# Patient Record
Sex: Female | Born: 1935 | Race: White | Hispanic: No | Smoking: Former smoker
Health system: Southern US, Community
[De-identification: ages and names within clinical notes are randomized; demographics above are authoritative.]

## PROBLEM LIST (undated history)

## (undated) DIAGNOSIS — F32A Depression, unspecified: Secondary | ICD-10-CM

## (undated) DIAGNOSIS — R0600 Dyspnea, unspecified: Secondary | ICD-10-CM

## (undated) DIAGNOSIS — E039 Hypothyroidism, unspecified: Secondary | ICD-10-CM

## (undated) DIAGNOSIS — F329 Major depressive disorder, single episode, unspecified: Secondary | ICD-10-CM

## (undated) DIAGNOSIS — M1990 Unspecified osteoarthritis, unspecified site: Secondary | ICD-10-CM

## (undated) DIAGNOSIS — E785 Hyperlipidemia, unspecified: Secondary | ICD-10-CM

## (undated) DIAGNOSIS — I4891 Unspecified atrial fibrillation: Secondary | ICD-10-CM

## (undated) DIAGNOSIS — F419 Anxiety disorder, unspecified: Secondary | ICD-10-CM

## (undated) DIAGNOSIS — R350 Frequency of micturition: Secondary | ICD-10-CM

## (undated) DIAGNOSIS — R112 Nausea with vomiting, unspecified: Secondary | ICD-10-CM

## (undated) DIAGNOSIS — S22080A Wedge compression fracture of T11-T12 vertebra, initial encounter for closed fracture: Secondary | ICD-10-CM

## (undated) DIAGNOSIS — K219 Gastro-esophageal reflux disease without esophagitis: Secondary | ICD-10-CM

## (undated) DIAGNOSIS — I499 Cardiac arrhythmia, unspecified: Secondary | ICD-10-CM

## (undated) DIAGNOSIS — I10 Essential (primary) hypertension: Secondary | ICD-10-CM

## (undated) HISTORY — DX: Hyperlipidemia, unspecified: E78.5

## (undated) HISTORY — PX: TONSILLECTOMY: SUR1361

## (undated) HISTORY — DX: Nausea with vomiting, unspecified: R11.2

## (undated) HISTORY — PX: COLONOSCOPY: SHX174

## (undated) HISTORY — PX: UPPER GASTROINTESTINAL ENDOSCOPY: SHX188

## (undated) HISTORY — DX: Hypothyroidism, unspecified: E03.9

---

## 1998-07-02 ENCOUNTER — Ambulatory Visit (HOSPITAL_COMMUNITY): Admission: RE | Admit: 1998-07-02 | Discharge: 1998-07-02 | Payer: Self-pay | Admitting: Family Medicine

## 1998-12-13 ENCOUNTER — Other Ambulatory Visit: Admission: RE | Admit: 1998-12-13 | Discharge: 1998-12-13 | Payer: Self-pay | Admitting: Family Medicine

## 1999-11-25 ENCOUNTER — Other Ambulatory Visit: Admission: RE | Admit: 1999-11-25 | Discharge: 1999-11-25 | Payer: Self-pay | Admitting: Family Medicine

## 2000-02-14 ENCOUNTER — Encounter: Payer: Self-pay | Admitting: Family Medicine

## 2000-02-14 ENCOUNTER — Encounter: Admission: RE | Admit: 2000-02-14 | Discharge: 2000-02-14 | Payer: Self-pay | Admitting: Family Medicine

## 2000-07-08 ENCOUNTER — Ambulatory Visit (HOSPITAL_COMMUNITY): Admission: RE | Admit: 2000-07-08 | Discharge: 2000-07-08 | Payer: Self-pay | Admitting: Gastroenterology

## 2000-12-01 ENCOUNTER — Other Ambulatory Visit: Admission: RE | Admit: 2000-12-01 | Discharge: 2000-12-01 | Payer: Self-pay | Admitting: Family Medicine

## 2001-03-17 ENCOUNTER — Encounter: Payer: Self-pay | Admitting: Family Medicine

## 2001-03-17 ENCOUNTER — Encounter: Admission: RE | Admit: 2001-03-17 | Discharge: 2001-03-17 | Payer: Self-pay | Admitting: Family Medicine

## 2001-12-02 ENCOUNTER — Other Ambulatory Visit: Admission: RE | Admit: 2001-12-02 | Discharge: 2001-12-02 | Payer: Self-pay | Admitting: Family Medicine

## 2002-03-18 ENCOUNTER — Encounter: Admission: RE | Admit: 2002-03-18 | Discharge: 2002-03-18 | Payer: Self-pay | Admitting: Family Medicine

## 2002-03-18 ENCOUNTER — Encounter: Payer: Self-pay | Admitting: Family Medicine

## 2002-12-05 ENCOUNTER — Other Ambulatory Visit: Admission: RE | Admit: 2002-12-05 | Discharge: 2002-12-05 | Payer: Self-pay | Admitting: Family Medicine

## 2003-04-20 ENCOUNTER — Emergency Department (HOSPITAL_COMMUNITY): Admission: EM | Admit: 2003-04-20 | Discharge: 2003-04-20 | Payer: Self-pay | Admitting: Emergency Medicine

## 2003-04-20 ENCOUNTER — Encounter: Payer: Self-pay | Admitting: Emergency Medicine

## 2003-06-22 ENCOUNTER — Encounter: Payer: Self-pay | Admitting: Family Medicine

## 2003-06-22 ENCOUNTER — Encounter: Admission: RE | Admit: 2003-06-22 | Discharge: 2003-06-22 | Payer: Self-pay | Admitting: Family Medicine

## 2004-01-18 ENCOUNTER — Encounter: Admission: RE | Admit: 2004-01-18 | Discharge: 2004-01-18 | Payer: Self-pay | Admitting: Family Medicine

## 2004-03-15 ENCOUNTER — Encounter: Admission: RE | Admit: 2004-03-15 | Discharge: 2004-03-15 | Payer: Self-pay | Admitting: Family Medicine

## 2004-06-05 ENCOUNTER — Other Ambulatory Visit: Admission: RE | Admit: 2004-06-05 | Discharge: 2004-06-05 | Payer: Self-pay | Admitting: Family Medicine

## 2004-07-15 ENCOUNTER — Ambulatory Visit (HOSPITAL_COMMUNITY): Admission: RE | Admit: 2004-07-15 | Discharge: 2004-07-15 | Payer: Self-pay | Admitting: Family Medicine

## 2005-06-10 ENCOUNTER — Other Ambulatory Visit: Admission: RE | Admit: 2005-06-10 | Discharge: 2005-06-10 | Payer: Self-pay | Admitting: Family Medicine

## 2005-08-21 ENCOUNTER — Encounter: Admission: RE | Admit: 2005-08-21 | Discharge: 2005-08-21 | Payer: Self-pay | Admitting: Family Medicine

## 2005-09-30 ENCOUNTER — Emergency Department (HOSPITAL_COMMUNITY): Admission: EM | Admit: 2005-09-30 | Discharge: 2005-09-30 | Payer: Self-pay | Admitting: Emergency Medicine

## 2006-04-14 HISTORY — PX: NM MYOCAR PERF WALL MOTION: HXRAD629

## 2006-09-01 ENCOUNTER — Ambulatory Visit: Payer: Self-pay | Admitting: Gastroenterology

## 2006-09-21 ENCOUNTER — Ambulatory Visit: Payer: Self-pay | Admitting: Internal Medicine

## 2006-09-21 ENCOUNTER — Ambulatory Visit (HOSPITAL_COMMUNITY): Admission: RE | Admit: 2006-09-21 | Discharge: 2006-09-21 | Payer: Self-pay | Admitting: Internal Medicine

## 2006-09-21 ENCOUNTER — Encounter (INDEPENDENT_AMBULATORY_CARE_PROVIDER_SITE_OTHER): Payer: Self-pay | Admitting: Specialist

## 2006-10-13 ENCOUNTER — Encounter: Admission: RE | Admit: 2006-10-13 | Discharge: 2006-10-13 | Payer: Self-pay | Admitting: Family Medicine

## 2007-08-22 ENCOUNTER — Emergency Department (HOSPITAL_COMMUNITY): Admission: EM | Admit: 2007-08-22 | Discharge: 2007-08-22 | Payer: Self-pay | Admitting: Emergency Medicine

## 2007-08-31 ENCOUNTER — Ambulatory Visit (HOSPITAL_COMMUNITY): Admission: RE | Admit: 2007-08-31 | Discharge: 2007-08-31 | Payer: Self-pay | Admitting: Family Medicine

## 2007-10-20 ENCOUNTER — Ambulatory Visit (HOSPITAL_COMMUNITY): Admission: RE | Admit: 2007-10-20 | Discharge: 2007-10-20 | Payer: Self-pay | Admitting: Family Medicine

## 2007-11-12 ENCOUNTER — Encounter: Admission: RE | Admit: 2007-11-12 | Discharge: 2007-11-12 | Payer: Self-pay | Admitting: Family Medicine

## 2007-12-03 ENCOUNTER — Ambulatory Visit (HOSPITAL_COMMUNITY): Admission: RE | Admit: 2007-12-03 | Discharge: 2007-12-03 | Payer: Self-pay | Admitting: Family Medicine

## 2007-12-07 ENCOUNTER — Ambulatory Visit (HOSPITAL_COMMUNITY): Admission: RE | Admit: 2007-12-07 | Discharge: 2007-12-07 | Payer: Self-pay | Admitting: Family Medicine

## 2008-07-31 ENCOUNTER — Other Ambulatory Visit: Admission: RE | Admit: 2008-07-31 | Discharge: 2008-07-31 | Payer: Self-pay | Admitting: Obstetrics & Gynecology

## 2009-03-22 ENCOUNTER — Emergency Department (HOSPITAL_COMMUNITY): Admission: EM | Admit: 2009-03-22 | Discharge: 2009-03-23 | Payer: Self-pay | Admitting: Emergency Medicine

## 2009-04-03 ENCOUNTER — Ambulatory Visit (HOSPITAL_COMMUNITY): Admission: RE | Admit: 2009-04-03 | Discharge: 2009-04-03 | Payer: Self-pay | Admitting: Family Medicine

## 2009-09-04 ENCOUNTER — Ambulatory Visit (HOSPITAL_COMMUNITY): Admission: RE | Admit: 2009-09-04 | Discharge: 2009-09-04 | Payer: Self-pay | Admitting: Advanced Practice Midwife

## 2009-09-06 ENCOUNTER — Ambulatory Visit (HOSPITAL_COMMUNITY): Admission: RE | Admit: 2009-09-06 | Discharge: 2009-09-06 | Payer: Self-pay | Admitting: Family Medicine

## 2009-09-18 ENCOUNTER — Other Ambulatory Visit: Admission: RE | Admit: 2009-09-18 | Discharge: 2009-09-18 | Payer: Self-pay | Admitting: Obstetrics & Gynecology

## 2010-03-04 DIAGNOSIS — K219 Gastro-esophageal reflux disease without esophagitis: Secondary | ICD-10-CM | POA: Insufficient documentation

## 2010-03-04 DIAGNOSIS — E785 Hyperlipidemia, unspecified: Secondary | ICD-10-CM | POA: Insufficient documentation

## 2010-03-04 DIAGNOSIS — E7800 Pure hypercholesterolemia, unspecified: Secondary | ICD-10-CM | POA: Insufficient documentation

## 2010-03-04 DIAGNOSIS — F411 Generalized anxiety disorder: Secondary | ICD-10-CM | POA: Insufficient documentation

## 2010-03-04 DIAGNOSIS — Z8719 Personal history of other diseases of the digestive system: Secondary | ICD-10-CM | POA: Insufficient documentation

## 2010-03-05 ENCOUNTER — Ambulatory Visit: Payer: Self-pay | Admitting: Gastroenterology

## 2010-03-05 DIAGNOSIS — R1032 Left lower quadrant pain: Secondary | ICD-10-CM | POA: Insufficient documentation

## 2010-06-26 ENCOUNTER — Encounter (INDEPENDENT_AMBULATORY_CARE_PROVIDER_SITE_OTHER): Payer: Self-pay

## 2010-08-14 ENCOUNTER — Ambulatory Visit (HOSPITAL_COMMUNITY): Admission: RE | Admit: 2010-08-14 | Discharge: 2010-08-14 | Payer: Self-pay | Admitting: Internal Medicine

## 2010-08-22 ENCOUNTER — Ambulatory Visit: Payer: Self-pay | Admitting: Gastroenterology

## 2010-08-22 DIAGNOSIS — R1310 Dysphagia, unspecified: Secondary | ICD-10-CM | POA: Insufficient documentation

## 2010-08-27 ENCOUNTER — Inpatient Hospital Stay (HOSPITAL_COMMUNITY): Admission: EM | Admit: 2010-08-27 | Discharge: 2010-08-28 | Payer: Self-pay | Admitting: Emergency Medicine

## 2010-08-27 ENCOUNTER — Ambulatory Visit: Payer: Self-pay | Admitting: Family Medicine

## 2010-09-12 ENCOUNTER — Ambulatory Visit: Payer: Self-pay | Admitting: Family Medicine

## 2010-09-12 DIAGNOSIS — I4891 Unspecified atrial fibrillation: Secondary | ICD-10-CM | POA: Insufficient documentation

## 2010-09-12 DIAGNOSIS — R631 Polydipsia: Secondary | ICD-10-CM | POA: Insufficient documentation

## 2010-09-30 ENCOUNTER — Encounter (INDEPENDENT_AMBULATORY_CARE_PROVIDER_SITE_OTHER): Payer: Self-pay | Admitting: *Deleted

## 2010-12-24 ENCOUNTER — Inpatient Hospital Stay (HOSPITAL_COMMUNITY): Admission: AD | Admit: 2010-12-24 | Discharge: 2010-12-25 | Payer: Self-pay | Source: Home / Self Care

## 2010-12-24 ENCOUNTER — Ambulatory Visit (HOSPITAL_COMMUNITY)
Admission: RE | Admit: 2010-12-24 | Discharge: 2010-12-24 | Payer: Self-pay | Source: Home / Self Care | Attending: Family Medicine | Admitting: Family Medicine

## 2010-12-30 LAB — CBC
HCT: 38.4 % (ref 36.0–46.0)
Hemoglobin: 13.2 g/dL (ref 12.0–15.0)
MCH: 30.2 pg (ref 26.0–34.0)
MCHC: 34.4 g/dL (ref 30.0–36.0)
MCV: 87.9 fL (ref 78.0–100.0)
Platelets: 269 10*3/uL (ref 150–400)
RBC: 4.37 MIL/uL (ref 3.87–5.11)
RDW: 13.5 % (ref 11.5–15.5)
WBC: 7.7 10*3/uL (ref 4.0–10.5)

## 2010-12-30 LAB — DIFFERENTIAL
Basophils Absolute: 0 10*3/uL (ref 0.0–0.1)
Basophils Relative: 0 % (ref 0–1)
Eosinophils Absolute: 0.2 10*3/uL (ref 0.0–0.7)
Eosinophils Relative: 2 % (ref 0–5)
Lymphocytes Relative: 29 % (ref 12–46)
Lymphs Abs: 2.2 10*3/uL (ref 0.7–4.0)
Monocytes Absolute: 0.5 10*3/uL (ref 0.1–1.0)
Monocytes Relative: 6 % (ref 3–12)
Neutro Abs: 4.8 10*3/uL (ref 1.7–7.7)
Neutrophils Relative %: 63 % (ref 43–77)

## 2010-12-30 LAB — EXPECTORATED SPUTUM ASSESSMENT W REFEX TO RESP CULTURE

## 2010-12-30 LAB — TSH: TSH: 2.134 u[IU]/mL (ref 0.350–4.500)

## 2010-12-30 LAB — CULTURE, BLOOD (ROUTINE X 2)
Culture: NO GROWTH
Culture: NO GROWTH

## 2010-12-30 LAB — COMPREHENSIVE METABOLIC PANEL
ALT: 12 U/L (ref 0–35)
AST: 20 U/L (ref 0–37)
Albumin: 3.8 g/dL (ref 3.5–5.2)
Alkaline Phosphatase: 91 U/L (ref 39–117)
BUN: 12 mg/dL (ref 6–23)
CO2: 29 mEq/L (ref 19–32)
Calcium: 9.3 mg/dL (ref 8.4–10.5)
Chloride: 105 mEq/L (ref 96–112)
Creatinine, Ser: 0.82 mg/dL (ref 0.4–1.2)
GFR calc Af Amer: >60 mL/min (ref >60)
GFR calc non Af Amer: >60 mL/min (ref >60)
Glucose, Bld: 92 mg/dL (ref 70–99)
Potassium: 4.2 mEq/L (ref 3.5–5.1)
Sodium: 141 mEq/L (ref 135–145)
Total Bilirubin: 0.8 mg/dL (ref 0.3–1.2)
Total Protein: 7.4 g/dL (ref 6.0–8.3)

## 2010-12-30 LAB — LEGIONELLA ANTIGEN, URINE: Legionella Antigen, Urine: NEGATIVE

## 2010-12-30 LAB — APTT: aPTT: 50 seconds — ABNORMAL HIGH (ref 24–37)

## 2010-12-30 LAB — STREP PNEUMONIAE URINARY ANTIGEN: Strep Pneumo Urinary Antigen: NEGATIVE

## 2010-12-30 LAB — EXPECTORATED SPUTUM ASSESSMENT W GRAM STAIN, RFLX TO RESP C

## 2010-12-30 LAB — PROTIME-INR
INR: 2.02 — ABNORMAL HIGH (ref 0.00–1.49)
INR: 2.19 — ABNORMAL HIGH (ref 0.00–1.49)
Prothrombin Time: 23 seconds — ABNORMAL HIGH (ref 11.6–15.2)
Prothrombin Time: 24.5 seconds — ABNORMAL HIGH (ref 11.6–15.2)

## 2010-12-30 LAB — CULTURE, RESPIRATORY W GRAM STAIN: Culture: NORMAL

## 2010-12-30 LAB — MRSA PCR SCREENING: MRSA by PCR: NEGATIVE

## 2011-01-05 ENCOUNTER — Encounter: Payer: Self-pay | Admitting: Family Medicine

## 2011-01-16 NOTE — Assessment & Plan Note (Signed)
Summary: GERD, DYSPHAGIA   Visit Type:  Follow-up Visit Primary Care Provider:  [name], M.D.  Chief Complaint:  knot on stomach.  History of Present Illness: Went to beach and had vomiting. ?got food poisoning and haven't felt good since. Bruise on side of stomach and has a black eye and a knot. Hit her head and side  ~2 weeks. Felt nauseous his AM. Nothing taste right. Saw Dr. [name] and Gerarda Fraction. Last vomited: SUN. No diarrhea since 2 weeks. Last BM:? Took a laxative. Still on Coumadin and ASA.  Pt wanted me to look at her bruises.  Current Medications (verified): 1)  Zocor 10 Mg Tabs (Simvastatin) .... Once Daily 2)  Aspirin 81 Mg Tabs (Aspirin) .... Once Daily As Needed 3)  Omeprazole 20 Mg Cpdr (Omeprazole) .... Once Daily 4)  Lorazepam 0.5 Mg Tabs (Lorazepam) .... Once Daily 5)  Lidoderm 5 % Ptch (Lidocaine) .... Top Q12h. Remove After 12 Hours. 6)  Metoprolol Succinate 25 Mg Xr24h-Tab (Metoprolol Succinate) .... Once Daily 7)  Levothyroxine Sodium 50 Mcg Tabs (Levothyroxine Sodium) .... Once Daily 8)  Coumadin 3 Mg Tabs (Warfarin Sodium) .... Uad 9)  Simvastatin 40 Mg Tabs (Simvastatin) .... Once Daily 10)  Cephalexin 500 Mg Caps (Cephalexin) .... Qid  Allergies (verified): 1)  ! Sulfa 2)  ! Codeine  Past History:  Past Medical History: Last updated: 03/05/2010 GERD Hyperlipidemia TCS 2007: simple adenoma, DC/[place] DIVERTICULOSIS Dysphagia-EGD/dilation 2007-NUR 54 Fr Maloney  Past Surgical History: Last updated: 03/04/2010 TONSILLECTOMY  Vital Signs:  Patient profile:   77 year old female Height:      64 inches Weight:      150 pounds BMI:     25.84 Temp:     97.8 degrees F oral Pulse rate:   60 / minute BP sitting:   128 / 78  (right arm) Cuff size:   regular  Vitals Entered By: Burnadette Peter LPN (September  8, 624THL 2:59 PM)  Physical Exam  General:  Well developed, well nourished, no acute distress. Head:  Normocephalic, ecchymosis and TTP on R fronatl  scalp and R eye Eyes:  PERRL, no icterus. Lungs:  Clear throughout to auscultation. Heart:  Regular rate and rhythm; no murmurs. Abdomen:  Soft, nontender and nondistended. Normal bowel sounds. Old bruise on rRLQ Msk:  Old cruise on RUE Extremities:  No edema noted. Neurologic:  Alert and  oriented x4;  grossly normal neurologically.  Impression & Recommendations:  Problem # 1:  GERD (ICD-530.81) Assessment Improved Continue OMP. OPV in 6 mos.  Problem # 2:  DYSPHAGIA (ICD-787.20) Assessment: Improved No indication for EGD.  CC: PCP  Appended Document: GERD, DYSPHAGIA 6 MONTH F/U OPV IS IN THE COMPUTER  Appended Document: Orders Update    Clinical Lists Changes  Orders: Added new Service order of Est. Patient Level II [id number]) - Signed

## 2011-01-16 NOTE — Letter (Signed)
Summary: Unable to Reach, Consult Scheduled  [facility]  [address]   [place], [place] [address]   Phone: [phone]  Fax: [phone]    09/30/2010  Leslie Rosario [address] [place], [place]  [address] [birth year]   Dear Ms. Stoltz,   We have been unable to reach you by phone.  Please contact our office with an updated phone number.  At the recommendation of DR [facility]  we have been asked to schedule you a consult with DR FIELDS for GERD/ACID REFLUX.   Please call our office at [phone].     Thank you,    [facility] R. Garfield Cornea, M.D.    Barney Drain, M.D. Vickey Huger, FNP-BC    Neil Crouch, PA-C Phone: [phone]    Fax: [phone]

## 2011-01-16 NOTE — Letter (Signed)
Summary: Recall Office Visit  [facility]  [address]   [place], [place] [address]   Phone: [phone]  Fax: [phone]      June 26, 2010   Leslie Rosario [address] [place], [place]  [address] 13-Feb-1923   Dear Ms. Lusty,   According to our records, it is time for you to schedule a follow-up office visit with [name].   At your convenience, please call [phone] to schedule an office visit. If you have any questions, concerns, or feel that this letter is in error, we would appreciate your call.   Sincerely,    Burnadette Peter LPN  [facility] Ph: [phone]   Fax: [phone]

## 2011-01-16 NOTE — Assessment & Plan Note (Signed)
Summary: hfu,df   Vital Signs:  Patient profile:   77 year old female Height:      64 inches Weight:      142.9 pounds BMI:     24.62 Temp:     98.0 degrees F oral Pulse rate:   69 / minute BP sitting:   113 / 57  (left arm) Cuff size:   regular  Vitals Entered By: Levert Feinstein LPN (September 29, 624THL 2:48 PM) CC: hfu Is Patient Diabetic? No Pain Assessment Patient in pain? no        Primary Provider:  [name], M.D.  CC:  hfu.  History of Present Illness: pt. is an 77 y/o c/f with history of syncope from vasovagal response. Patient suffered a two weak long likely viral illness with vomiting and diarrhea. she was seen in the hospital because of syncopal episode and nasal fracture. patient has a medical history of atrial fibrilliation on coumadin. she looks good today. did not require any refills. her nose is healing well.  Preventive Screening-Counseling & Management  Alcohol-Tobacco     Smoking Status: never  Problems Prior to Update: 1)  Polydipsia  (ICD-783.5) 2)  Dysphagia  (ICD-787.20) 3)  Abdominal Pain, Left Lower Quadrant  (ICD-789.04) 4)  Hiatal Hernia, Hx of  (ICD-V12.79) 5)  Hypercholesterolemia  (ICD-272.0) 6)  Diverticulosis  () 7)  Hyperlipidemia  (ICD-272.4) 8)  Hx of Anxiety  (ICD-300.00) 9)  Gerd  (ICD-530.81)  Current Problems (verified): 1)  Atrial Fibrillation  (ICD-427.31) 2)  Polydipsia  (ICD-783.5) 3)  Dysphagia  (ICD-787.20) 4)  Abdominal Pain, Left Lower Quadrant  (ICD-789.04) 5)  Hiatal Hernia, Hx of  (ICD-V12.79) 6)  Hypercholesterolemia  (ICD-272.0) 7)  Diverticulosis  () 8)  Hyperlipidemia  (ICD-272.4) 9)  Hx of Anxiety  (ICD-300.00) 10)  Gerd  (ICD-530.81)  Medications Prior to Update: 1)  Zocor 10 Mg Tabs (Simvastatin) .... Once Daily 2)  Aspirin 81 Mg Tabs (Aspirin) .... Once Daily As Needed 3)  Omeprazole 20 Mg Cpdr (Omeprazole) .... Once Daily 4)  Lorazepam 0.5 Mg Tabs (Lorazepam) .... Once Daily 5)  Lidoderm 5 % Ptch  (Lidocaine) .... Top Q12h. Remove After 12 Hours. 6)  Metoprolol Succinate 25 Mg Xr24h-Tab (Metoprolol Succinate) .... Once Daily 7)  Levothyroxine Sodium 50 Mcg Tabs (Levothyroxine Sodium) .... Once Daily 8)  Coumadin 3 Mg Tabs (Warfarin Sodium) .... Uad 9)  Simvastatin 40 Mg Tabs (Simvastatin) .... Once Daily 10)  Cephalexin 500 Mg Caps (Cephalexin) .... Qid  Current Medications (verified): 1)  Zocor 10 Mg Tabs (Simvastatin) .... Once Daily 2)  Aspirin 81 Mg Tabs (Aspirin) .... Once Daily As Needed 3)  Omeprazole 20 Mg Cpdr (Omeprazole) .... Once Daily 4)  Lorazepam 0.5 Mg Tabs (Lorazepam) .... Once Daily 5)  Lidoderm 5 % Ptch (Lidocaine) .... Top Q12h. Remove After 12 Hours. 6)  Metoprolol Succinate 25 Mg Xr24h-Tab (Metoprolol Succinate) .... Once Daily 7)  Levothyroxine Sodium 50 Mcg Tabs (Levothyroxine Sodium) .... Once Daily 8)  Coumadin 3 Mg Tabs (Warfarin Sodium) .... Uad 9)  Simvastatin 40 Mg Tabs (Simvastatin) .... Once Daily 10)  Cephalexin 500 Mg Caps (Cephalexin) .... Qid  Allergies (verified): 1)  ! Sulfa 2)  ! Codeine  Past History:  Past Surgical History: Last updated: 03/04/2010 TONSILLECTOMY  Family History: Last updated: 03/05/2010 No family history of polyps. Niece had colon CA. Mother died of female [place].  Social History: Last updated: 03/05/2010 Lost husband Aug 23, 2008. Married almost 26 yrs. Retired from  [employer] 25 years Raised on a farm. No cigs, quit: 25 yrs ago. Alcohol Use - no  Risk Factors: Smoking Status: never (09/12/2010)  Past Medical History: GERD Hyperlipidemia TCS 2007: simple adenoma, DC/[place] DIVERTICULOSIS Dysphagia-EGD/dilation 2007-NUR 54 Fr Maloney AFIB ON COUMADIN  Family History: Reviewed history from 03/05/2010 and no changes required. No family history of polyps. Niece had colon CA. Mother died of female [place].  Social History: Reviewed history from 03/05/2010 and no changes required. Lost husband Aug 23, 2008. Married  almost 84 yrs. Retired from [employer] 25 years Raised on a farm. No cigs, quit: 25 yrs ago. Alcohol Use - no Smoking Status:  never  Physical Exam  General:  alert, well-developed, and well-nourished.   Head:  normocephalic and atraumatic.   Eyes:  vision grossly intact.   Ears:  R ear normal and L ear normal.   Nose:  external deformity.  healing laceration from fall  Neck:  supple.   Chest Wall:  no deformities and no tenderness.   Lungs:  Normal respiratory effort, chest expands symmetrically. Lungs are clear to auscultation, no crackles or wheezes. Heart:  Normal rate and regular rhythm. S1 and S2 normal without gallop, murmur, click, rub or other extra sounds. Abdomen:  Bowel sounds positive,abdomen soft and non-tender without masses, organomegaly or hernias noted. Neurologic:  No cranial nerve deficits noted. Station and gait are normal. Plantar reflexes are down-going bilaterally. DTRs are symmetrical throughout. Sensory, motor and coordinative functions appear intact.   Impression & Recommendations:  Problem # 1:  ATRIAL FIBRILLATION (ICD-427.31) sinus. follows with cardiologist  Her updated medication list for this problem includes:    Aspirin 81 Mg Tabs (Aspirin) ..... Once daily as needed    Metoprolol Succinate 25 Mg Xr24h-tab (Metoprolol succinate) ..... Once daily    Coumadin 3 Mg Tabs (Warfarin sodium) ..... Uad  Problem # 2:  GERD (ICD-530.81)  Her updated medication list for this problem includes:    Omeprazole 20 Mg Cpdr (Omeprazole) ..... Once daily  Complete Medication List: 1)  Zocor 10 Mg Tabs (Simvastatin) .... Once daily 2)  Aspirin 81 Mg Tabs (Aspirin) .... Once daily as needed 3)  Omeprazole 20 Mg Cpdr (Omeprazole) .... Once daily 4)  Lorazepam 0.5 Mg Tabs (Lorazepam) .... Once daily 5)  Lidoderm 5 % Ptch (Lidocaine) .... Top q12h. remove after 12 hours. 6)  Metoprolol Succinate 25 Mg Xr24h-tab (Metoprolol succinate) .... Once daily 7)  Levothyroxine  Sodium 50 Mcg Tabs (Levothyroxine sodium) .... Once daily 8)  Coumadin 3 Mg Tabs (Warfarin sodium) .... Uad 9)  Simvastatin 40 Mg Tabs (Simvastatin) .... Once daily 10)  Cephalexin 500 Mg Caps (Cephalexin) .... Qid  Patient Instructions: 1)  Ms. Tull, It was really great to see you again. I hope you come back and see me soon. 2)  Please schedule a follow-up appointment in 6 months. 3)  It is important that you exercise regularly at least 20 minutes 5 times a week. If you develop chest pain, have severe difficulty breathing, or feel very tired , stop exercising immediately and seek medical attention. 4)  Take an Aspirin every day.

## 2011-01-16 NOTE — Assessment & Plan Note (Signed)
Summary: ABD PAIN, DIVERITICULOSIS   Visit Type:  Initial Consult Primary Care Provider:  [name], M.D.  Chief Complaint:  abd pain.  History of Present Illness: Leslie Rosario seem in 2007 for same pain, 145 lbs. Last seen by NUR 2009 for same pain:  148 lbs. Occurs 2-3x/year. Burning sensation on left side and Dr. Lindi Adie, NP-c gave her Lidoderm patches and pain resolves. Onset: random. Notices it during the day. Always in the same place. No Hx: shingles.  Sometimes bowels: has good Bm and some days no good BM, and then go 2-3 times in a day and she wipes and wipes. No blood in stool. No black tarry stool. No fever, chills, nausea, vomiting, or weight loss. Not affected by food, passing gas, or stool. Sometimes uses a suppository because she feels like she needs to go so she can go to [place] before leaving home. No problems swallowing. Having back pain after digging up soil. Rx: heating pad.  Current Medications (verified): 1)  Zocor 10 Mg Tabs (Simvastatin) .... Once Daily 2)  Aspirin 81 Mg Tabs (Aspirin) .... Once Daily As Needed 3)  Fish Oil 1000 Mg Caps (Omega-3 Fatty Acids) .... Once Daily 4)  Tylenol Extra Strength 500 Mg Tabs (Acetaminophen) .... Take 1/2 Tablet Everyday As Needed 5)  Omeprazole 20 Mg Cpdr (Omeprazole) .... Once Daily 6)  Lorazepam 0.5 Mg Tabs (Lorazepam) .... Once Daily 7)  Oscal 500/200 D-3 500-200 Mg-Unit Tabs (Calcium-Vitamin D) .... Once Daily 8)  Lidoderm 5 % Ptch (Lidocaine) .... As Needed  Allergies (verified): 1)  ! Sulfa 2)  ! Codeine  Past History:  Past Medical History: GERD Hyperlipidemia TCS 2007: simple adenoma, DC/[place] DIVERTICULOSIS Dysphagia-EGD/dilation 2007-NUR 54 Fr Maloney  Family History: No family history of polyps. Niece had colon CA. Mother died of female [place].  Social History: Lost husband Aug 23, 2008. Married almost 67 yrs. Retired from [employer] 25 years Raised on a farm. No cigs, quit: 25 yrs ago. Alcohol Use - no  Review of  Systems       JULY 2009: 142 LBS NOV 2010: 137 LBS  Vital Signs:  Patient profile:   76 year old female Height:      64 inches Weight:      147 pounds Temp:     97.9 degrees F oral Pulse rate:   60 / minute BP sitting:   124 / 82  (left arm) Cuff size:   regular  Vitals Entered By: Burnadette Peter LPN (March 22, 624THL 624THL AM)  Physical Exam  General:  Well developed, well nourished, no acute distress. Head:  Normocephalic and atraumatic. Eyes:  PERRLA, no icterus. Mouth:  No deformity or lesions. Neck:  Supple; no masses. Lungs:  Clear throughout to auscultation. Heart:  Regular rate and rhythm; no murmurs. Abdomen:  Soft, nontender and nondistended. Normal bowel sounds. Extremities:  No edema or deformities noted. Neurologic:  Alert and  oriented x4;  grossly normal neurologically.  Impression & Recommendations:  Problem # 1:  ABDOMINAL PAIN, LEFT LOWER QUADRANT (ICD-789.04) Assessment Unchanged  Most likely 2o to functional abd pain/diverticulosis. Continue fiber daily and increase to two times a day. OPV in 4 months. Refilled Ativan and Lidoderm patches.  CC: PCP  Orders: New Patient Level III [id number]  Patient Instructions: 1)  ADD ONE FIBER PILL DAILY FOR 7 DAYS. 2)  INCREASE FIBER PILLS TO TWICE DAILY ON MAR 29. 3)  USE LIDODERM PATCHES AS NEEDED. 4)  RETURN VISIT IN 4 MONTHS. 5)  The medication list was reviewed and reconciled.  All changed / newly prescribed medications were explained.  A complete medication list was provided to the patient / caregiver. Prescriptions: LORAZEPAM 0.5 MG TABS (LORAZEPAM) once daily  #30 x 2   Entered and Authorized by:   [name]   Signed by:   [name] on 03/05/2010   Method used:   Print then Give to Patient   RxID:   [id number] LIDODERM 5 % PTCH (LIDOCAINE) top q12h. Remove after 12 hours.  #30 x 5   Entered and Authorized by:   [name]   Signed by:   [name] on 03/05/2010    Method used:   Electronically to        [facility] (retail)       [place] [address] [place], [place]  [address]       Ph: [phone]       Fax: [phone]   RxID:   [id number]

## 2011-02-05 ENCOUNTER — Encounter (INDEPENDENT_AMBULATORY_CARE_PROVIDER_SITE_OTHER): Payer: Self-pay | Admitting: *Deleted

## 2011-02-11 NOTE — Letter (Signed)
Summary: Recall Office Visit  [facility]  [address]   [place], [place] [address]   Phone: [phone]  Fax: [phone]      February 05, 2011   Leslie Rosario [address] [place], [place]  [address] [birth year]   Dear Ms. Rosengren,   According to our records, it is time for you to schedule a follow-up office visit with [name].   At your convenience, please call [phone] to schedule an office visit. If you have any questions, concerns, or feel that this letter is in error, we would appreciate your call.   Sincerely,    [facility] Ph: [phone]   Fax: [phone]

## 2011-02-24 ENCOUNTER — Other Ambulatory Visit (HOSPITAL_COMMUNITY): Payer: Self-pay | Admitting: Internal Medicine

## 2011-02-24 DIAGNOSIS — G459 Transient cerebral ischemic attack, unspecified: Secondary | ICD-10-CM

## 2011-02-26 ENCOUNTER — Ambulatory Visit (HOSPITAL_COMMUNITY)
Admission: RE | Admit: 2011-02-26 | Discharge: 2011-02-26 | Disposition: A | Discharge status: Home / Self Care | Payer: Medicare Other | Source: Ambulatory Visit | Attending: Internal Medicine | Admitting: Internal Medicine

## 2011-02-26 DIAGNOSIS — Z7901 Long term (current) use of anticoagulants: Secondary | ICD-10-CM | POA: Insufficient documentation

## 2011-02-26 DIAGNOSIS — G458 Other transient cerebral ischemic attacks and related syndromes: Secondary | ICD-10-CM | POA: Insufficient documentation

## 2011-02-26 DIAGNOSIS — G459 Transient cerebral ischemic attack, unspecified: Secondary | ICD-10-CM

## 2011-02-26 DIAGNOSIS — R110 Nausea: Secondary | ICD-10-CM | POA: Insufficient documentation

## 2011-02-27 LAB — CBC
HCT: 32.2 % — ABNORMAL LOW (ref 36.0–46.0)
HCT: 34.4 % — ABNORMAL LOW (ref 36.0–46.0)
Hemoglobin: 10.8 g/dL — ABNORMAL LOW (ref 12.0–15.0)
Hemoglobin: 11.3 g/dL — ABNORMAL LOW (ref 12.0–15.0)
MCH: 30.3 pg (ref 26.0–34.0)
MCH: 30.7 pg (ref 26.0–34.0)
MCHC: 32.8 g/dL (ref 30.0–36.0)
MCHC: 33.5 g/dL (ref 30.0–36.0)
MCV: 91.5 fL (ref 78.0–100.0)
MCV: 92.2 fL (ref 78.0–100.0)
Platelets: 315 10*3/uL (ref 150–400)
Platelets: 327 10*3/uL (ref 150–400)
RBC: 3.52 MIL/uL — ABNORMAL LOW (ref 3.87–5.11)
RBC: 3.73 MIL/uL — ABNORMAL LOW (ref 3.87–5.11)
RDW: 13.1 % (ref 11.5–15.5)
RDW: 13.1 % (ref 11.5–15.5)
WBC: 8.2 10*3/uL (ref 4.0–10.5)
WBC: 9.7 10*3/uL (ref 4.0–10.5)

## 2011-02-27 LAB — DIFFERENTIAL
Basophils Absolute: 0 10*3/uL (ref 0.0–0.1)
Basophils Relative: 0 % (ref 0–1)
Eosinophils Absolute: 0.2 10*3/uL (ref 0.0–0.7)
Eosinophils Relative: 3 % (ref 0–5)
Lymphocytes Relative: 17 % (ref 12–46)
Lymphs Abs: 1.7 10*3/uL (ref 0.7–4.0)
Monocytes Absolute: 0.6 10*3/uL (ref 0.1–1.0)
Monocytes Relative: 6 % (ref 3–12)
Neutro Abs: 7.2 10*3/uL (ref 1.7–7.7)
Neutrophils Relative %: 74 % (ref 43–77)

## 2011-02-27 LAB — CARDIAC PANEL(CRET KIN+CKTOT+MB+TROPI)
CK, MB: 1 ng/mL (ref 0.3–4.0)
Relative Index: INVALID (ref 0.0–2.5)
Total CK: 92 U/L (ref 7–177)
Troponin I: 0.01 ng/mL (ref 0.00–0.06)

## 2011-02-27 LAB — COMPREHENSIVE METABOLIC PANEL
ALT: 13 U/L (ref 0–35)
AST: 20 U/L (ref 0–37)
Albumin: 3.1 g/dL — ABNORMAL LOW (ref 3.5–5.2)
Alkaline Phosphatase: 72 U/L (ref 39–117)
BUN: 8 mg/dL (ref 6–23)
CO2: 28 mEq/L (ref 19–32)
Calcium: 8.8 mg/dL (ref 8.4–10.5)
Chloride: 107 mEq/L (ref 96–112)
Creatinine, Ser: 0.96 mg/dL (ref 0.4–1.2)
GFR calc Af Amer: >60 mL/min (ref >60)
GFR calc non Af Amer: 55 mL/min — ABNORMAL LOW (ref >60)
Glucose, Bld: 120 mg/dL — ABNORMAL HIGH (ref 70–99)
Potassium: 4.2 mEq/L (ref 3.5–5.1)
Sodium: 143 mEq/L (ref 135–145)
Total Bilirubin: 0.6 mg/dL (ref 0.3–1.2)
Total Protein: 6.7 g/dL (ref 6.0–8.3)

## 2011-02-27 LAB — URINALYSIS, ROUTINE W REFLEX MICROSCOPIC
Bilirubin Urine: NEGATIVE
Glucose, UA: NEGATIVE mg/dL
Hgb urine dipstick: NEGATIVE
Ketones, ur: NEGATIVE mg/dL
Nitrite: NEGATIVE
Protein, ur: NEGATIVE mg/dL
Specific Gravity, Urine: 1.006 (ref 1.005–1.030)
Urobilinogen, UA: 0.2 mg/dL (ref 0.0–1.0)
pH: 7.5 (ref 5.0–8.0)

## 2011-02-27 LAB — PROTIME-INR
INR: 1.67 — ABNORMAL HIGH (ref 0.00–1.49)
INR: 1.8 — ABNORMAL HIGH (ref 0.00–1.49)
Prothrombin Time: 19.9 seconds — ABNORMAL HIGH (ref 11.6–15.2)
Prothrombin Time: 21.1 seconds — ABNORMAL HIGH (ref 11.6–15.2)

## 2011-02-27 LAB — BASIC METABOLIC PANEL
BUN: 7 mg/dL (ref 6–23)
CO2: 26 mEq/L (ref 19–32)
Calcium: 8.1 mg/dL — ABNORMAL LOW (ref 8.4–10.5)
Chloride: 103 mEq/L (ref 96–112)
Creatinine, Ser: 0.89 mg/dL (ref 0.4–1.2)
GFR calc Af Amer: >60 mL/min (ref >60)
GFR calc non Af Amer: >60 mL/min (ref >60)
Glucose, Bld: 126 mg/dL — ABNORMAL HIGH (ref 70–99)
Potassium: 3.9 mEq/L (ref 3.5–5.1)
Sodium: 134 mEq/L — ABNORMAL LOW (ref 135–145)

## 2011-02-27 LAB — URINE MICROSCOPIC-ADD ON

## 2011-02-27 LAB — TROPONIN I: Troponin I: 0.01 ng/mL (ref 0.00–0.06)

## 2011-02-27 LAB — POCT CARDIAC MARKERS
CKMB, poc: <1 ng/mL — ABNORMAL LOW (ref 1.0–8.0)
Myoglobin, poc: 182 ng/mL (ref 12–200)
Troponin i, poc: <0.05 ng/mL (ref 0.00–0.09)

## 2011-02-27 LAB — CK TOTAL AND CKMB (NOT AT ARMC)
CK, MB: 1.4 ng/mL (ref 0.3–4.0)
Relative Index: 1.4 (ref 0.0–2.5)
Total CK: 103 U/L (ref 7–177)

## 2011-02-27 LAB — TSH: TSH: 2.299 u[IU]/mL (ref 0.350–4.500)

## 2011-05-02 NOTE — Consult Note (Signed)
NAME:  Leslie Rosario, Leslie Rosario                            ACCOUNT NO.:  [id number]   MEDICAL RECORD NO.:  [id number]                   PATIENT TYPE:  EMS   LOCATION:  ED                                   FACILITY:  [facility]   PHYSICIAN:  J. Sanjuana Kava, M.D.              DATE OF BIRTH:  March 24, 1923   DATE OF CONSULTATION:  DATE OF DISCHARGE:                                   CONSULTATION   REFERRING PHYSICIAN:  Dr. Ivy Lynn asked [name] to see the patient in the ER.   HISTORY OF PRESENT ILLNESS:  The patient woke up at 2 o'clock this morning.  She could not sleep.  She was going to go into another room.  When she got  there her husband had a ladder up, she hit the ladder, fell, and landed on  her left arm.  She dislocated her left elbow.  She was seen and evaluated by  Dr. Ivy Lynn, see his notes, nurses notes on the chart.  They are incorporated  by reference and are not repeated.  Patient had deformity of left arm,  inability to use it.  No other injury.  No head injury.   CURRENT MEDICATIONS:  The patient is currently taking Xanax and something  for cholesterol.   PAST SURGICAL HISTORY:  Her only surgery is for tonsillectomy years ago.   ALLERGIES:  She is allergic to SULFA.   REVIEW OF SYSTEMS:  Essentially negative.  She says that she is an insomniac  and she takes Xanax tonight to try to sleep and it did not help.   SOCIAL HISTORY:  Dr. Hilma Favors is her family doctor.   PHYSICAL EXAMINATION:  VITAL SIGNS:  The patient's vital signs are normal.  GENERAL:  She is alert, cooperative, oriented, and in pain.  HEENT:  Negative.  NECK:  Supple.  LUNGS:  Clear to P&A.  HEART:  Regular without murmurs.  ABDOMEN:  Soft, tender, without masses.  EXTREMITIES:  Left elbow has deformity, but neurovascular is intact of the  hand.  There is no ulnar nerve problems.  Radial nerve functions are intact.  The extremities are within normal limits.  CENTRAL NERVOUS SYSTEM:  Intact.  SKIN: Intact.   IMPRESSION:  Dislocation left elbow.   DESCRIPTION OF PROCEDURE:  The patient was explained about the procedure as  was her husband who is present that I could make conscious sedation and  closed reduction.  The patient signed the permit.   IV had already been placed.  Slowly IV Versed was given.  I gave a total of  10 IV slowly over a period of time recorded by the nursed.  Closed reduction  was carried out.  Posterior splint was then applied.  We are waiting for an  x-ray.   DISCHARGE INSTRUCTIONS:  1. The patient will stay in the emergency room for a while until the Versed  slowly wears off. She will be closely monitored according to conscious     sedation protocol.  2. She is stable in the posterior splint.  3. Prescription for Darvocet-N 100 to be given.  4. I will see the patient in the office tomorrow, Friday.  It is 5:15 in the     morning and these precautions have been explained to the patient's     husband.                                               Iona Hansen, M.D.    JWK/MEDQ  D:  04/20/2003  T:  04/20/2003  Job:  [id number]

## 2011-05-02 NOTE — Op Note (Signed)
NAMEMURL, MACCARONE                  ACCOUNT NO.:  [id number]   MEDICAL RECORD NO.:  [id number]          PATIENT TYPE:  AMB   LOCATION:  DAY                           FACILITY:  [facility]   PHYSICIAN:  Hildred Laser, M.D.    DATE OF BIRTH:  1923-12-03   DATE OF PROCEDURE:  09/21/2006  DATE OF DISCHARGE:                                 OPERATIVE REPORT   PROCEDURE:  Esophagogastroduodenoscopy with esophageal dilation followed by  colonoscopy.   INDICATION:  Leslie Rosario is an 77 year old Caucasian female with frequent  heartburn, dysphagia primarily to liquids who also complains of bloating and  abdominal pain.  She was recently treated for diverticulitis by Dr. [name].  She also has history of colonic polyps, and her last exam was in [place] Ros[place]  by Dr. Earlean Shawl of [place].   Procedures and risks were reviewed with the patient, and informed consent  was obtained.   FINDINGS:  Procedures were performed in endoscopy suite.  The patient's  vital signs and O2 saturation were monitored during the procedure and  remained stable.   PROCEDURE #1:  Esophagogastroduodenoscopy.  The patient was placed in the  left lateral position, and Olympus videoscope was passed via oropharynx  without any difficulty into esophagus.   Esophagus.  Mucosa of the esophagus was normal.  GE junction was at 40 cm.  Hernia was not apparent.  She has history of sliding hiatal hernia.  There  was erythema at GE junction with a soft stricture.  Pictures taken for the  record.   Stomach.  It was empty and distended very well with insufflation.  Folds of  the proximal stomach were normal.  Examination of the mucosa revealed antral  erythema, granularity and few petechiae.  No erosions or ulcers were noted.  Angularis, fundus and cardia were examined by retroflexing the scope and  were normal.   Duodenum.  Bulbar mucosa was normal.  Scope was passed into the second part  of the duodenum where mucosa and folds were  normal.  Endoscope was  withdrawn.   Esophagus was dilated by passing 54-French Maloney dilator to full  insertion.  As the dilator was withdrawn, endoscope was passed again.  There  was mucosal disruption at GE junction and also at cervical esophagus,  indicative of a web.  Endoscope was withdrawn, and the patient prepared for  procedure #2.   PROCEDURE #2:  Colonoscopy.  Rectal examination performed.  No abnormality  noted on external or digital exam.  The Olympus videoscope was placed in the  rectum and advanced under vision into sigmoid colon and beyond.  Preparation  was excellent.  Multiple small to medium size diverticula were noted at  sigmoid colon and few more at descending colon.  There was a 5-mm polyp at  distal descending colon which was ablated via cold biopsy.  The scope was  passed into cecum which was identified by appendiceal orifice and ileocecal  valve.  Pictures taken for the record.  Colonic mucosa was examined for the  second time on the way out, and there were no other  abnormalities.  Rectal  mucosa similarly was normal.  Scope was retroflexed to examine anorectal  junction, and small hemorrhoids were noted below the dentate line.  Endoscope was straightened and withdrawn.  The patient tolerated the  procedures well.   FINAL DIAGNOSIS:  1. 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  2. Nonerosive antral gastritis.  3. Left colonic diverticulosis.  Most of the diverticula are at sigmoid      colon.  4. A 5-mm polyp ablated via cold biopsy from descending colon.  5. Small external hemorrhoids.   RECOMMENDATIONS:  1. Anti-reflux measures.  2. Omeprazole 20 mg p.o. q.a.m., prescription given for 30 with 11      refills.  If it is cheaper by  OTC, she may do so.  3. High-fiber diet plus  fiber supplement 3-4 g daily.  4. H pylori serology will be checked today.  5. I will be contacting the patient with results of biopsy and blood test.      .      Hildred Laser, M.D.  Electronically Signed     NR/MEDQ  D:  09/21/2006  T:  09/22/2006  Job:  [id number]   cc:   Leonides Grills, M.D.  Fax: [phone]

## 2011-05-02 NOTE — Consult Note (Signed)
NAMEDORTHEY, [place]                  ACCOUNT NO.:  [id number]   MEDICAL RECORD NO.:  [id number]           PATIENT TYPE:  AMB   LOCATION:                                FACILITY:  [facility]   PHYSICIAN:  Hildred Laser, M.D.    DATE OF BIRTH:  January 23, 1923   DATE OF CONSULTATION:  DATE OF DISCHARGE:                                   CONSULTATION   REFERRING PHYSICIAN:  Dr. [name].   REASON FOR CONSULTATION:  Diverticular disease; history of colon polyps;  abdominal pain.   HISTORY OF PRESENT ILLNESS:  Leslie Rosario is an 77 year old Caucasian female  who tells me around August 18, 2006 she developed severe left-sided  abdominal pain.  She had the pain for about 2 days, was seen by her primary  care Phala Schraeder, Dr. [name], and a CT scan was ordered.  She was found to  have diverticulosis without evidence of diverticulitis.  At that point, she  had been on antibiotic, Cipro 500 mg b.i.d. for 2 days.  She described the  pain as constant and severe, 5/10 on the pain scale.  Once she started the  Cipro, the pain became more intermittent and has now subsided.  She denies  any fever or chills, nausea or vomiting.  She does have a history of chronic  constipation and she can go 3-4 days without a bowel movement.  She has been  using glycerin suppositories 2-3 times a month.  She has not tried fiber  supplements or any other laxatives.  She has noticed some dark stools,  denies any bright red bleeding.   She also tells me that she has a history of chronic GERD.  Her most common  symptom is heartburn.  She is taking Mylanta once or twice a week.  She has  a history of hiatal hernia.  She is complaining of a sensation of a air  bubble in her mid-esophagus.  She tells me when she drinks liquids, she  feels as though liquids get stuck halfway and she has to swallow multiple  times to have this air bubble pass.  She denies much problems with solid  foods.  She denies any regurgitation.   PAST MEDICAL  HISTORY:  1. Diverticulosis.  2. Hypercholesterolemia.  3. Anxiety.  4. Last colonoscopy was in 2001 by Dr. Earlean Shawl; she tells me she had a      history of polyps; she is unsure of pathology.  5. She has a history of GERD and hiatal hernia.  6. She is status post tonsillectomy.   CURRENT MEDICATIONS:  1. Xanax 0.5 mg q.6 h. p.r.n.  2. Just completed Cipro 500 mg b.i.d. for 10 days.  3. Zocor 10 mg daily.  4. Aspirin 81 mg daily.  5. Tylenol Extra Strength one-half daily p.r.n.  6. Fish oil 1 g daily.  7. Mineral oil one teaspoon daily p.r.n.   ALLERGIES:  SULFA and CODEINE.   FAMILY HISTORY:  There is no known family history of colorectal carcinoma or  other chronic GI problems.  Mother deceased at age 46  secondary to a  gynecologic cancer.  Father deceased at 48 secondary to renal failure.  There is 1 son with renal carcinoma, multiple siblings with COPD.   SOCIAL HISTORY:  Leslie Rosario is married, for 60 years.  She has 3 grown  healthy children.  She is retired from [employer].  She denies any  alcohol or drug use, has a remote history of tobacco use.   REVIEW OF SYSTEMS:  CONSTITUTIONAL:  Weight is slowly increasing.  She does  complain of fatigue.  CARDIOVASCULAR:  She has irregular heart rate.  She  has had an EKG, stress test and Holter monitor with benign findings.  PULMONARY:  Denies any shortness of breath, dyspnea, cough or hemoptysis.  GI:  See HPI.  GU:  Denies any dysuria, hematuria or increased urinary  frequency.  SKIN:  She has multiple nevi which have been recently treated by  freezing through Dr. Juel Burrow office, that are somewhat erythematous.   PHYSICAL EXAMINATION:  VITAL SIGNS:  Weight 145.15 pounds, height 64 inches.  Temperature 98.3, blood pressure 126/68 and pulse 60.  GENERAL:  Leslie Rosario is an 77 year old Caucasian female who is alert and  oriented, pleasant and cooperative, in no acute distress.  HEENT:  Sclerae are clear, anicteric.  Conjunctivae  pink.  Oropharynx pink  and moist without any lesions.  NECK:  Supple without mass or thyromegaly.  CHEST:  Regular rate and rhythm, normal S1 and S2, without murmurs, rubs,  thrills or gallops.  LUNGS:  Clear to auscultation bilaterally.  ABDOMEN:  Slightly distended.  Positive bowel sounds x4.  Protuberant with  no bruits auscultated.  Soft, nontender, without palpable mass or  hepatosplenomegaly.  No rebound tenderness or guarding.  EXTREMITIES:  Without clubbing or edema bilaterally.  SKIN:  Pink, warm and dry.  She does have 2 nevi to the right side of her  neck which are erythematous and recently treated through the dermatologist's  office.  Otherwise, no rash or jaundice.   IMPRESSION:  Leslie Rosario is an 77 year old Caucasian female with a history of  colon polyps and diverticulosis, who developed left-sided abdominal pain  approximately 2 weeks ago; she was treated with a 10-day course of Cipro.  CT scan showed diverticulosis without evidence of diverticulitis.  She also  has a history of chronic constipation which could be contributing to her  symptoms; however, given the fact that her colonoscopy was almost 8 years  ago, she is going to need further evaluation with colonoscopy at this time.  She has also noted dark stools.   She has a history of heartburn 1-2 days a week.  She carries a history of  hiatal hernia.  She recently has been having problems with dysphagia with  liquids and feeling like things get stuck in mid-esophagus.  She is going to  have an esophagogastroduodenoscopy at the same time with Dr. Laural Golden to  further evaluate her symptoms.   PLAN:  1. EGD and colonoscopy with Dr. Laural Golden in the near future.  I discussed      both procedures including risks and benefits which include, but not      limited to bleeding, infection, perforation and drug reaction.  She      agrees with the plan and consent will be obtained. 2. Diverticula literature given for her  review.  3. We have given her samples of Benefiber, Fibersure and Metamucil to      determine which fiber choice she likes  best and recommended daily fiber supplementation.  4. Further recommendations pending procedures.   COMMENT:  I would like to thank Dr. [name] for allowing [name] to participate  in the care of Leslie Rosario.      Les Pou, N.P.      Hildred Laser, M.D.  Electronically Signed    KC/MEDQ  D:  09/01/2006  T:  09/02/2006  Job:  [id number]   cc:   Leonides Grills, M.D.  Fax: [phone]

## 2011-05-09 ENCOUNTER — Other Ambulatory Visit (HOSPITAL_COMMUNITY): Payer: Self-pay | Admitting: Internal Medicine

## 2011-05-09 DIAGNOSIS — Z139 Encounter for screening, unspecified: Secondary | ICD-10-CM

## 2011-05-14 ENCOUNTER — Other Ambulatory Visit (HOSPITAL_COMMUNITY): Payer: Medicare Other

## 2011-05-21 ENCOUNTER — Ambulatory Visit (HOSPITAL_COMMUNITY)
Admission: RE | Admit: 2011-05-21 | Discharge: 2011-05-21 | Disposition: A | Discharge status: Home / Self Care | Payer: Medicare Other | Source: Ambulatory Visit | Attending: Internal Medicine | Admitting: Internal Medicine

## 2011-05-21 DIAGNOSIS — M899 Disorder of bone, unspecified: Secondary | ICD-10-CM | POA: Insufficient documentation

## 2011-05-21 DIAGNOSIS — Z139 Encounter for screening, unspecified: Secondary | ICD-10-CM

## 2011-05-21 DIAGNOSIS — Z780 Asymptomatic menopausal state: Secondary | ICD-10-CM | POA: Insufficient documentation

## 2011-05-21 DIAGNOSIS — M949 Disorder of cartilage, unspecified: Secondary | ICD-10-CM | POA: Insufficient documentation

## 2011-09-26 LAB — DIFFERENTIAL
Basophils Absolute: 0
Basophils Relative: 0
Eosinophils Absolute: 0.6
Eosinophils Relative: 4
Lymphocytes Relative: 6 — ABNORMAL LOW
Lymphs Abs: 0.8
Monocytes Absolute: 0.9 — ABNORMAL HIGH
Monocytes Relative: 6
Neutro Abs: 12.1 — ABNORMAL HIGH
Neutrophils Relative %: 84 — ABNORMAL HIGH

## 2011-09-26 LAB — URINALYSIS, ROUTINE W REFLEX MICROSCOPIC
Bilirubin Urine: NEGATIVE
Glucose, UA: NEGATIVE
Hgb urine dipstick: NEGATIVE
Ketones, ur: NEGATIVE
Nitrite: NEGATIVE
Protein, ur: NEGATIVE
Specific Gravity, Urine: 1.015
Urobilinogen, UA: 0.2
pH: 8

## 2011-09-26 LAB — CULTURE, BLOOD (ROUTINE X 2)
Culture: NO GROWTH
Report Status: 9122008

## 2011-09-26 LAB — CBC
HCT: 36.9
Hemoglobin: 12.6
MCHC: 34.2
MCV: 93.6
Platelets: 262
RBC: 3.94
RDW: 12.7
WBC: 14.5 — ABNORMAL HIGH

## 2011-12-29 ENCOUNTER — Emergency Department (HOSPITAL_COMMUNITY): Payer: Medicare Other

## 2011-12-29 ENCOUNTER — Encounter (HOSPITAL_COMMUNITY): Payer: Self-pay | Admitting: *Deleted

## 2011-12-29 ENCOUNTER — Observation Stay (HOSPITAL_COMMUNITY)
Admission: EM | Admit: 2011-12-29 | Discharge: 2011-12-30 | Disposition: A | Discharge status: Home / Self Care | Payer: Medicare Other | Attending: Internal Medicine | Admitting: Internal Medicine

## 2011-12-29 DIAGNOSIS — E860 Dehydration: Secondary | ICD-10-CM | POA: Insufficient documentation

## 2011-12-29 DIAGNOSIS — Z79899 Other long term (current) drug therapy: Secondary | ICD-10-CM | POA: Insufficient documentation

## 2011-12-29 DIAGNOSIS — R112 Nausea with vomiting, unspecified: Secondary | ICD-10-CM | POA: Insufficient documentation

## 2011-12-29 DIAGNOSIS — E7800 Pure hypercholesterolemia, unspecified: Secondary | ICD-10-CM | POA: Insufficient documentation

## 2011-12-29 DIAGNOSIS — E039 Hypothyroidism, unspecified: Secondary | ICD-10-CM | POA: Insufficient documentation

## 2011-12-29 DIAGNOSIS — K2900 Acute gastritis without bleeding: Principal | ICD-10-CM | POA: Insufficient documentation

## 2011-12-29 DIAGNOSIS — I4891 Unspecified atrial fibrillation: Secondary | ICD-10-CM | POA: Diagnosis present

## 2011-12-29 DIAGNOSIS — Z8719 Personal history of other diseases of the digestive system: Secondary | ICD-10-CM

## 2011-12-29 DIAGNOSIS — E785 Hyperlipidemia, unspecified: Secondary | ICD-10-CM | POA: Diagnosis present

## 2011-12-29 DIAGNOSIS — K529 Noninfective gastroenteritis and colitis, unspecified: Secondary | ICD-10-CM

## 2011-12-29 DIAGNOSIS — K219 Gastro-esophageal reflux disease without esophagitis: Secondary | ICD-10-CM | POA: Diagnosis present

## 2011-12-29 DIAGNOSIS — F411 Generalized anxiety disorder: Secondary | ICD-10-CM | POA: Diagnosis present

## 2011-12-29 HISTORY — DX: Unspecified atrial fibrillation: I48.91

## 2011-12-29 HISTORY — DX: Gastro-esophageal reflux disease without esophagitis: K21.9

## 2011-12-29 LAB — URINALYSIS, ROUTINE W REFLEX MICROSCOPIC
Bilirubin Urine: NEGATIVE
Glucose, UA: NEGATIVE mg/dL
Ketones, ur: NEGATIVE mg/dL
Leukocytes, UA: NEGATIVE
Nitrite: NEGATIVE
Protein, ur: NEGATIVE mg/dL
Specific Gravity, Urine: 1.025 (ref 1.005–1.030)
Urobilinogen, UA: 0.2 mg/dL (ref 0.0–1.0)
pH: 5.5 (ref 5.0–8.0)

## 2011-12-29 LAB — HEPATIC FUNCTION PANEL
ALT: 18 U/L (ref 0–35)
AST: 24 U/L (ref 0–37)
Albumin: 4 g/dL (ref 3.5–5.2)
Alkaline Phosphatase: 89 U/L (ref 39–117)
Bilirubin, Direct: <0.1 mg/dL (ref 0.0–0.3)
Total Bilirubin: 0.4 mg/dL (ref 0.3–1.2)
Total Protein: 7.6 g/dL (ref 6.0–8.3)

## 2011-12-29 LAB — URINE MICROSCOPIC-ADD ON

## 2011-12-29 LAB — CBC
HCT: 42.7 % (ref 36.0–46.0)
Hemoglobin: 14.2 g/dL (ref 12.0–15.0)
MCH: 31.6 pg (ref 26.0–34.0)
MCHC: 33.3 g/dL (ref 30.0–36.0)
MCV: 94.9 fL (ref 78.0–100.0)
Platelets: 243 10*3/uL (ref 150–400)
RBC: 4.5 MIL/uL (ref 3.87–5.11)
RDW: 12.3 % (ref 11.5–15.5)
WBC: 16.3 10*3/uL — ABNORMAL HIGH (ref 4.0–10.5)

## 2011-12-29 LAB — DIFFERENTIAL
Basophils Absolute: 0 10*3/uL (ref 0.0–0.1)
Basophils Relative: 0 % (ref 0–1)
Eosinophils Absolute: 0.1 10*3/uL (ref 0.0–0.7)
Eosinophils Relative: 0 % (ref 0–5)
Lymphocytes Relative: 4 % — ABNORMAL LOW (ref 12–46)
Lymphs Abs: 0.6 10*3/uL — ABNORMAL LOW (ref 0.7–4.0)
Monocytes Absolute: 1.8 10*3/uL — ABNORMAL HIGH (ref 0.1–1.0)
Monocytes Relative: 11 % (ref 3–12)
Neutro Abs: 13.9 10*3/uL — ABNORMAL HIGH (ref 1.7–7.7)
Neutrophils Relative %: 85 % — ABNORMAL HIGH (ref 43–77)

## 2011-12-29 LAB — COMPREHENSIVE METABOLIC PANEL
ALT: 18 U/L (ref 0–35)
AST: 23 U/L (ref 0–37)
Albumin: 4 g/dL (ref 3.5–5.2)
Alkaline Phosphatase: 92 U/L (ref 39–117)
BUN: 15 mg/dL (ref 6–23)
CO2: 29 mEq/L (ref 19–32)
Calcium: 9.7 mg/dL (ref 8.4–10.5)
Chloride: 102 mEq/L (ref 96–112)
Creatinine, Ser: 0.81 mg/dL (ref 0.50–1.10)
GFR calc Af Amer: 74 mL/min — ABNORMAL LOW (ref >90)
GFR calc non Af Amer: 64 mL/min — ABNORMAL LOW (ref >90)
Glucose, Bld: 149 mg/dL — ABNORMAL HIGH (ref 70–99)
Potassium: 4.3 mEq/L (ref 3.5–5.1)
Sodium: 139 mEq/L (ref 135–145)
Total Bilirubin: 0.5 mg/dL (ref 0.3–1.2)
Total Protein: 7.8 g/dL (ref 6.0–8.3)

## 2011-12-29 LAB — PROTIME-INR
INR: 2.29 — ABNORMAL HIGH (ref 0.00–1.49)
Prothrombin Time: 25.6 seconds — ABNORMAL HIGH (ref 11.6–15.2)

## 2011-12-29 MED ORDER — SODIUM CHLORIDE 0.9 % IV BOLUS (SEPSIS)
1000.0000 mL | Freq: Once | INTRAVENOUS | Status: AC
  Administered 2011-12-29: 1000 mL via INTRAVENOUS

## 2011-12-29 MED ORDER — METOPROLOL SUCCINATE ER 25 MG PO TB24
12.5000 mg | ORAL_TABLET | Freq: Every day | ORAL | Status: DC
  Filled 2011-12-29 (×2): qty 1

## 2011-12-29 MED ORDER — ONDANSETRON HCL 4 MG/2ML IJ SOLN: 4.0000 mg | Freq: Three times a day (TID) | INTRAMUSCULAR | Status: DC | PRN

## 2011-12-29 MED ORDER — SIMVASTATIN 20 MG PO TABS
20.0000 mg | ORAL_TABLET | Freq: Every day | ORAL | Status: DC
  Administered 2011-12-29: 20 mg via ORAL
  Filled 2011-12-29: qty 1

## 2011-12-29 MED ORDER — ACETAMINOPHEN 325 MG PO TABS: 650.0000 mg | ORAL_TABLET | Freq: Four times a day (QID) | ORAL | Status: DC | PRN

## 2011-12-29 MED ORDER — ONDANSETRON HCL 4 MG/2ML IJ SOLN: 4.0000 mg | INTRAMUSCULAR | Status: DC | PRN

## 2011-12-29 MED ORDER — ACETAMINOPHEN 650 MG RE SUPP: 650.0000 mg | Freq: Four times a day (QID) | RECTAL | Status: DC | PRN

## 2011-12-29 MED ORDER — DONEPEZIL HCL 5 MG PO TABS
5.0000 mg | ORAL_TABLET | Freq: Every day | ORAL | Status: DC
  Administered 2011-12-29: 5 mg via ORAL
  Filled 2011-12-29: qty 1

## 2011-12-29 MED ORDER — POTASSIUM CHLORIDE IN NACL 20-0.9 MEQ/L-% IV SOLN
INTRAVENOUS | Status: DC
  Administered 2011-12-29: 22:00:00 via INTRAVENOUS

## 2011-12-29 MED ORDER — ENOXAPARIN SODIUM 40 MG/0.4ML ~~LOC~~ SOLN: 40.0000 mg | SUBCUTANEOUS | Status: DC

## 2011-12-29 MED ORDER — WARFARIN SODIUM 2.5 MG PO TABS
4.5000 mg | ORAL_TABLET | Freq: Once | ORAL | Status: AC
  Administered 2011-12-29: 4.5 mg via ORAL
  Filled 2011-12-29: qty 1

## 2011-12-29 MED ORDER — ONDANSETRON HCL 4 MG/2ML IJ SOLN
4.0000 mg | Freq: Once | INTRAMUSCULAR | Status: AC
  Administered 2011-12-29: 4 mg via INTRAVENOUS
  Filled 2011-12-29: qty 2

## 2011-12-29 MED ORDER — ASPIRIN EC 81 MG PO TBEC: 81.0000 mg | DELAYED_RELEASE_TABLET | Freq: Every day | ORAL | Status: DC

## 2011-12-29 MED ORDER — TRAZODONE HCL 50 MG PO TABS: 25.0000 mg | ORAL_TABLET | Freq: Every evening | ORAL | Status: DC | PRN

## 2011-12-29 MED ORDER — LORAZEPAM 0.5 MG PO TABS: 0.5000 mg | ORAL_TABLET | Freq: Every evening | ORAL | Status: DC | PRN

## 2011-12-29 MED ORDER — PANTOPRAZOLE SODIUM 40 MG IV SOLR
40.0000 mg | Freq: Once | INTRAVENOUS | Status: AC
  Administered 2011-12-29: 40 mg via INTRAVENOUS
  Filled 2011-12-29: qty 40

## 2011-12-29 MED ORDER — LEVOTHYROXINE SODIUM 50 MCG PO TABS
50.0000 ug | ORAL_TABLET | Freq: Every day | ORAL | Status: DC
  Filled 2011-12-29: qty 1

## 2011-12-29 MED ORDER — PANTOPRAZOLE SODIUM 40 MG PO TBEC
40.0000 mg | DELAYED_RELEASE_TABLET | Freq: Every day | ORAL | Status: DC
  Filled 2011-12-29: qty 1

## 2011-12-29 MED ORDER — SODIUM CHLORIDE 0.9 % IV SOLN: INTRAVENOUS | Status: DC

## 2011-12-29 MED ORDER — SODIUM CHLORIDE 0.9 % IV BOLUS (SEPSIS)
500.0000 mL | Freq: Once | INTRAVENOUS | Status: AC
  Administered 2011-12-29: 500 mL via INTRAVENOUS

## 2011-12-29 NOTE — H&P (Signed)
PCP:   Glo Herring., MD, MD   Chief Complaint:  Vomiting since this morning  HPI: Leslie Rosario is an 78 y.o. Caucasian female. Fairly high functioning lady although has some mild memory impairment; a son but he is out of all day at work; has been in fair health until vomiting start to do about it 10 AM this morning. She has had no fever cough or abdominal pain and no diarrhea. Emesis contained no blood and she is unclear about the exact content.  In the emergency room patient has had antiemetics, IV fluids and is feeling much better; she drank a small can of Coke without difficulty, but doesn't feel well enough to return home.  She used to take Aricept for a short time but discontinued. Ambulates with the assistance of a cane or without assistance  Rewiew of Systems:  The patient denies  fever, weight loss,, vision loss, decreased hearing, hoarseness, chest pain, syncope, dyspnea on exertion, peripheral edema, balance deficits, hemoptysis, abdominal pain, melena, hematochezia, severe indigestion/heartburn, hematuria, incontinence, genital sores, muscle weakness, suspicious skin lesions, transient blindness, difficulty walking, depression, unusual weight change, abnormal bleeding, enlarged lymph nodes, angioedema, and breast masses.    Past Medical History  Diagnosis Date  . GERD (gastroesophageal reflux disease)   . Thyroid disease     hypothyroidism  . Atrial fibrillation   . High cholesterol     Past Surgical History  Procedure Date  . Tonsillectomy     Medications:  HOME MEDS: Prior to Admission medications   Medication Sig Start Date End Date Taking? Authorizing Provider  aspirin EC 81 MG tablet Take 81 mg by mouth daily.   Yes Historical Provider, MD  levothyroxine (SYNTHROID, LEVOTHROID) 50 MCG tablet Take 50 mcg by mouth daily.     Yes Historical Provider, MD  lidocaine (LIDODERM) 5 % Place 1 patch onto the skin every 12 (twelve) hours. Remove & Discard patch within  12 hours or as directed by MD    Yes Historical Provider, MD  LORazepam (ATIVAN) 0.5 MG tablet Take 0.5 mg by mouth at bedtime as needed. For sleep   Yes Historical Provider, MD  metoprolol (TOPROL-XL) 25 MG 24 hr tablet Take 12.5 mg by mouth daily.    Yes Historical Provider, MD  omeprazole (PRILOSEC) 20 MG capsule Take 20 mg by mouth daily.     Yes Historical Provider, MD  simvastatin (ZOCOR) 20 MG tablet Take 20 mg by mouth daily.   Yes Historical Provider, MD  warfarin (COUMADIN) 3 MG tablet Take 3-4.5 mg by mouth as directed. 1.5 tab on Monday Wednesday 1 tab all other days   Yes Historical Provider, MD     Allergies:  Allergies  Allergen Reactions  . Codeine     REACTION: UNKNOWN REACTION  . Sulfonamide Derivatives     REACTION: UNKNOWN REACTION    Social History:   reports that she has never smoked. She does not have any smokeless tobacco history on file. She reports that she does not drink alcohol or use illicit drugs.  Family History: Family History  Problem Relation Age of Onset  . Cancer Mother   . Cancer Father   . Aortic aneurysm    . Cancer Brother      Physical Exam: Filed Vitals:   12/29/11 1415 12/29/11 1723 12/29/11 1933 12/29/11 2121  BP: 108/62 105/48 115/46 111/62  Pulse: 98 99  79  Temp: 98.3 F (36.8 C)   98.3 F (36.8 C)  TempSrc:  Oral  Resp:    20  Height:    5\' 4"  (1.626 m)  Weight:    68.1 kg (150 lb 2.1 oz)  SpO2: 97% 93% 96% 95%   Blood pressure 111/62, pulse 79, temperature 98.3 F (36.8 C), temperature source Oral, resp. rate 20, height 5\' 4"  (1.626 m), weight 68.1 kg (150 lb 2.1 oz), SpO2 95.00%.  GEN:  Pleasant elderly Caucasian lady reclining in the stretcher  no acute distress; cooperative with exam; tends to repeat herself quite often. Seems to have short-term memory impairment PSYCH:  alert and oriented x4; does not appear anxious does not appear depressed; affect is normal HEENT: Mucous membranes pink and dry and anicteric;  PERRLA; EOM intact; no cervical lymphadenopathy nor thyromegaly or carotid bruit; no JVD; Breasts:: Not examined CHEST WALL: No tenderness CHEST: Normal respiration, clear to auscultation bilaterally HEART: Regular rate and rhythm; no murmurs rubs or gallops BACK: Moderate kyphosis no scoliosis; no CVA tenderness ABDOMEN: Obese, soft non-tender; no masses, no organomegaly, normal abdominal bowel sounds; moderate pannus; no intertriginous candida. Rectal Exam: Not done EXTREMITIES:  age-appropriate arthropathy of the hands and knees; no edema; no ulcerations. Genitalia: not examined PULSES: 2+ and symmetric SKIN: Normal hydration no rash or ulceration CNS: Cranial nerves 2-12 grossly intact no focal neurologic deficit   Labs & Imaging Results for orders placed during the hospital encounter of 12/29/11 (from the past 48 hour(s))  CBC     Status: Abnormal   Collection Time   12/29/11  2:36 PM      Component Value Range Comment   WBC 16.3 (*) 4.0 - 10.5 (K/uL)    RBC 4.50  3.87 - 5.11 (MIL/uL)    Hemoglobin 14.2  12.0 - 15.0 (g/dL)    HCT 42.7  36.0 - 46.0 (%)    MCV 94.9  78.0 - 100.0 (fL)    MCH 31.6  26.0 - 34.0 (pg)    MCHC 33.3  30.0 - 36.0 (g/dL)    RDW 12.3  11.5 - 15.5 (%)    Platelets 243  150 - 400 (K/uL)   DIFFERENTIAL     Status: Abnormal   Collection Time   12/29/11  2:36 PM      Component Value Range Comment   Neutrophils Relative 85 (*) 43 - 77 (%)    Neutro Abs 13.9 (*) 1.7 - 7.7 (K/uL)    Lymphocytes Relative 4 (*) 12 - 46 (%)    Lymphs Abs 0.6 (*) 0.7 - 4.0 (K/uL)    Monocytes Relative 11  3 - 12 (%)    Monocytes Absolute 1.8 (*) 0.1 - 1.0 (K/uL)    Eosinophils Relative 0  0 - 5 (%)    Eosinophils Absolute 0.1  0.0 - 0.7 (K/uL)    Basophils Relative 0  0 - 1 (%)    Basophils Absolute 0.0  0.0 - 0.1 (K/uL)   COMPREHENSIVE METABOLIC PANEL     Status: Abnormal   Collection Time   12/29/11  2:36 PM      Component Value Range Comment   Sodium 139  135 - 145  (mEq/L)    Potassium 4.3  3.5 - 5.1 (mEq/L)    Chloride 102  96 - 112 (mEq/L)    CO2 29  19 - 32 (mEq/L)    Glucose, Bld 149 (*) 70 - 99 (mg/dL)    BUN 15  6 - 23 (mg/dL)    Creatinine, Ser 0.81  0.50 - 1.10 (mg/dL)  Calcium 9.7  8.4 - 10.5 (mg/dL)    Total Protein 7.8  6.0 - 8.3 (g/dL)    Albumin 4.0  3.5 - 5.2 (g/dL)    AST 23  0 - 37 (U/L)    ALT 18  0 - 35 (U/L)    Alkaline Phosphatase 92  39 - 117 (U/L)    Total Bilirubin 0.5  0.3 - 1.2 (mg/dL)    GFR calc non Af Amer 64 (*) >90 (mL/min)    GFR calc Af Amer 74 (*) >90 (mL/min)   PROTIME-INR     Status: Abnormal   Collection Time   12/29/11  2:36 PM      Component Value Range Comment   Prothrombin Time 25.6 (*) 11.6 - 15.2 (seconds)    INR 2.29 (*) 0.00 - 1.49    URINALYSIS, ROUTINE W REFLEX MICROSCOPIC     Status: Abnormal   Collection Time   12/29/11  5:14 PM      Component Value Range Comment   Color, Urine YELLOW  YELLOW     APPearance CLEAR  CLEAR     Specific Gravity, Urine 1.025  1.005 - 1.030     pH 5.5  5.0 - 8.0     Glucose, UA NEGATIVE  NEGATIVE (mg/dL)    Hgb urine dipstick TRACE (*) NEGATIVE     Bilirubin Urine NEGATIVE  NEGATIVE     Ketones, ur NEGATIVE  NEGATIVE (mg/dL)    Protein, ur NEGATIVE  NEGATIVE (mg/dL)    Urobilinogen, UA 0.2  0.0 - 1.0 (mg/dL)    Nitrite NEGATIVE  NEGATIVE     Leukocytes, UA NEGATIVE  NEGATIVE    URINE MICROSCOPIC-ADD ON     Status: Abnormal   Collection Time   12/29/11  5:14 PM      Component Value Range Comment   Squamous Epithelial / LPF FEW (*) RARE     WBC, UA 3-6  <3 (WBC/hpf)    RBC / HPF 0-2  <3 (RBC/hpf)    Bacteria, UA RARE  RARE     Urine-Other MUCOUS PRESENT      Dg Abd Acute W/chest  12/29/2011  *RADIOLOGY REPORT*  Clinical Data: Hemoptysis, vomiting  ACUTE ABDOMEN SERIES (ABDOMEN 2 VIEW & CHEST 1 VIEW)  Comparison: Chest radiograph 12/24/2010  Findings: Borderline enlargement of cardiac silhouette. Calcified tortuous aorta. Pulmonary vascularity normal.  Emphysematous and bronchitic changes. Minimal right basilar atelectasis. Remaining lungs clear. Bones diffusely demineralized. Nonobstructive bowel gas pattern. No bowel dilatation, bowel wall thickening or free intraperitoneal air. No urinary tract calcification.  IMPRESSION: Emphysematous and bronchitic changes. Minimal right basilar atelectasis. No acute abdominal findings.  Original Report Authenticated By: Burnetta Sabin, M.D.      Assessment Present on Admission:  .ANXIETY .Atrial fibrillation .GERD (gastroesophageal reflux disease) .HYPERLIPIDEMIA .Acute gastritis  PLAN: We'll bring this lady on observation for hydration, since her leukocytosis seems to indicate dehydration and is compatible with her clinical appearance..  Will place her on a clear liquid diet, give anti-emetic, and anti-GERD medication, and reevaluate in the morning.  Other plans as per orders.    Leslie Rosario 12/29/2011, 9:29 PM

## 2011-12-29 NOTE — ED Provider Notes (Cosign Needed)
History     CSN: [id number]  Arrival date & time 12/29/11  1209   First MD Initiated Contact with Patient 12/29/11 1343      Chief Complaint  Patient presents with  . Emesis    (Consider location/radiation/quality/duration/timing/severity/associated sxs/prior treatment) Patient is a 78 y.o. female presenting with vomiting. The history is provided by the patient (patient states that she's been vomiting all day. She states she is not vomiting blood she has no pain no fever.). No language interpreter was used.  Emesis  This is a new problem. The current episode started 12 to 24 hours ago. The problem occurs continuously. The problem has not changed since onset.The emesis has an appearance of stomach contents. There has been no fever. Pertinent negatives include no abdominal pain, no chills, no cough, no diarrhea, no fever and no headaches. Risk factors include ill contacts.    Past Medical History  Diagnosis Date  . GERD (gastroesophageal reflux disease)   . Thyroid disease     hypothyroidism  . Atrial fibrillation   . High cholesterol     Past Surgical History  Procedure Date  . Tonsillectomy     No family history on file.  History  Substance Use Topics  . Smoking status: Never Smoker   . Smokeless tobacco: Not on file  . Alcohol Use: No    OB History    Grav Para Term Preterm Abortions TAB SAB Ect Mult Living                  Review of Systems  Constitutional: Negative for fever, chills and fatigue.  HENT: Negative for congestion, sinus pressure and ear discharge.   Eyes: Negative for discharge.  Respiratory: Negative for cough.   Cardiovascular: Negative for chest pain.  Gastrointestinal: Positive for nausea and vomiting. Negative for abdominal pain and diarrhea.  Genitourinary: Negative for frequency and hematuria.  Musculoskeletal: Negative for back pain.  Skin: Negative for rash.  Neurological: Negative for seizures and headaches.  Hematological:  Negative.   Psychiatric/Behavioral: Negative for hallucinations.    Allergies  Codeine and Sulfonamide derivatives  Home Medications   Current Outpatient Rx  Name Route Sig Dispense Refill  . ASPIRIN EC 81 MG PO TBEC Oral Take 81 mg by mouth daily.    Marland Kitchen LEVOTHYROXINE SODIUM 50 MCG PO TABS Oral Take 50 mcg by mouth daily.      Marland Kitchen LIDOCAINE 5 % EX PTCH Transdermal Place 1 patch onto the skin every 12 (twelve) hours. Remove & Discard patch within 12 hours or as directed by MD     . LORAZEPAM 0.5 MG PO TABS Oral Take 0.5 mg by mouth at bedtime as needed. For sleep    . METOPROLOL SUCCINATE ER 25 MG PO TB24 Oral Take 12.5 mg by mouth daily.     Marland Kitchen OMEPRAZOLE 20 MG PO CPDR Oral Take 20 mg by mouth daily.      Marland Kitchen SIMVASTATIN 20 MG PO TABS Oral Take 20 mg by mouth daily.    . WARFARIN SODIUM 3 MG PO TABS Oral Take 3-4.5 mg by mouth as directed. 1.5 tab on Monday Wednesday 1 tab all other days      BP 105/48  Pulse 99  Temp(Src) 98.3 F (36.8 C) (Oral)  Resp 20  Ht 5\' 4"  (1.626 m)  Wt 145 lb (65.772 kg)  BMI 24.89 kg/m2  SpO2 93%  Physical Exam  Constitutional: She is oriented to person, place, and time. She appears well-developed.  HENT:  Head: Normocephalic and atraumatic.  Eyes: Conjunctivae and EOM are normal. No scleral icterus.  Neck: Neck supple. No thyromegaly present.  Cardiovascular: Normal rate and regular rhythm.  Exam reveals no gallop and no friction rub.   No murmur heard. Pulmonary/Chest: No stridor. She has no wheezes. She has no rales. She exhibits no tenderness.  Abdominal: She exhibits no distension. There is no tenderness. There is no rebound.  Musculoskeletal: Normal range of motion. She exhibits no edema.  Lymphadenopathy:    She has no cervical adenopathy.  Neurological: She is oriented to person, place, and time. Coordination normal.  Skin: No rash noted. No erythema.  Psychiatric: She has a normal mood and affect. Her behavior is normal.    ED Course    Procedures (including critical care time)  Labs Reviewed  CBC - Abnormal; Notable for the following:    WBC 16.3 (*)    All other components within normal limits  DIFFERENTIAL - Abnormal; Notable for the following:    Neutrophils Relative 85 (*)    Neutro Abs 13.9 (*)    Lymphocytes Relative 4 (*)    Lymphs Abs 0.6 (*)    Monocytes Absolute 1.8 (*)    All other components within normal limits  COMPREHENSIVE METABOLIC PANEL - Abnormal; Notable for the following:    Glucose, Bld 149 (*)    GFR calc non Af Amer 64 (*)    GFR calc Af Amer 74 (*)    All other components within normal limits  URINALYSIS, ROUTINE W REFLEX MICROSCOPIC - Abnormal; Notable for the following:    Hgb urine dipstick TRACE (*)    All other components within normal limits  URINE MICROSCOPIC-ADD ON - Abnormal; Notable for the following:    Squamous Epithelial / LPF FEW (*)    All other components within normal limits  PROTIME-INR   Dg Abd Acute W/chest  12/29/2011  *RADIOLOGY REPORT*  Clinical Data: Hemoptysis, vomiting  ACUTE ABDOMEN SERIES (ABDOMEN 2 VIEW & CHEST 1 VIEW)  Comparison: Chest radiograph 12/24/2010  Findings: Borderline enlargement of cardiac silhouette. Calcified tortuous aorta. Pulmonary vascularity normal. Emphysematous and bronchitic changes. Minimal right basilar atelectasis. Remaining lungs clear. Bones diffusely demineralized. Nonobstructive bowel gas pattern. No bowel dilatation, bowel wall thickening or free intraperitoneal air. No urinary tract calcification.  IMPRESSION: Emphysematous and bronchitic changes. Minimal right basilar atelectasis. No acute abdominal findings.  Original Report Authenticated By: Burnetta Sabin, M.D.     1. Gastroenteritis       MDM  Vomiting for viral infection        Maudry Diego, MD 12/29/11 1905

## 2011-12-29 NOTE — ED Notes (Signed)
C/o n/v onset 0400 today; denies pain.

## 2011-12-29 NOTE — Progress Notes (Addendum)
ANTICOAGULATION CONSULT NOTE - Initial Consult  Pharmacy Consult for  Coumadin Indication: atrial fibrillation  Allergies  Allergen Reactions  . Codeine     REACTION: UNKNOWN REACTION  . Sulfonamide Derivatives     REACTION: UNKNOWN REACTION    Patient Measurements: Height: 5\' 4"  (162.6 cm) Weight: 150 lb 2.1 oz (68.1 kg) IBW/kg (Calculated) : 54.7    Vital Signs: Temp: 98.3 F (36.8 C) (01/14 2121) Temp src: Oral (01/14 2121) BP: 111/62 mmHg (01/14 2121) Pulse Rate: 79  (01/14 2121)  Labs:  Basename 12/29/11 1436  HGB 14.2  HCT 42.7  PLT 243  APTT --  LABPROT 25.6*  INR 2.29*  HEPARINUNFRC --  CREATININE 0.81  CKTOTAL --  CKMB --  TROPONINI --   Estimated Creatinine Clearance: 47.3 ml/min (by C-G formula based on Cr of 0.81).  Medical History: Past Medical History  Diagnosis Date  . GERD (gastroesophageal reflux disease)   . Thyroid disease     hypothyroidism  . Atrial fibrillation   . High cholesterol     Medications:  Scheduled:    . aspirin EC  81 mg Oral Daily  . donepezil  5 mg Oral QHS  . enoxaparin  40 mg Subcutaneous Q24H  . levothyroxine  50 mcg Oral QAC breakfast  . metoprolol succinate  12.5 mg Oral Daily  . ondansetron  4 mg Intravenous Once  . pantoprazole (PROTONIX) IV  40 mg Intravenous Once   Followed by  . pantoprazole  40 mg Oral Q1200  . simvastatin  20 mg Oral q1800  . sodium chloride  1,000 mL Intravenous Once  . sodium chloride  500 mL Intravenous Once  . warfarin  4.5 mg Oral Once  . DISCONTD: sodium chloride   Intravenous STAT    Assessment: Ok for protocol INR 2.29  Goal of Therapy:  INR 2-3   Plan:  Coumadin 4.5 mg tonight (home dose) INR daily Monitor CBC Discontinue Lovenox 40 mg, patient on Coumadin, INR therapeutic   Leslie Rosario, Leslie Rosario 12/29/2011,9:38 PM

## 2011-12-30 LAB — CBC
HCT: 32.4 % — ABNORMAL LOW (ref 36.0–46.0)
Hemoglobin: 10.7 g/dL — ABNORMAL LOW (ref 12.0–15.0)
MCH: 31.5 pg (ref 26.0–34.0)
MCHC: 33 g/dL (ref 30.0–36.0)
MCV: 95.3 fL (ref 78.0–100.0)
Platelets: 210 10*3/uL (ref 150–400)
RBC: 3.4 MIL/uL — ABNORMAL LOW (ref 3.87–5.11)
RDW: 12.5 % (ref 11.5–15.5)
WBC: 11.5 10*3/uL — ABNORMAL HIGH (ref 4.0–10.5)

## 2011-12-30 LAB — HEMOGLOBIN A1C
Hgb A1c MFr Bld: 5.7 % — ABNORMAL HIGH (ref <5.7)
Mean Plasma Glucose: 117 mg/dL — ABNORMAL HIGH (ref <117)

## 2011-12-30 LAB — CARDIAC PANEL(CRET KIN+CKTOT+MB+TROPI)
CK, MB: 2 ng/mL (ref 0.3–4.0)
CK, MB: 2.2 ng/mL (ref 0.3–4.0)
Relative Index: INVALID (ref 0.0–2.5)
Relative Index: INVALID (ref 0.0–2.5)
Total CK: 52 U/L (ref 7–177)
Total CK: 58 U/L (ref 7–177)
Troponin I: <0.3 ng/mL (ref <0.30)
Troponin I: <0.3 ng/mL (ref <0.30)

## 2011-12-30 LAB — BASIC METABOLIC PANEL
BUN: 16 mg/dL (ref 6–23)
CO2: 27 mEq/L (ref 19–32)
Calcium: 7.8 mg/dL — ABNORMAL LOW (ref 8.4–10.5)
Chloride: 109 mEq/L (ref 96–112)
Creatinine, Ser: 0.91 mg/dL (ref 0.50–1.10)
GFR calc Af Amer: 64 mL/min — ABNORMAL LOW (ref >90)
GFR calc non Af Amer: 56 mL/min — ABNORMAL LOW (ref >90)
Glucose, Bld: 104 mg/dL — ABNORMAL HIGH (ref 70–99)
Potassium: 4 mEq/L (ref 3.5–5.1)
Sodium: 140 mEq/L (ref 135–145)

## 2011-12-30 LAB — PROTIME-INR
INR: 2.56 — ABNORMAL HIGH (ref 0.00–1.49)
Prothrombin Time: 27.9 seconds — ABNORMAL HIGH (ref 11.6–15.2)

## 2011-12-30 MED ORDER — ONDANSETRON HCL 4 MG PO TABS: 4.0000 mg | ORAL_TABLET | Freq: Three times a day (TID) | ORAL | Status: AC | PRN

## 2011-12-30 MED ORDER — WARFARIN SODIUM 1 MG PO TABS: 3.0000 mg | ORAL_TABLET | Freq: Once | ORAL | Status: DC

## 2011-12-30 NOTE — Progress Notes (Signed)
UR Chart Review Completed  

## 2011-12-30 NOTE — Progress Notes (Addendum)
ANTICOAGULATION CONSULT NOTE -   Pharmacy Consult for  Coumadin Indication: atrial fibrillation  Allergies  Allergen Reactions  . Codeine     REACTION: UNKNOWN REACTION  . Sulfonamide Derivatives     REACTION: UNKNOWN REACTION    Patient Measurements: Height: 5\' 4"  (162.6 cm) Weight: 150 lb 5.7 oz (68.2 kg) IBW/kg (Calculated) : 54.7    Vital Signs: Temp: 98.1 F (36.7 C) (01/15 0613) Temp src: Oral (01/15 0613) BP: 112/52 mmHg (01/15 0613) Pulse Rate: 67  (01/15 0613)  Labs:  Basename 12/30/11 0521 12/30/11 0516 12/30/11 0002 12/29/11 1436  HGB 10.7* -- -- 14.2  HCT 32.4* -- -- 42.7  PLT 210 -- -- 243  APTT -- -- -- --  LABPROT -- 27.9* -- 25.6*  INR -- 2.56* -- 2.29*  HEPARINUNFRC -- -- -- --  CREATININE -- 0.91 -- 0.81  CKTOTAL -- 58 52 --  CKMB -- 2.2 2.0 --  TROPONINI -- <0.30 <0.30 --   Estimated Creatinine Clearance: 42.1 ml/min (by C-G formula based on Cr of 0.91).  Medical History: Past Medical History  Diagnosis Date  . GERD (gastroesophageal reflux disease)   . Thyroid disease     hypothyroidism  . Atrial fibrillation   . High cholesterol     Medications:  Scheduled:     . aspirin EC  81 mg Oral Daily  . donepezil  5 mg Oral QHS  . levothyroxine  50 mcg Oral QAC breakfast  . metoprolol succinate  12.5 mg Oral Daily  . ondansetron  4 mg Intravenous Once  . pantoprazole (PROTONIX) IV  40 mg Intravenous Once   Followed by  . pantoprazole  40 mg Oral Q1200  . simvastatin  20 mg Oral q1800  . sodium chloride  1,000 mL Intravenous Once  . sodium chloride  500 mL Intravenous Once  . warfarin  3 mg Oral ONCE-1800  . warfarin  4.5 mg Oral Once  . DISCONTD: sodium chloride   Intravenous STAT  . DISCONTD: enoxaparin  40 mg Subcutaneous Q24H    Assessment: Continuation of home therapy for Afib. Home dose 4.5 mg on Monday and Wednesday and 3 mg on all other days. INR therapeutic today.  Goal of Therapy:  INR 2-3   Plan:  Coumadin 3 mg  tonight (home dose) INR daily Monitor CBC    Jetta Lout J 12/30/2011,8:42 AM

## 2011-12-30 NOTE — Discharge Summary (Signed)
Physician Discharge Summary  Patient ID: Leslie Rosario MRN: [id number] DOB/AGE: [birth year] 76 y.o.  Admit date: 12/29/2011 Discharge date: 12/30/2011  Primary Care Physician:  Glo Herring., MD, MD   Discharge Diagnoses:    Active Problems:  HYPERCHOLESTEROLEMIA  HYPERLIPIDEMIA  ANXIETY  Atrial fibrillation  HIATAL HERNIA, HX OF  GERD (gastroesophageal reflux disease)  Acute gastritis Dehydration Hypthyroidism   Current Discharge Medication List    CONTINUE these medications which have NOT CHANGED   Details  aspirin EC 81 MG tablet Take 81 mg by mouth daily.    levothyroxine (SYNTHROID, LEVOTHROID) 50 MCG tablet Take 50 mcg by mouth daily.      lidocaine (LIDODERM) 5 % Place 1 patch onto the skin every 12 (twelve) hours. Remove & Discard patch within 12 hours or as directed by MD     LORazepam (ATIVAN) 0.5 MG tablet Take 0.5 mg by mouth at bedtime as needed. For sleep    metoprolol (TOPROL-XL) 25 MG 24 hr tablet Take 12.5 mg by mouth daily.     omeprazole (PRILOSEC) 20 MG capsule Take 20 mg by mouth daily.      simvastatin (ZOCOR) 20 MG tablet Take 20 mg by mouth daily.    warfarin (COUMADIN) 3 MG tablet Take 3-4.5 mg by mouth as directed. 1.5 tab on Monday Wednesday 1 tab all other days      STOP taking these medications     aspirin 81 MG tablet      cephALEXin (KEFLEX) 500 MG capsule         Discharge Exam: Blood pressure 96/58, pulse 65, temperature 98.1 F (36.7 C), temperature source Oral, resp. rate 18, height 5\' 4"  (1.626 m), weight 68.2 kg (150 lb 5.7 oz), SpO2 95.00%. NAD CTA B S1, S2, irregular Soft, NT, ND, BS+ No edema b/l  Disposition and Follow-up:  Follow up with primary doctor in 1-2 weeks  Consults:  None.   Significant Diagnostic Studies:  No results found.  Brief H and P: For complete details please refer to admission H and P, but in brief Leslie Rosario is an 78 y.o. Caucasian female. Fairly high functioning lady although has  some mild memory impairment; a son but he is out of all day at work; has been in fair health until vomiting start to do about it 10 AM this morning. She has had no fever cough or abdominal pain and no diarrhea. Emesis contained no blood and she is unclear about the exact content.  In the emergency room patient has had antiemetics, IV fluids and is feeling much better; she drank a small can of Coke without difficulty, but doesn't feel well enough to return home.  She used to take Aricept for a short time but discontinued.  Ambulates with the assistance of a cane or without assistance     Hospital Course:  Patient was admitted to the hospital with persistent nausea and vomiting.  She reported the onset of her symptoms after she had eaten out at a restaurant. Abd xray was negative and LFTs were unremarkable.  She was noted to be significantly dehydrated and felt too weak to discharge home.  She was admitted under observation status and given supportive therapy with IV fluids and antiemetics.  The cause of her symptoms was likely a viral gastroenteritis. The following morning, she was having no further nausea or vomiting.  She tolerated her breakfast.  She feels significantly improved and wants to go home.  She is continued on coumadin  as well as the remainder of her chronic medications. She will be given a prescription for zofran. We will have her follow up with her primary doctor in the next 1-2 weeks.  Time spent on Discharge: 51mins  Signed: [facility] 12/30/2011, 10:31 AM

## 2011-12-30 NOTE — Progress Notes (Signed)
PIV removed without complaint, patient discharged home. Patient verbalizes understanding of discharge instructions, prescriptions and follow up care. Patient escorted out by staff, transported by family.

## 2012-01-19 ENCOUNTER — Other Ambulatory Visit (HOSPITAL_COMMUNITY): Payer: Self-pay | Admitting: Family Medicine

## 2012-01-19 ENCOUNTER — Ambulatory Visit (HOSPITAL_COMMUNITY)
Admission: RE | Admit: 2012-01-19 | Discharge: 2012-01-19 | Disposition: A | Discharge status: Home / Self Care | Payer: Medicare Other | Source: Ambulatory Visit | Attending: Family Medicine | Admitting: Family Medicine

## 2012-01-19 DIAGNOSIS — R112 Nausea with vomiting, unspecified: Secondary | ICD-10-CM | POA: Insufficient documentation

## 2012-01-21 ENCOUNTER — Encounter (INDEPENDENT_AMBULATORY_CARE_PROVIDER_SITE_OTHER): Payer: Self-pay | Admitting: *Deleted

## 2012-01-22 ENCOUNTER — Encounter (HOSPITAL_COMMUNITY): Payer: Self-pay

## 2012-01-22 ENCOUNTER — Emergency Department (HOSPITAL_COMMUNITY)
Admission: EM | Admit: 2012-01-22 | Discharge: 2012-01-22 | Disposition: A | Discharge status: Home / Self Care | Payer: Medicare Other | Attending: Emergency Medicine | Admitting: Emergency Medicine

## 2012-01-22 ENCOUNTER — Emergency Department (HOSPITAL_COMMUNITY): Payer: Medicare Other

## 2012-01-22 DIAGNOSIS — R112 Nausea with vomiting, unspecified: Secondary | ICD-10-CM | POA: Insufficient documentation

## 2012-01-22 DIAGNOSIS — I4891 Unspecified atrial fibrillation: Secondary | ICD-10-CM | POA: Insufficient documentation

## 2012-01-22 DIAGNOSIS — E7800 Pure hypercholesterolemia, unspecified: Secondary | ICD-10-CM | POA: Insufficient documentation

## 2012-01-22 DIAGNOSIS — E039 Hypothyroidism, unspecified: Secondary | ICD-10-CM | POA: Insufficient documentation

## 2012-01-22 DIAGNOSIS — R142 Eructation: Secondary | ICD-10-CM | POA: Insufficient documentation

## 2012-01-22 DIAGNOSIS — R918 Other nonspecific abnormal finding of lung field: Secondary | ICD-10-CM | POA: Insufficient documentation

## 2012-01-22 DIAGNOSIS — Z7982 Long term (current) use of aspirin: Secondary | ICD-10-CM | POA: Insufficient documentation

## 2012-01-22 DIAGNOSIS — K5900 Constipation, unspecified: Secondary | ICD-10-CM | POA: Insufficient documentation

## 2012-01-22 DIAGNOSIS — Z7901 Long term (current) use of anticoagulants: Secondary | ICD-10-CM | POA: Insufficient documentation

## 2012-01-22 DIAGNOSIS — R141 Gas pain: Secondary | ICD-10-CM | POA: Insufficient documentation

## 2012-01-22 DIAGNOSIS — K219 Gastro-esophageal reflux disease without esophagitis: Secondary | ICD-10-CM | POA: Insufficient documentation

## 2012-01-22 DIAGNOSIS — K644 Residual hemorrhoidal skin tags: Secondary | ICD-10-CM | POA: Insufficient documentation

## 2012-01-22 DIAGNOSIS — Z79899 Other long term (current) drug therapy: Secondary | ICD-10-CM | POA: Insufficient documentation

## 2012-01-22 LAB — BASIC METABOLIC PANEL
BUN: 14 mg/dL (ref 6–23)
CO2: 26 mEq/L (ref 19–32)
Calcium: 9.8 mg/dL (ref 8.4–10.5)
Chloride: 97 mEq/L (ref 96–112)
Creatinine, Ser: 0.83 mg/dL (ref 0.50–1.10)
GFR calc Af Amer: 72 mL/min — ABNORMAL LOW (ref >90)
GFR calc non Af Amer: 62 mL/min — ABNORMAL LOW (ref >90)
Glucose, Bld: 133 mg/dL — ABNORMAL HIGH (ref 70–99)
Potassium: 4 mEq/L (ref 3.5–5.1)
Sodium: 134 mEq/L — ABNORMAL LOW (ref 135–145)

## 2012-01-22 LAB — CBC
HCT: 36.4 % (ref 36.0–46.0)
Hemoglobin: 12 g/dL (ref 12.0–15.0)
MCH: 30.8 pg (ref 26.0–34.0)
MCHC: 33 g/dL (ref 30.0–36.0)
MCV: 93.3 fL (ref 78.0–100.0)
Platelets: 273 10*3/uL (ref 150–400)
RBC: 3.9 MIL/uL (ref 3.87–5.11)
RDW: 12.3 % (ref 11.5–15.5)
WBC: 17 10*3/uL — ABNORMAL HIGH (ref 4.0–10.5)

## 2012-01-22 LAB — PROTIME-INR
INR: 2.83 — ABNORMAL HIGH (ref 0.00–1.49)
Prothrombin Time: 30.2 seconds — ABNORMAL HIGH (ref 11.6–15.2)

## 2012-01-22 MED ORDER — MAGNESIUM CITRATE PO SOLN
1.0000 | Freq: Once | ORAL | Status: AC
  Administered 2012-01-22: 1 via ORAL
  Filled 2012-01-22: qty 296

## 2012-01-22 MED ORDER — MOXIFLOXACIN HCL 400 MG PO TABS: 400.0000 mg | ORAL_TABLET | Freq: Every day | ORAL | Status: AC

## 2012-01-22 MED ORDER — POLYETHYLENE GLYCOL 3350 17 GM/SCOOP PO POWD: 17.0000 g | Freq: Two times a day (BID) | ORAL | Status: AC

## 2012-01-22 MED ORDER — IOHEXOL 300 MG/ML  SOLN
100.0000 mL | Freq: Once | INTRAMUSCULAR | Status: AC | PRN
  Administered 2012-01-22: 100 mL via INTRAVENOUS

## 2012-01-22 MED ORDER — FLEET ENEMA 7-19 GM/118ML RE ENEM
1.0000 | ENEMA | Freq: Once | RECTAL | Status: AC
  Administered 2012-01-22: 1 via RECTAL

## 2012-01-22 MED ORDER — SODIUM CHLORIDE 0.9 % IV SOLN
Freq: Once | INTRAVENOUS | Status: AC
  Administered 2012-01-22: 16:00:00 via INTRAVENOUS

## 2012-01-22 NOTE — ED Notes (Signed)
Patient had loose bowel movement with minimal hard pieces. Patient states "I feel much better" no needs voiced by patient at this time.

## 2012-01-22 NOTE — ED Notes (Signed)
Reports no BM all week; was seen by PCP Monday of this week, and was told she was full of stool.  Unable to tell exactly when last BM was. Alert, answers questions appropriately; in no distress.

## 2012-01-22 NOTE — ED Notes (Signed)
Pt up to bedside commode

## 2012-01-22 NOTE — ED Provider Notes (Signed)
  I performed a history and physical examination of Leslie Rosario and discussed her management with Dr. Barbra Sarks.  I agree with the history, physical, assessment, and plan of care, with the following exceptions: None  Increasing constipation over the past 1.5 weeks. Recent admission to the hospital for diarrhea.  Abdomen soft, nontender.  I was present for the following procedures: None Time Spent in Critical Care of the patient: None Time spent in discussions with the patient and family: 10 min  Imaging relatively unremarkable. They did comment on the possible infection in the right lower lobe. She'll be placed on an antibiotic. Instructed to followup  Mariel Sleet, MD 01/22/12 1736

## 2012-01-22 NOTE — ED Notes (Signed)
Up to bedside commode; instructed to call for assistance when finished.

## 2012-01-22 NOTE — ED Provider Notes (Signed)
History     CSN: [id number]  Arrival date & time 01/22/12  1338   First MD Initiated Contact with Patient 01/22/12 1342      Chief Complaint  Patient presents with  . Constipation    (Consider location/radiation/quality/duration/timing/severity/associated sxs/prior treatment) HPI Leslie Rosario presents complaining of constipation.  She says she has not had a good bowel movement in more than a week.  A few weeks ago, she had nausea and vomiting and was kept overnight.  She says that she saw her PCP on Monday, who got a KUB, and called and told her to start Miralax.  She has also been doing an over the counter suppository.  When she does the suppository, a small amount of stool comes out, but she does not feel relief.    Patient denies history of abdominal surgery, small bowel obstruction, fevers, chills.  She denies any real abdominal pain, complains only of fullness.   Past Medical History  Diagnosis Date  . GERD (gastroesophageal reflux disease)   . Thyroid disease     hypothyroidism  . Atrial fibrillation   . High cholesterol     Past Surgical History  Procedure Date  . Tonsillectomy     Family History  Problem Relation Age of Onset  . Cancer Mother   . Cancer Father   . Aortic aneurysm    . Cancer Brother     History  Substance Use Topics  . Smoking status: Never Smoker   . Smokeless tobacco: Not on file  . Alcohol Use: No    Review of Systems  Constitutional: Negative for fever.  HENT: Negative for congestion.   Eyes: Negative for visual disturbance.  Respiratory: Negative for shortness of breath.   Cardiovascular: Negative for chest pain.  Gastrointestinal: Positive for vomiting, constipation and abdominal distention. Negative for abdominal pain.  Genitourinary: Negative for dysuria.  Skin: Negative for rash.  Neurological: Negative for weakness.  Hematological: Does not bruise/bleed easily.    Allergies  Codeine and Sulfonamide derivatives  Home  Medications   Current Outpatient Rx  Name Route Sig Dispense Refill  . ASPIRIN EC 81 MG PO TBEC Oral Take 81 mg by mouth daily.    Marland Kitchen CALCIUM CARBONATE 1250 MG PO TABS Oral Take 1 tablet by mouth daily.    . OMEGA-3 FATTY ACIDS 1000 MG PO CAPS Oral Take 1 g by mouth daily.    Marland Kitchen LEVOTHYROXINE SODIUM 50 MCG PO TABS Oral Take 50 mcg by mouth daily.      Marland Kitchen LORAZEPAM 0.5 MG PO TABS Oral Take 0.5 mg by mouth at bedtime. For sleep    . METOPROLOL SUCCINATE ER 25 MG PO TB24 Oral Take 12.5 mg by mouth daily.     . CENTRUM SILVER PO Oral Take 1 tablet by mouth daily.    Marland Kitchen OMEPRAZOLE 20 MG PO CPDR Oral Take 20 mg by mouth daily.      Marland Kitchen SIMVASTATIN 20 MG PO TABS Oral Take 20 mg by mouth daily.    Marland Kitchen TIMOLOL HEMIHYDRATE 0.5 % OP SOLN Right Eye Place 1 drop into the right eye 2 (two) times daily.    . WARFARIN SODIUM 3 MG PO TABS Oral Take 3-4.5 mg by mouth as directed. 1.5 tab on Monday Wednesday 1 tab all other days      BP 121/47  Pulse 78  Temp(Src) 97.9 F (36.6 C) (Oral)  Resp 18  Ht 5\' 4"  (1.626 m)  Wt 139 lb 8 oz (63.277  kg)  BMI 23.95 kg/m2  SpO2 96%  Physical Exam  Constitutional: She appears well-developed and well-nourished. No distress.  HENT:  Head: Normocephalic and atraumatic.  Mouth/Throat: Oropharynx is clear and moist.  Eyes: EOM are normal. Pupils are equal, round, and reactive to light.  Neck: Normal range of motion. Neck supple.  Cardiovascular: Normal rate, regular rhythm and normal heart sounds.   Pulmonary/Chest: Effort normal and breath sounds normal.  Abdominal: Bowel sounds are normal. She exhibits distension. There is generalized tenderness.  Genitourinary: Rectal exam shows external hemorrhoid. Rectal exam shows no internal hemorrhoid, no fissure, no mass, no tenderness and anal tone normal. Guaiac negative stool.  Musculoskeletal: She exhibits no edema.    ED Course  Procedures (including critical care time)  Labs Reviewed  CBC - Abnormal; Notable for the  following:    WBC 17.0 (*)    All other components within normal limits  PROTIME-INR - Abnormal; Notable for the following:    Prothrombin Time 30.2 (*)    INR 2.83 (*)    All other components within normal limits  BASIC METABOLIC PANEL - Abnormal; Notable for the following:    Sodium 134 (*)    Glucose, Bld 133 (*)    GFR calc non Af Amer 62 (*)    GFR calc Af Amer 72 (*)    All other components within normal limits   No results found.   No diagnosis found.    MDM  Patient presents with constipation, and had KUB earlier this week.   WBC elevated, will obtain CT scan.          Cletus Gash, MD 01/22/12 [phone]

## 2012-01-22 NOTE — ED Notes (Signed)
Patient had large bowel movement, MD aware of results. Patient back in bed and no needs voiced at this time.

## 2012-01-22 NOTE — ED Notes (Signed)
Pt states she went to her doctor Monday and had an x-ray. States she was told she is constipated.

## 2012-02-17 ENCOUNTER — Ambulatory Visit (INDEPENDENT_AMBULATORY_CARE_PROVIDER_SITE_OTHER): Payer: Medicare Other | Admitting: Internal Medicine

## 2012-02-17 ENCOUNTER — Encounter (INDEPENDENT_AMBULATORY_CARE_PROVIDER_SITE_OTHER): Payer: Self-pay | Admitting: Internal Medicine

## 2012-02-17 DIAGNOSIS — K5900 Constipation, unspecified: Secondary | ICD-10-CM | POA: Insufficient documentation

## 2012-02-17 MED ORDER — POLYETHYLENE GLYCOL 3350 17 G PO PACK: 17.0000 g | PACK | Freq: Two times a day (BID) | ORAL | Status: DC

## 2012-02-17 NOTE — Patient Instructions (Signed)
Take MiraLax or polyethylene glycol 17 g twice daily or 17 g every morning and 8.5 g every evening. Keep stool diary until office visit. Can use suppository on days when you do not have good bowel movements or evacuation.

## 2012-02-17 NOTE — Progress Notes (Signed)
Reason for consultation; Intractable constipation. History of present illness; Patient is 78 year old Caucasian female who is referred through courtesy of Ms. Delman Cheadle PAC/Dr. Riley Kill for GI evaluation. She presents with complaints of chronic constipation and medication not working. She may go 3 or 4 days without a bowel movement. She has been on MiraLax 17 g daily but it has stopped working. She doubled up on dose of MiraLax and a suggestion of a friend and she has noted good result. She had a normal bowel movement yesterday and today. She is trying to consume fiber rich foods and also eats few prunes every morning. About 4 weeks ago she developed fecal impaction and ended up in the emergency room. While in emergency room she had abdominopelvic CT and lab studies all of which were unremarkable. She also had single episode of nausea and vomiting at that time. She has maintained very good appetite and her weight has been stable. She denies melena or rectal bleeding. She has been using fleets suppositories quite often and they seem to work. She denies dysphagia. She has occasional/sporadic heartburn while on medication. Patient's last colonoscopy was in October 2007 revealing left colonic diverticulosis, external hemorrhoids 5 mm polyp removed from descending colon was a tubular adenoma. Her first colonoscopy was in 2001 and may have had a polyp or polyps removed. His exam was done in [place] and records are not available. Current Medications: Current Outpatient Prescriptions  Medication Sig Dispense Refill  . ALPRAZolam (XANAX) 0.5 MG tablet Take 0.5 mg by mouth at bedtime as needed. Patient states that she takes 1 1/2 every night to help with sleep      . aspirin EC 81 MG tablet Take 81 mg by mouth daily.      . calcium carbonate (OS-CAL - DOSED IN MG OF ELEMENTAL CALCIUM) 1250 MG tablet Take 1 tablet by mouth daily.      . fish oil-omega-3 fatty acids 1000 MG capsule Take 1 g by mouth daily.       Marland Kitchen levothyroxine (SYNTHROID, LEVOTHROID) 50 MCG tablet Take 50 mcg by mouth daily.        . metoprolol (TOPROL-XL) 25 MG 24 hr tablet Take 12.5 mg by mouth daily.       . Multiple Vitamins-Minerals (CENTRUM SILVER PO) Take 1 tablet by mouth daily.      Marland Kitchen omeprazole (PRILOSEC) 20 MG capsule Take 20 mg by mouth daily.        . polyethylene glycol (MIRALAX / GLYCOLAX) packet Take 17 g by mouth daily. Patient states that on 02-16-12 she started taking 17 grams twice daily, with excellent results      . simvastatin (ZOCOR) 20 MG tablet Take 10 mg by mouth daily.       . timolol (BETIMOL) 0.5 % ophthalmic solution Place 1 drop into the right eye 2 (two) times daily.      Marland Kitchen warfarin (COUMADIN) 3 MG tablet Take 3-4.5 mg by mouth as directed. 1.5 tab on Monday Wednesday 1 tab all other days       Past medical history; Hyperlipidemia. Hypothyroidism. Atrial fibrillation. Anxiety. Chronic GERD. She at EGD in October 2007 at the time of colonoscopy revealing mild changes of reflux esophagitis and a soft stricture at GE junction which was dilated with 54 French Maloney dilator and she also had esophageal web. Tonsillectomy at age 58. Bilateral cataract extraction. 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  Allergies; Codeine and sulfa  Family history; Father was diagnosed with stomach cancer age  67; had surgery and died 8 years later. Mother died at age 64 of uterine and/or ovarian cancer. She had one sister who died in her sleep at age 12.  Social history; She is retired from [employer]. She lost her husband and 2009. She does not smoke cigarettes or drink alcohol. She has 3 sons one had surgery for renal cell carcinoma 15 years ago and is doing fine. One son has sleep apnea and the third one is in good health  Physical exam; Blood pressure 110/76, pulse 70, temperature 97.4 F (36.3 C), temperature source Oral, resp. rate 14, height 5\' 4"  (1.626 m), weight 140 lb 4.8 oz (63.64 kg). Pleasant  well-developed well-nourished Caucasian female who appears younger than stated age. Conjunctiva is pink. Sclera is nonicteric Oropharyngeal mucosa is normal. No neck masses or thyromegaly noted. Cardiac exam with regular rhythm normal S1 and S2. No murmur or gallop noted. Lungs are clear to auscultation. Abdomen is full. Bowel sounds are normal. On palpation abdomen is very soft without tenderness organomegaly or masses. Rectal exam reveals normal sphincter tone. She is.counseled in the vault which is guaiac-negative. No LE edema or clubbing noted.  Labs/studies Results: From 01/19/2012. H&H is 13.2 and 40.6. Platelet count 358K. Chemistry panel is normal serum calcium 9.2 with albumin of 4.23. TSH from 12/10/2011 was 4.086. Abdominopelvic CT films from 12/29/2011 reviewed. Small infiltrate at right lower lobe but no abnormality to small or large bowel. No evidence of ascites or adenopathy.  Assessment: Chronic constipation appears to be due to colonic dysmotility. She appears to be on appropriate dose of levothyroxine and her serum calcium levels have been normal. She does not have any alarm symptoms and her stool is guaiac negative. It appears she is doing better with double dose of MiraLax which will be continued for the next few weeks and to our office. I do not feel there is any indication or need to do colonoscopy at the present time. #2. Chronic GERD. She is doing well with therapy   Recommendations; Continue high fiber diet. Continue MiraLax 17 g by mouth twice a day or 17 g in a.m. and 8.5 g in p.m. Use suppository on days she does not have good evacuation. Keep stool tarry as instructed until office visit in 4-6 weeks. Thank you for the opportunity to positive in the care of this nice lady.

## 2012-03-05 ENCOUNTER — Encounter (INDEPENDENT_AMBULATORY_CARE_PROVIDER_SITE_OTHER): Payer: Self-pay

## 2012-04-14 HISTORY — PX: TRANSTHORACIC ECHOCARDIOGRAM: SHX275

## 2012-05-03 ENCOUNTER — Other Ambulatory Visit (HOSPITAL_COMMUNITY): Payer: Self-pay | Admitting: Internal Medicine

## 2012-05-03 DIAGNOSIS — R1084 Generalized abdominal pain: Secondary | ICD-10-CM

## 2012-05-06 ENCOUNTER — Ambulatory Visit (HOSPITAL_COMMUNITY)
Admission: RE | Admit: 2012-05-06 | Discharge: 2012-05-06 | Disposition: A | Discharge status: Home / Self Care | Payer: Medicare Other | Source: Ambulatory Visit | Attending: Internal Medicine | Admitting: Internal Medicine

## 2012-05-06 DIAGNOSIS — R1084 Generalized abdominal pain: Secondary | ICD-10-CM

## 2012-05-06 DIAGNOSIS — R109 Unspecified abdominal pain: Secondary | ICD-10-CM | POA: Insufficient documentation

## 2012-05-18 ENCOUNTER — Ambulatory Visit (INDEPENDENT_AMBULATORY_CARE_PROVIDER_SITE_OTHER): Payer: Medicare Other | Admitting: Internal Medicine

## 2012-05-18 ENCOUNTER — Encounter (INDEPENDENT_AMBULATORY_CARE_PROVIDER_SITE_OTHER): Payer: Self-pay | Admitting: Internal Medicine

## 2012-05-18 VITALS — BP 110/68 | HR 70 | Temp 97.1°F | Resp 18 | Ht 64.0 in | Wt 143.7 lb

## 2012-05-18 DIAGNOSIS — K5900 Constipation, unspecified: Secondary | ICD-10-CM

## 2012-05-18 NOTE — Progress Notes (Signed)
Presenting complaint;  Followup for constipation. Subjective:  Patient is 78 year old Caucasian female with chronic constipation and was last seen on 02/17/2012. She did not experience good results with MiraLax and was given samples of Amitiza. It worked until she ran out of samples. In the meantime she is advised to take Peri-Colace by a friend. She has been using it for the past few weeks and states it's working quite well and she is not having any side effects. She wants to stay on this medication rather than take prescription because of cost. She denies abdominal pain melena rectal bleeding nausea or vomiting. Her appetite is good.  Current Medications: Current Outpatient Prescriptions  Medication Sig Dispense Refill  . ALPRAZolam (XANAX) 0.5 MG tablet Take 0.5 mg by mouth at bedtime as needed. Patient states that she takes 1 1/2 every night to help with sleep      . aspirin EC 81 MG tablet Take 81 mg by mouth daily.      Marland Kitchen bismuth subsalicylate (PEPTO BISMOL) 262 MG/15ML suspension Take 15 mLs by mouth as needed.      . calcium carbonate (OS-CAL - DOSED IN MG OF ELEMENTAL CALCIUM) 1250 MG tablet Take 1 tablet by mouth daily.      Sarajane Marek Sodium 30-100 MG CAPS Take by mouth. As needed      . fish oil-omega-3 fatty acids 1000 MG capsule Take 1 g by mouth daily.      Marland Kitchen levothyroxine (SYNTHROID, LEVOTHROID) 50 MCG tablet Take 50 mcg by mouth daily.        . metoprolol (TOPROL-XL) 25 MG 24 hr tablet Take 12.5 mg by mouth daily.       . Multiple Vitamins-Minerals (CENTRUM SILVER PO) Take 1 tablet by mouth daily.      Marland Kitchen omeprazole (PRILOSEC) 20 MG capsule Take 20 mg by mouth daily.        . Psyllium (NATURAL FIBER PO) Take by mouth. Patient states that she is taking 1 TBS daily      . simvastatin (ZOCOR) 20 MG tablet Take 10 mg by mouth daily.       . timolol (BETIMOL) 0.5 % ophthalmic solution Place 1 drop into the right eye 2 (two) times daily.      Marland Kitchen warfarin (COUMADIN) 3 MG  tablet Take 3-4.5 mg by mouth as directed. Patient states that she is taking 3 mg once a day      . polyethylene glycol (MIRALAX / GLYCOLAX) packet Take 17 g by mouth 2 (two) times daily. Patient states that on 02-16-12 she started taking 17 grams twice daily, with excellent results  255 each  11     Objective: Blood pressure 110/68, pulse 70, temperature 97.1 F (36.2 C), temperature source Oral, resp. rate 18, height 5\' 4"  (1.626 m), weight 143 lb 11.2 oz (65.182 kg). Patient appears comfortable and was able to move easily from chair to examination table. Conjunctiva is pink. Sclera is nonicteric Oropharyngeal mucosa is normal. No neck masses or thyromegaly noted. Abdomen is soft and nontender without organomegaly or masses.  No LE edema or clubbing noted.   Assessment:  Chronic constipation with dependence on laxatives. She is having good results with Peri-Colace which was given to her by one of her friends and she is not having any side effects. Therefore we'll stick with this treatment as long as it is working and she is not having any side effects.   Plan:  Continue high fiber diet, fiber supplement and  Peri-Colace one tablet by mouth twice a day. Dulcolax or glycerin suppository as needed. Office visit in 6 months.

## 2012-05-18 NOTE — Patient Instructions (Signed)
Continue high fiber diet fiber supplement and laxative as before. Can use glycerin or Dulcolax suppository every other day as needed.

## 2012-07-18 ENCOUNTER — Emergency Department (HOSPITAL_COMMUNITY): Payer: Medicare Other

## 2012-07-18 ENCOUNTER — Encounter (HOSPITAL_COMMUNITY): Payer: Self-pay | Admitting: Emergency Medicine

## 2012-07-18 ENCOUNTER — Inpatient Hospital Stay (HOSPITAL_COMMUNITY)
Admission: EM | Admit: 2012-07-18 | Discharge: 2012-07-19 | DRG: 690 | Disposition: A | Discharge status: Home / Self Care | Payer: Medicare Other | Attending: Internal Medicine | Admitting: Internal Medicine

## 2012-07-18 DIAGNOSIS — Z7901 Long term (current) use of anticoagulants: Secondary | ICD-10-CM

## 2012-07-18 DIAGNOSIS — N390 Urinary tract infection, site not specified: Principal | ICD-10-CM | POA: Diagnosis present

## 2012-07-18 DIAGNOSIS — E039 Hypothyroidism, unspecified: Secondary | ICD-10-CM | POA: Diagnosis present

## 2012-07-18 DIAGNOSIS — Z8719 Personal history of other diseases of the digestive system: Secondary | ICD-10-CM

## 2012-07-18 DIAGNOSIS — I4891 Unspecified atrial fibrillation: Secondary | ICD-10-CM | POA: Diagnosis present

## 2012-07-18 DIAGNOSIS — N179 Acute kidney failure, unspecified: Secondary | ICD-10-CM | POA: Diagnosis present

## 2012-07-18 DIAGNOSIS — R631 Polydipsia: Secondary | ICD-10-CM

## 2012-07-18 DIAGNOSIS — R1032 Left lower quadrant pain: Secondary | ICD-10-CM

## 2012-07-18 DIAGNOSIS — W19XXXA Unspecified fall, initial encounter: Secondary | ICD-10-CM | POA: Diagnosis present

## 2012-07-18 DIAGNOSIS — Z809 Family history of malignant neoplasm, unspecified: Secondary | ICD-10-CM

## 2012-07-18 DIAGNOSIS — S7010XA Contusion of unspecified thigh, initial encounter: Secondary | ICD-10-CM | POA: Diagnosis present

## 2012-07-18 DIAGNOSIS — E860 Dehydration: Secondary | ICD-10-CM | POA: Diagnosis present

## 2012-07-18 DIAGNOSIS — E7800 Pure hypercholesterolemia, unspecified: Secondary | ICD-10-CM | POA: Diagnosis present

## 2012-07-18 DIAGNOSIS — Z886 Allergy status to analgesic agent status: Secondary | ICD-10-CM

## 2012-07-18 DIAGNOSIS — R1310 Dysphagia, unspecified: Secondary | ICD-10-CM

## 2012-07-18 DIAGNOSIS — K219 Gastro-esophageal reflux disease without esophagitis: Secondary | ICD-10-CM | POA: Diagnosis present

## 2012-07-18 DIAGNOSIS — E785 Hyperlipidemia, unspecified: Secondary | ICD-10-CM

## 2012-07-18 DIAGNOSIS — F411 Generalized anxiety disorder: Secondary | ICD-10-CM

## 2012-07-18 DIAGNOSIS — Z882 Allergy status to sulfonamides status: Secondary | ICD-10-CM

## 2012-07-18 DIAGNOSIS — I959 Hypotension, unspecified: Secondary | ICD-10-CM | POA: Diagnosis present

## 2012-07-18 LAB — CBC WITH DIFFERENTIAL/PLATELET
Basophils Absolute: 0 10*3/uL (ref 0.0–0.1)
Basophils Relative: 0 % (ref 0–1)
Eosinophils Absolute: 0.2 10*3/uL (ref 0.0–0.7)
Eosinophils Relative: 1 % (ref 0–5)
HCT: 37.1 % (ref 36.0–46.0)
Hemoglobin: 12.6 g/dL (ref 12.0–15.0)
Lymphocytes Relative: 6 % — ABNORMAL LOW (ref 12–46)
Lymphs Abs: 1.3 10*3/uL (ref 0.7–4.0)
MCH: 30.6 pg (ref 26.0–34.0)
MCHC: 34 g/dL (ref 30.0–36.0)
MCV: 90 fL (ref 78.0–100.0)
Monocytes Absolute: 1.7 10*3/uL — ABNORMAL HIGH (ref 0.1–1.0)
Monocytes Relative: 7 % (ref 3–12)
Neutro Abs: 20.6 10*3/uL — ABNORMAL HIGH (ref 1.7–7.7)
Neutrophils Relative %: 87 % — ABNORMAL HIGH (ref 43–77)
Platelets: 201 10*3/uL (ref 150–400)
RBC: 4.12 MIL/uL (ref 3.87–5.11)
RDW: 13.5 % (ref 11.5–15.5)
WBC: 23.8 10*3/uL — ABNORMAL HIGH (ref 4.0–10.5)

## 2012-07-18 LAB — URINE MICROSCOPIC-ADD ON

## 2012-07-18 LAB — LACTIC ACID, PLASMA: Lactic Acid, Venous: 3 mmol/L — ABNORMAL HIGH (ref 0.5–2.2)

## 2012-07-18 LAB — URINALYSIS, ROUTINE W REFLEX MICROSCOPIC
Glucose, UA: NEGATIVE mg/dL
Ketones, ur: NEGATIVE mg/dL
Nitrite: NEGATIVE
Protein, ur: NEGATIVE mg/dL
Specific Gravity, Urine: 1.02 (ref 1.005–1.030)
Urobilinogen, UA: 0.2 mg/dL (ref 0.0–1.0)
pH: 5.5 (ref 5.0–8.0)

## 2012-07-18 LAB — BASIC METABOLIC PANEL
BUN: 23 mg/dL (ref 6–23)
CO2: 25 mEq/L (ref 19–32)
Calcium: 9.2 mg/dL (ref 8.4–10.5)
Chloride: 95 mEq/L — ABNORMAL LOW (ref 96–112)
Creatinine, Ser: 1.28 mg/dL — ABNORMAL HIGH (ref 0.50–1.10)
GFR calc Af Amer: 42 mL/min — ABNORMAL LOW (ref >90)
GFR calc non Af Amer: 36 mL/min — ABNORMAL LOW (ref >90)
Glucose, Bld: 150 mg/dL — ABNORMAL HIGH (ref 70–99)
Potassium: 3.8 mEq/L (ref 3.5–5.1)
Sodium: 131 mEq/L — ABNORMAL LOW (ref 135–145)

## 2012-07-18 LAB — TROPONIN I: Troponin I: <0.3 ng/mL (ref <0.30)

## 2012-07-18 LAB — PROTIME-INR
INR: 2.24 — ABNORMAL HIGH (ref 0.00–1.49)
Prothrombin Time: 25.2 seconds — ABNORMAL HIGH (ref 11.6–15.2)

## 2012-07-18 LAB — PROCALCITONIN: Procalcitonin: 2.02 ng/mL

## 2012-07-18 MED ORDER — SODIUM CHLORIDE 0.9 % IV BOLUS (SEPSIS): 250.0000 mL | Freq: Once | INTRAVENOUS | Status: AC

## 2012-07-18 MED ORDER — SODIUM CHLORIDE 0.9 % IJ SOLN
3.0000 mL | Freq: Two times a day (BID) | INTRAMUSCULAR | Status: DC
  Administered 2012-07-19: 3 mL via INTRAVENOUS
  Filled 2012-07-18: qty 3

## 2012-07-18 MED ORDER — TIMOLOL HEMIHYDRATE 0.5 % OP SOLN
1.0000 [drp] | Freq: Two times a day (BID) | OPHTHALMIC | Status: DC
  Filled 2012-07-18: qty 0.1

## 2012-07-18 MED ORDER — POLYETHYLENE GLYCOL 3350 17 GM/SCOOP PO POWD
17.0000 g | Freq: Every day | ORAL | Status: DC
  Administered 2012-07-18: 17 g via ORAL
  Filled 2012-07-18: qty 255

## 2012-07-18 MED ORDER — ONDANSETRON HCL 4 MG/2ML IJ SOLN: 4.0000 mg | Freq: Four times a day (QID) | INTRAMUSCULAR | Status: DC | PRN

## 2012-07-18 MED ORDER — SIMVASTATIN 10 MG PO TABS
10.0000 mg | ORAL_TABLET | Freq: Every day | ORAL | Status: DC
  Administered 2012-07-18 – 2012-07-19 (×2): 10 mg via ORAL
  Filled 2012-07-18 (×5): qty 1

## 2012-07-18 MED ORDER — DEXTROSE 5 % IV SOLN
1.0000 g | INTRAVENOUS | Status: DC
  Administered 2012-07-19: 1 g via INTRAVENOUS
  Filled 2012-07-18: qty 10

## 2012-07-18 MED ORDER — DEXTROSE 5 % IV SOLN
1.0000 g | Freq: Once | INTRAVENOUS | Status: AC
  Administered 2012-07-18: 1 g via INTRAVENOUS
  Filled 2012-07-18: qty 10

## 2012-07-18 MED ORDER — WARFARIN SODIUM 3 MG PO TABS: 3.0000 mg | ORAL_TABLET | ORAL | Status: DC

## 2012-07-18 MED ORDER — ONDANSETRON HCL 4 MG PO TABS: 4.0000 mg | ORAL_TABLET | Freq: Four times a day (QID) | ORAL | Status: DC | PRN

## 2012-07-18 MED ORDER — LEVOTHYROXINE SODIUM 50 MCG PO TABS
50.0000 ug | ORAL_TABLET | Freq: Every day | ORAL | Status: DC
  Administered 2012-07-19: 50 ug via ORAL
  Filled 2012-07-18 (×4): qty 1

## 2012-07-18 MED ORDER — BISMUTH SUBSALICYLATE 262 MG/15ML PO SUSP
15.0000 mL | ORAL | Status: DC | PRN
  Filled 2012-07-18 (×2): qty 236

## 2012-07-18 MED ORDER — SODIUM CHLORIDE 0.9 % IV SOLN
INTRAVENOUS | Status: DC
  Administered 2012-07-18 – 2012-07-19 (×3): via INTRAVENOUS

## 2012-07-18 MED ORDER — PANTOPRAZOLE SODIUM 40 MG PO TBEC: 40.0000 mg | DELAYED_RELEASE_TABLET | Freq: Every day | ORAL | Status: DC

## 2012-07-18 MED ORDER — SODIUM CHLORIDE 0.9 % IJ SOLN
INTRAMUSCULAR | Status: AC
  Administered 2012-07-18: 22:00:00
  Filled 2012-07-18: qty 3

## 2012-07-18 MED ORDER — ASPIRIN EC 81 MG PO TBEC
81.0000 mg | DELAYED_RELEASE_TABLET | Freq: Every day | ORAL | Status: DC
  Administered 2012-07-18: 81 mg via ORAL
  Filled 2012-07-18 (×3): qty 1

## 2012-07-18 MED ORDER — WARFARIN SODIUM 1 MG PO TABS
3.0000 mg | ORAL_TABLET | Freq: Once | ORAL | Status: AC
  Administered 2012-07-18: 3 mg via ORAL
  Filled 2012-07-18 (×2): qty 1

## 2012-07-18 MED ORDER — WARFARIN - PHARMACIST DOSING INPATIENT: Freq: Every day | Status: DC

## 2012-07-18 MED ORDER — ALPRAZOLAM 0.5 MG PO TABS
0.5000 mg | ORAL_TABLET | Freq: Every evening | ORAL | Status: DC | PRN
  Administered 2012-07-18: 0.5 mg via ORAL
  Filled 2012-07-18: qty 1

## 2012-07-18 MED ORDER — POLYETHYLENE GLYCOL 3350 17 G PO PACK
PACK | ORAL | Status: AC
  Filled 2012-07-18: qty 1

## 2012-07-18 NOTE — H&P (Addendum)
[facility] History and Physical  Leslie Rosario [id number] DOB: 09/02/1923 DOA: 07/18/2012 PCP: Glo Herring., MD   Chief Complaint: Chills,low back pain,fall with LOC  HPI:  77 yr old lady with above symptoms which started 2 days ago.Went to see PCP who diagnosed UTI and RX antibiotics.Next day,pt had a sycopal fall,LOC for less than 1 min.  Review of Systems:  Negative   Past Medical History  Diagnosis Date  . Thyroid disease     hypothyroidism  . Atrial fibrillation   . High cholesterol   . Constipation   . Nausea & vomiting   . GERD (gastroesophageal reflux disease)    Past Surgical History  Procedure Date  . Tonsillectomy   . Colonoscopy   . Upper gastrointestinal endoscopy    Social History:  reports that she has never smoked. She has never used smokeless tobacco. She reports that she does not drink alcohol or use illicit drugs. Widower.Lives with son.Walks with walker.Independent.  Allergies  Allergen Reactions  . Adhesive (Tape) Other (See Comments)    REACTION: pulls skin  . Codeine     REACTION: UNKNOWN REACTION  . Prednisone Other (See Comments)    REACTION: UNKNOWN  . Sulfonamide Derivatives     REACTION: UNKNOWN REACTION    Family History  Problem Relation Age of Onset  . Cancer Mother   . Ovarian cancer Mother   . Cancer Father   . Aortic aneurysm    . Cancer Brother   . Emphysema Brother   . Emphysema Brother   . Pneumonia Brother   . Aneurysm Brother   . Healthy Son   . Sleep apnea Son     Prior to Admission medications   Medication Sig Start Date End Date Taking? Authorizing Provider  ALPRAZolam Duanne Moron) 0.5 MG tablet Take 0.5 mg by mouth at bedtime as needed. Patient states that she takes 1 1/2 every night to help with sleep   Yes Historical Provider, MD  aspirin EC 81 MG tablet Take 81 mg by mouth at bedtime.    Yes Historical Provider, MD  bismuth subsalicylate (PEPTO BISMOL) 262 MG/15ML suspension Take 15 mLs by mouth as  needed. For upset stomach/ gas relief   Yes Historical Provider, MD  calcium carbonate (OS-CAL - DOSED IN MG OF ELEMENTAL CALCIUM) 1250 MG tablet Take 1 tablet by mouth daily.   Yes Historical Provider, MD  fish oil-omega-3 fatty acids 1000 MG capsule Take 1 g by mouth daily.   Yes Historical Provider, MD  levothyroxine (SYNTHROID, LEVOTHROID) 50 MCG tablet Take 50 mcg by mouth daily.    Yes Historical Provider, MD  metoprolol (TOPROL-XL) 25 MG 24 hr tablet Take 12.5 mg by mouth daily.    Yes Historical Provider, MD  Multiple Vitamins-Minerals (CENTRUM SILVER PO) Take 1 tablet by mouth daily.   Yes Historical Provider, MD  nitrofurantoin, macrocrystal-monohydrate, (MACROBID) 100 MG capsule Take 100 mg by mouth 2 (two) times daily. For 7 days 07/16/12  Yes Historical Provider, MD  omeprazole (PRILOSEC) 20 MG capsule Take 20 mg by mouth every morning.    Yes Historical Provider, MD  polyethylene glycol powder (GLYCOLAX/MIRALAX) powder Take 17 g by mouth daily.   Yes Historical Provider, MD  simvastatin (ZOCOR) 20 MG tablet Take 10 mg by mouth daily.    Yes Historical Provider, MD  timolol (BETIMOL) 0.5 % ophthalmic solution Place 1 drop into the right eye 2 (two) times daily.   Yes Historical Provider, MD  warfarin (COUMADIN) 3  MG tablet Take 3-4.5 mg by mouth as directed. *Take one tablet (3mg ) on all days except on Mondays and Friday. Take one & one-half tablet (4.5mg ) on Mondays and Fridays.* --Patient takes this medication at 4pm daily   Yes Historical Provider, MD   Physical Exam: Filed Vitals:   07/18/12 1542 07/18/12 1633 07/18/12 1634 07/18/12 1635  BP: 128/56 99/56 102/51 93/56  Pulse: 79 74 79 82  Temp:      TempSrc:      Resp:      Height:      Weight:      SpO2: 98%        General:  Looks well  Eyes: WNL  ENT: WNL  Neck: No lymphadenopathy  Cardiovascular: WNL  Respiratory: WNL  Abdomen: Soft,non tender.  Skin: No rash  Musculoskeletal: Tender lateral R thigh(when  she fell)  Psychiatric: Affect approriate.  Neurologic: No focal signs  Labs on Admission:  Basic Metabolic Panel:  Lab [id number] 1600  NA 131*  K 3.8  CL 95*  CO2 25  GLUCOSE 150*  BUN 23  CREATININE 1.28*  CALCIUM 9.2  MG --  PHOS --       CBC:  Lab 07/18/12 1600  WBC 23.8*  NEUTROABS 20.6*  HGB 12.6  HCT 37.1  MCV 90.0  PLT 201   Cardiac Enzymes:  Lab 07/18/12 1600  CKTOTAL --  CKMB --  CKMBINDEX --  TROPONINI <0.30       Radiological Exams on Admission: Dg Chest 2 View  07/18/2012  *RADIOLOGY REPORT*  Clinical Data: Weakness.  Rule out infiltrate.  CHEST - 2 VIEW  Comparison: 12/24/2010  Findings: Heart size is normal.  No pleural effusion or edema. Coarsened interstitial markings are noted bilaterally.  No airspace consolidation.  IMPRESSION:  1.  No acute cardiopulmonary abnormalities. 2.  Chronic interstitial coarsening.  Original Report Authenticated By: Angelita Ingles, M.D.   Dg Lumbar Spine Complete  07/18/2012  *RADIOLOGY REPORT*  Clinical Data: Fall.  Leg pain  LUMBAR SPINE - COMPLETE 4+ VIEW  Comparison: None  Findings: Normal alignment of the lumbar spine.  There is mild multilevel disc space narrowing and ventral endplate spurring.  This is most severe at the L1-2 and L3-4 levels.  No fracture or subluxation identified.  Densely calcified atherosclerotic disease affects the abdominal aorta and its branches.  IMPRESSION:  1.  No acute findings. 2.  Lumbar spondylosis noted.  Original Report Authenticated By: Angelita Ingles, M.D.   Dg Hip Complete Right  07/18/2012  *RADIOLOGY REPORT*  Clinical Data: Status post fall  RIGHT HIP - COMPLETE 2+ VIEW  Comparison: None  Findings: There is mild osteoarthritis involving the right hip.  Fracture or subluxation identified.  There is no radio-opaque foreign body or soft tissue calcifications identified.  IMPRESSION:  1.  Mild osteoarthritis.  Original Report Authenticated By: Angelita Ingles, M.D.   Ct  Head Wo Contrast  07/18/2012  *RADIOLOGY REPORT*  Clinical Data: Weakness.  Fall.  CT HEAD WITHOUT CONTRAST  Technique:  Contiguous axial images were obtained from the base of the skull through the vertex without contrast.  Comparison: 08/27/2010  Findings: There is diffuse patchy low density throughout the subcortical and periventricular white matter consistent with chronic small vessel ischemic change.  There is prominence of the sulci and ventricles consistent with brain atrophy.  There is no evidence for acute brain infarct, hemorrhage or mass.  There is mild mucosal thickening involving the sphenoid sinus.  There is partial opacification of the left maxillary air cells. The right maxillary cells are clear.  The skull appears intact.  IMPRESSION:  1.  No acute intracranial abnormalities.  Small vessel ischemic disease and brain atrophy. 2.  Sphenoid sinus and left maxillary air cell opacification.  Original Report Authenticated By: Angelita Ingles, M.D.   Dg Knee Complete 4 Views Right  07/18/2012  *RADIOLOGY REPORT*  Clinical Data: Fall, pain  RIGHT KNEE - COMPLETE 4+ VIEW  Comparison: 07/18/2012  Findings: Bones are osteopenic.  Normal alignment without fracture or effusion.  Preserved joint spaces.  No significant arthropathy. Peripheral vascular calcifications evident.  IMPRESSION: Osteopenia.  No acute osseous finding  Original Report Authenticated By: Jerilynn Mages. Daryll Brod, M.D.      Assessment/Plan Principal Problem:  *UTI (urinary tract infection) Active Problems:  Atrial fibrillation  Dehydration   1. UTI 2. DEHYDRATION 3. ARF secondary to 1 WITH HYPOTENSION  Code Status: FULL Family Communication: Discussed plan with pateint Disposition Plan: [place]  Time spent: [facility] Pager [phone]  If 7PM-7AM, please contact night-coverage [email or web address] Password [facility] 07/18/2012, 6:03 PM

## 2012-07-18 NOTE — ED Notes (Addendum)
Pt fell out of bed yesterday morning and hurt r lateral leg. C/o weakness all over. Usually walks alone but has been using walker. Pt is alert/oriented. Denies hitting head or LOC. Seen pcp x 2 days ago and given antibiotic for UTI. Went to dr because of back pain

## 2012-07-18 NOTE — ED Notes (Signed)
Report called to floor. Pt ready for transfer. VSS.

## 2012-07-18 NOTE — ED Provider Notes (Signed)
History     CSN: [id number]  Arrival date & time 07/18/12  1313   First MD Initiated Contact with Patient 07/18/12 1520      Chief Complaint  Patient presents with  . Weakness  . Fall     HPI Pt was seen at 1530.  Per pt, c/o gradual onset and worsening of persistent generalized weakness/fatigue since yesterday.  Pt states she was eval by her PMD 2 days ago for c/o left lower back pain for the past 1 month, dx uti, rx macrobid.  Pt states she fell at home yesterday due to feeling generally "weak."  States she "might have passed out" for a "short time."  Pt states she fell onto her right side, c/o persistent right thigh/hip pain.  Pt states she laid on the floor for several hours before family came to help her stand up.  Endorses she stayed in bed all day yesterday.  States her symptoms continue today.  Denies CP/palpitations, no SOB/cough, no abd pain, no fevers, no N/V/D, no focal motor weakness, no tingling/numbness in extremities.    Past Medical History  Diagnosis Date  . Thyroid disease     hypothyroidism  . Atrial fibrillation   . High cholesterol   . Constipation   . Nausea & vomiting   . GERD (gastroesophageal reflux disease)     Past Surgical History  Procedure Date  . Tonsillectomy   . Colonoscopy   . Upper gastrointestinal endoscopy     Family History  Problem Relation Age of Onset  . Cancer Mother   . Ovarian cancer Mother   . Cancer Father   . Aortic aneurysm    . Cancer Brother   . Emphysema Brother   . Emphysema Brother   . Pneumonia Brother   . Aneurysm Brother   . Healthy Son   . Sleep apnea Son     History  Substance Use Topics  . Smoking status: Never Smoker   . Smokeless tobacco: Never Used  . Alcohol Use: No    Review of Systems ROS: Statement: All systems negative except as marked or noted in the HPI; Constitutional: +generalized weakness/fatigue. Negative for fever and chills. ; ; Eyes: Negative for eye pain, redness and discharge. ; ;  ENMT: Negative for ear pain, hoarseness, nasal congestion, sinus pressure and sore throat. ; ; Cardiovascular: Negative for chest pain, palpitations, diaphoresis, dyspnea and peripheral edema. ; ; Respiratory: Negative for cough, wheezing and stridor. ; ; Gastrointestinal: Negative for nausea, vomiting, diarrhea, abdominal pain, blood in stool, hematemesis, jaundice and rectal bleeding. . ; ; Genitourinary: Negative for dysuria, flank pain and hematuria. ; ; Musculoskeletal: +LBP, right hip/thigh pain. Negative for neck pain. Negative for deformity.; ; Skin: +bruising. Negative for pruritus, rash, abrasions, blisters, and skin lesion.; ; Neuro: Negative for headache, lightheadedness and neck stiffness. Negative for altered level of consciousness , altered mental status, extremity weakness, paresthesias, involuntary movement, seizure and syncope.     Allergies  Adhesive; Codeine; Prednisone; and Sulfonamide derivatives  Home Medications   Current Outpatient Rx  Name Route Sig Dispense Refill  . ALPRAZOLAM 0.5 MG PO TABS Oral Take 0.5 mg by mouth at bedtime as needed. Patient states that she takes 1 1/2 every night to help with sleep    . ASPIRIN EC 81 MG PO TBEC Oral Take 81 mg by mouth at bedtime.     Marland Kitchen BISMUTH SUBSALICYLATE 99991111 [id number] PO SUSP Oral Take 15 mLs by mouth as needed. For upset  stomach/ gas relief    . CALCIUM CARBONATE 1250 MG PO TABS Oral Take 1 tablet by mouth daily.    . OMEGA-3 FATTY ACIDS 1000 MG PO CAPS Oral Take 1 g by mouth daily.    Marland Kitchen LEVOTHYROXINE SODIUM 50 MCG PO TABS Oral Take 50 mcg by mouth daily.     Marland Kitchen METOPROLOL SUCCINATE ER 25 MG PO TB24 Oral Take 12.5 mg by mouth daily.     . CENTRUM SILVER PO Oral Take 1 tablet by mouth daily.    Marland Kitchen NITROFURANTOIN MONOHYD MACRO 100 MG PO CAPS Oral Take 100 mg by mouth 2 (two) times daily. For 7 days    . OMEPRAZOLE 20 MG PO CPDR Oral Take 20 mg by mouth every morning.     Marland Kitchen POLYETHYLENE GLYCOL 3350 PO POWD Oral Take 17 g by mouth  daily.    Marland Kitchen SIMVASTATIN 20 MG PO TABS Oral Take 10 mg by mouth daily.     Marland Kitchen TIMOLOL HEMIHYDRATE 0.5 % OP SOLN Right Eye Place 1 drop into the right eye 2 (two) times daily.    . WARFARIN SODIUM 3 MG PO TABS Oral Take 3-4.5 mg by mouth as directed. *Take one tablet (3mg ) on all days except on Mondays and Friday. Take one & one-half tablet (4.5mg ) on Mondays and Fridays.* --Patient takes this medication at 4pm daily      BP 93/56  Pulse 82  Temp 97.8 F (36.6 C) (Oral)  Resp 16  Ht 5\' 4"  (1.626 m)  Wt 142 lb (64.411 kg)  BMI 24.37 kg/m2  SpO2 98%  Physical Exam 1535: Physical examination:  Nursing notes reviewed; Vital signs and O2 SAT reviewed;  Constitutional: Well developed, Well nourished, In no acute distress; Head:  Normocephalic, atraumatic; Eyes: EOMI, PERRL, No scleral icterus; ENMT: Mouth and pharynx normal, Mucous membranes dry; Neck: Supple, Full range of motion, No lymphadenopathy; Cardiovascular: Irregular irregular rate and rhythm, No gallop; Respiratory: Breath sounds clear & equal bilaterally, No wheezes.  Speaking full sentences with ease, Normal respiratory effort/excursion; Chest: Nontender, Movement normal; Abdomen: Soft, Nontender, Nondistended, Normal bowel sounds; Genitourinary: No CVA tenderness; Spine:  No midline CS, TS, LS tenderness.;; Extremities: Pulses normal, No tenderness, No edema, No calf edema or asymmetry.; Neuro: AA&Ox3, Major CN grossly intact.  Speech clear. No facial droop, gait steady. Climbs on and off stretcher by herself without difficulty. No gross focal motor or sensory deficits in extremities.; Skin: Color normal, Warm, Dry.   ED Course  Procedures   1630:  SBP 90's on arrival.  +orthostatic.  Will give judicious IVF bolus given her age.  Pt continues to mentate per baseline, resps easy.  1730:  Pt's SBP slowly improving with judicious IVF boluses.  SBP in 100's, continues to mentate per baseline, resps easy, talking with family at bedside.   +UTI, UC pending.  Will start IV rocephin.  Dx testing d/w pt and family.  Questions answered.  Verb understanding, agreeable to admit. T/C to [facility]. Anastasio Champion, case discussed, including:  HPI, pertinent PM/SHx, VS/PE, dx testing, ED course and treatment:  Agreeable to admit, he will come to the ED for eval.     MDM  MDM Reviewed: previous chart, nursing note and vitals Reviewed previous: ECG Interpretation: labs, ECG, x-ray and CT scan Total time providing critical care: 30-74 minutes. This excludes time spent performing separately reportable procedures and services. Consults: admitting MD   CRITICAL CARE Performed by: Alfonzo Feller Total critical care time: 35 Critical  care time was exclusive of separately billable procedures and treating other patients. Critical care was necessary to treat or prevent imminent or life-threatening deterioration. Critical care was time spent personally by me on the following activities: development of treatment plan with patient and/or surrogate as well as nursing, discussions with consultants, evaluation of patient's response to treatment, examination of patient, obtaining history from patient or surrogate, ordering and performing treatments and interventions, ordering and review of laboratory studies, ordering and review of radiographic studies, pulse oximetry and re-evaluation of patient's condition.    Date: 07/18/2012  Rate: 73  Rhythm: atrial flutter  QRS Axis: normal  Intervals: normal  ST/T Wave abnormalities: normal  Conduction Disutrbances:none  Narrative Interpretation:   Old EKG Reviewed: changes noted; aflutter is new compared to previous EKG dated 12/24/2010.  Results for orders placed during the hospital encounter of [id number]  BASIC METABOLIC PANEL      Component Value Range   Sodium 131 (*) 135 - 145 mEq/L   Potassium 3.8  3.5 - 5.1 mEq/L   Chloride 95 (*) 96 - 112 mEq/L   CO2 25  19 - 32 mEq/L   Glucose, Bld 150 (*) 70 - 99  mg/dL   BUN 23  6 - 23 mg/dL   Creatinine, Ser 1.28 (*) 0.50 - 1.10 mg/dL   Calcium 9.2  8.4 - 10.5 mg/dL   GFR calc non Af Amer 36 (*) >90 mL/min   GFR calc Af Amer 42 (*) >90 mL/min  CBC WITH DIFFERENTIAL      Component Value Range   WBC 23.8 (*) 4.0 - 10.5 K/uL   RBC 4.12  3.87 - 5.11 MIL/uL   Hemoglobin 12.6  12.0 - 15.0 g/dL   HCT 37.1  36.0 - 46.0 %   MCV 90.0  78.0 - 100.0 fL   MCH 30.6  26.0 - 34.0 pg   MCHC 34.0  30.0 - 36.0 g/dL   RDW 13.5  11.5 - 15.5 %   Platelets 201  150 - 400 K/uL   Neutrophils Relative 87 (*) 43 - 77 %   Neutro Abs 20.6 (*) 1.7 - 7.7 K/uL   Lymphocytes Relative 6 (*) 12 - 46 %   Lymphs Abs 1.3  0.7 - 4.0 K/uL   Monocytes Relative 7  3 - 12 %   Monocytes Absolute 1.7 (*) 0.1 - 1.0 K/uL   Eosinophils Relative 1  0 - 5 %   Eosinophils Absolute 0.2  0.0 - 0.7 K/uL   Basophils Relative 0  0 - 1 %   Basophils Absolute 0.0  0.0 - 0.1 K/uL  LACTIC ACID, PLASMA      Component Value Range   Lactic Acid, Venous 3.0 (*) 0.5 - 2.2 mmol/L  PROCALCITONIN      Component Value Range   Procalcitonin 2.02    URINALYSIS, ROUTINE W REFLEX MICROSCOPIC      Component Value Range   Color, Urine YELLOW  YELLOW   APPearance HAZY (*) CLEAR   Specific Gravity, Urine 1.020  1.005 - 1.030   pH 5.5  5.0 - 8.0   Glucose, UA NEGATIVE  NEGATIVE mg/dL   Hgb urine dipstick TRACE (*) NEGATIVE   Bilirubin Urine SMALL (*) NEGATIVE   Ketones, ur NEGATIVE  NEGATIVE mg/dL   Protein, ur NEGATIVE  NEGATIVE mg/dL   Urobilinogen, UA 0.2  0.0 - 1.0 mg/dL   Nitrite NEGATIVE  NEGATIVE   Leukocytes, UA TRACE (*) NEGATIVE  TROPONIN I  Component Value Range   Troponin I <0.30  <0.30 ng/mL  PROTIME-INR      Component Value Range   Prothrombin Time 25.2 (*) 11.6 - 15.2 seconds   INR 2.24 (*) 0.00 - 1.49  URINE MICROSCOPIC-ADD ON      Component Value Range   Squamous Epithelial / LPF RARE  RARE   WBC, UA 11-20  <3 WBC/hpf   RBC / HPF 3-6  <3 RBC/hpf   Bacteria, UA FEW (*)  RARE   Dg Chest 2 View 07/18/2012  *RADIOLOGY REPORT*  Clinical Data: Weakness.  Rule out infiltrate.  CHEST - 2 VIEW  Comparison: 12/24/2010  Findings: Heart size is normal.  No pleural effusion or edema. Coarsened interstitial markings are noted bilaterally.  No airspace consolidation.  IMPRESSION:  1.  No acute cardiopulmonary abnormalities. 2.  Chronic interstitial coarsening.  Original Report Authenticated By: Angelita Ingles, M.D.   Dg Lumbar Spine Complete 07/18/2012  *RADIOLOGY REPORT*  Clinical Data: Fall.  Leg pain  LUMBAR SPINE - COMPLETE 4+ VIEW  Comparison: None  Findings: Normal alignment of the lumbar spine.  There is mild multilevel disc space narrowing and ventral endplate spurring.  This is most severe at the L1-2 and L3-4 levels.  No fracture or subluxation identified.  Densely calcified atherosclerotic disease affects the abdominal aorta and its branches.  IMPRESSION:  1.  No acute findings. 2.  Lumbar spondylosis noted.  Original Report Authenticated By: Angelita Ingles, M.D.   Dg Hip Complete Right 07/18/2012  *RADIOLOGY REPORT*  Clinical Data: Status post fall  RIGHT HIP - COMPLETE 2+ VIEW  Comparison: None  Findings: There is mild osteoarthritis involving the right hip.  Fracture or subluxation identified.  There is no radio-opaque foreign body or soft tissue calcifications identified.  IMPRESSION:  1.  Mild osteoarthritis.  Original Report Authenticated By: Angelita Ingles, M.D.   Ct Head Wo Contrast 07/18/2012  *RADIOLOGY REPORT*  Clinical Data: Weakness.  Fall.  CT HEAD WITHOUT CONTRAST  Technique:  Contiguous axial images were obtained from the base of the skull through the vertex without contrast.  Comparison: 08/27/2010  Findings: There is diffuse patchy low density throughout the subcortical and periventricular white matter consistent with chronic small vessel ischemic change.  There is prominence of the sulci and ventricles consistent with brain atrophy.  There is no evidence  for acute brain infarct, hemorrhage or mass.  There is mild mucosal thickening involving the sphenoid sinus. There is partial opacification of the left maxillary air cells. The right maxillary cells are clear.  The skull appears intact.  IMPRESSION:  1.  No acute intracranial abnormalities.  Small vessel ischemic disease and brain atrophy. 2.  Sphenoid sinus and left maxillary air cell opacification.  Original Report Authenticated By: Angelita Ingles, M.D.   Dg Knee Complete 4 Views Right 07/18/2012  *RADIOLOGY REPORT*  Clinical Data: Fall, pain  RIGHT KNEE - COMPLETE 4+ VIEW  Comparison: 07/18/2012  Findings: Bones are osteopenic.  Normal alignment without fracture or effusion.  Preserved joint spaces.  No significant arthropathy. Peripheral vascular calcifications evident.  IMPRESSION: Osteopenia.  No acute osseous finding  Original Report Authenticated By: Jerilynn Mages. Daryll Brod, M.D.             Alfonzo Feller, DO 07/19/12 1501

## 2012-07-18 NOTE — Progress Notes (Signed)
[facility] for Warfarin Indication: atrial fibrillation  Allergies  Allergen Reactions  . Adhesive (Tape) Other (See Comments)    REACTION: pulls skin  . Codeine     REACTION: UNKNOWN REACTION  . Prednisone Other (See Comments)    REACTION: UNKNOWN  . Sulfonamide Derivatives     REACTION: UNKNOWN REACTION    Patient Measurements: Height: 5\' 4"  (162.6 cm) Weight: 142 lb (64.411 kg) IBW/kg (Calculated) : 54.7   Vital Signs: Temp: 97.8 F (36.6 C) (08/04 1320) Temp src: Oral (08/04 1320) BP: 93/56 mmHg (08/04 1635) Pulse Rate: 82  (08/04 1635)  Labs:  Basename 07/18/12 1600  HGB 12.6  HCT 37.1  PLT 201  APTT --  LABPROT 25.2*  INR 2.24*  HEPARINUNFRC --  CREATININE 1.28*  CKTOTAL --  CKMB --  TROPONINI <0.30    Estimated Creatinine Clearance: 26.7 ml/min (by C-G formula based on Cr of 1.28).   Medical History: Past Medical History  Diagnosis Date  . Thyroid disease     hypothyroidism  . Atrial fibrillation   . High cholesterol   . Constipation   . Nausea & vomiting   . GERD (gastroesophageal reflux disease)     Medications:   (Not in a hospital admission)  Assessment: Okay for Protocol Warfarin at home for A.Fib.  Goal of Therapy:  INR 2-3   Plan:  Warfarin 3mg  PO x 1 today. Daily PT/INR.  Pricilla Larsson 07/18/2012,6:05 PM

## 2012-07-18 NOTE — ED Notes (Signed)
Unable to call report to floor at present, RN to call when able to take report.

## 2012-07-19 ENCOUNTER — Ambulatory Visit (INDEPENDENT_AMBULATORY_CARE_PROVIDER_SITE_OTHER): Payer: Medicare Other | Admitting: Internal Medicine

## 2012-07-19 LAB — COMPREHENSIVE METABOLIC PANEL
ALT: 39 U/L — ABNORMAL HIGH (ref 0–35)
AST: 62 U/L — ABNORMAL HIGH (ref 0–37)
Albumin: 2.6 g/dL — ABNORMAL LOW (ref 3.5–5.2)
Alkaline Phosphatase: 93 U/L (ref 39–117)
BUN: 22 mg/dL (ref 6–23)
CO2: 25 mEq/L (ref 19–32)
Calcium: 8.1 mg/dL — ABNORMAL LOW (ref 8.4–10.5)
Chloride: 104 mEq/L (ref 96–112)
Creatinine, Ser: 1.04 mg/dL (ref 0.50–1.10)
GFR calc Af Amer: 54 mL/min — ABNORMAL LOW (ref >90)
GFR calc non Af Amer: 47 mL/min — ABNORMAL LOW (ref >90)
Glucose, Bld: 104 mg/dL — ABNORMAL HIGH (ref 70–99)
Potassium: 3.5 mEq/L (ref 3.5–5.1)
Sodium: 136 mEq/L (ref 135–145)
Total Bilirubin: 1 mg/dL (ref 0.3–1.2)
Total Protein: 5.6 g/dL — ABNORMAL LOW (ref 6.0–8.3)

## 2012-07-19 LAB — CBC
HCT: 33.9 % — ABNORMAL LOW (ref 36.0–46.0)
Hemoglobin: 11.6 g/dL — ABNORMAL LOW (ref 12.0–15.0)
MCH: 30.4 pg (ref 26.0–34.0)
MCHC: 34.2 g/dL (ref 30.0–36.0)
MCV: 89 fL (ref 78.0–100.0)
Platelets: 192 10*3/uL (ref 150–400)
RBC: 3.81 MIL/uL — ABNORMAL LOW (ref 3.87–5.11)
RDW: 13.5 % (ref 11.5–15.5)
WBC: 14.8 10*3/uL — ABNORMAL HIGH (ref 4.0–10.5)

## 2012-07-19 LAB — PROTIME-INR
INR: 2.82 — ABNORMAL HIGH (ref 0.00–1.49)
Prothrombin Time: 30.1 seconds — ABNORMAL HIGH (ref 11.6–15.2)

## 2012-07-19 MED ORDER — TIMOLOL MALEATE 0.5 % OP SOLN
1.0000 [drp] | Freq: Two times a day (BID) | OPHTHALMIC | Status: DC
  Administered 2012-07-19: 1 [drp] via OPHTHALMIC
  Filled 2012-07-19: qty 5

## 2012-07-19 MED ORDER — POLYETHYLENE GLYCOL 3350 17 G PO PACK
17.0000 g | PACK | Freq: Every day | ORAL | Status: DC
  Filled 2012-07-19: qty 1

## 2012-07-19 NOTE — Progress Notes (Signed)
Utilization Review Complete  

## 2012-07-19 NOTE — Progress Notes (Signed)
Discharge instructions reviewed with patient, patient voiced understanding. Patient given discharge instructions. Patient in stable condition and waiting on ride. Will continue to monitor.

## 2012-07-19 NOTE — Discharge Summary (Signed)
Physician Discharge Summary  Leslie Rosario [id number] DOB: [birth year] DOA: 07/18/2012  PCP: Glo Herring., MD  Admit date: 07/18/2012 Discharge date: 07/19/2012  Recommendations for Outpatient Follow-up:  1. Followup with primary care physician for monitoring of blood pressure.   Discharge Diagnoses:  1. UTI, clinically improved. 2. Dehydration and hypotension, improved. 3. Atrial fibrillation, stable. 4. Small bruising in the right thigh secondary to fall.   Discharge Condition: Stable and improved.  Diet recommendation: Regular.  Wt Readings from Last 3 Encounters:  07/18/12 64.093 kg (141 lb 4.8 oz)  05/18/12 65.182 kg (143 lb 11.2 oz)  02/17/12 63.64 kg (140 lb 4.8 oz)    History of present illness:  This very pleasant 78 year old lady was admitted with symptoms of chills, low back pain, fall with temporary loss of consciousness.  Hospital Course:  It was felt that her overall symptoms are related to UTI. She was clinically and biochemically dehydrated. She was given intravenous fluids and intravenous Rocephin. She is much improved today, feels better, creatinine is now normal. She has course of oral Macrobid at home that she can finish. Once she is able to mobilize in the hospital this morning, she can be discharged home safely. She does have a small bruise in the right lower thigh. This should improve with time.  Procedures:  None.  Consultations:  None.  Discharge Exam: Filed Vitals:   07/19/12 0521  BP: 94/66  Pulse: 77  Temp: 97.4 F (36.3 C)  Resp: 18   Filed Vitals:   07/18/12 1839 07/18/12 1915 07/18/12 2028 07/19/12 0521  BP: 123/76 130/56 121/72 94/66  Pulse: 85 94 92 77  Temp: 97.6 F (36.4 C) 98 F (36.7 C) 98.7 F (37.1 C) 97.4 F (36.3 C)  TempSrc: Axillary Oral Oral Oral  Resp:  20 20 18   Height:   5\' 4"  (1.626 m)   Weight:   64.093 kg (141 lb 4.8 oz)   SpO2: 94% 92% 94% 92%    General: She looks systemically well. She is not toxic  or septic. Cardiovascular: Heart sounds are present and normal. Respiratory: Lung fields are clear. Alert and oriented without any focal neurologic signs.  Discharge Instructions  Discharge Orders    Future Appointments: Provider: Department: Dept Phone: Center:   11/15/2012 2:00 PM Rogene Houston, MD [facility] [phone] None     Future Orders Please Complete By Expires   Diet - low sodium heart healthy      Increase activity slowly        Medication List  As of 07/19/2012  8:09 AM   TAKE these medications         ALPRAZolam 0.5 MG tablet   Commonly known as: XANAX   Take 0.5 mg by mouth at bedtime as needed. Patient states that she takes 1 1/2 every night to help with sleep      aspirin EC 81 MG tablet   Take 81 mg by mouth at bedtime.      bismuth subsalicylate 99991111 [id number] suspension   Commonly known as: PEPTO BISMOL   Take 15 mLs by mouth as needed. For upset stomach/ gas relief      calcium carbonate 1250 MG tablet   Commonly known as: OS-CAL - dosed in mg of elemental calcium   Take 1 tablet by mouth daily.      CENTRUM SILVER PO   Take 1 tablet by mouth daily.      fish oil-omega-3 fatty acids 1000 MG  capsule   Take 1 g by mouth daily.      levothyroxine 50 MCG tablet   Commonly known as: SYNTHROID, LEVOTHROID   Take 50 mcg by mouth daily.      metoprolol succinate 25 MG 24 hr tablet   Commonly known as: TOPROL-XL   Take 12.5 mg by mouth daily.      nitrofurantoin (macrocrystal-monohydrate) 100 MG capsule   Commonly known as: MACROBID   Take 100 mg by mouth 2 (two) times daily. For 7 days      omeprazole 20 MG capsule   Commonly known as: PRILOSEC   Take 20 mg by mouth every morning.      polyethylene glycol powder powder   Commonly known as: GLYCOLAX/MIRALAX   Take 17 g by mouth daily.      simvastatin 20 MG tablet   Commonly known as: ZOCOR   Take 10 mg by mouth daily.      timolol 0.5 % ophthalmic solution   Commonly known as:  BETIMOL   Place 1 drop into the right eye 2 (two) times daily.      warfarin 3 MG tablet   Commonly known as: COUMADIN   Take 3-4.5 mg by mouth as directed. *Take one tablet (3mg ) on all days except on Mondays and Friday. Take one & one-half tablet (4.5mg ) on Mondays and Fridays.* --Patient takes this medication at 4pm daily              The results of significant diagnostics from this hospitalization (including imaging, microbiology, ancillary and laboratory) are listed below for reference.    Significant Diagnostic Studies: Dg Chest 2 View  07/18/2012  *RADIOLOGY REPORT*  Clinical Data: Weakness.  Rule out infiltrate.  CHEST - 2 VIEW  Comparison: 12/24/2010  Findings: Heart size is normal.  No pleural effusion or edema. Coarsened interstitial markings are noted bilaterally.  No airspace consolidation.  IMPRESSION:  1.  No acute cardiopulmonary abnormalities. 2.  Chronic interstitial coarsening.  Original Report Authenticated By: Angelita Ingles, M.D.   Dg Lumbar Spine Complete  07/18/2012  *RADIOLOGY REPORT*  Clinical Data: Fall.  Leg pain  LUMBAR SPINE - COMPLETE 4+ VIEW  Comparison: None  Findings: Normal alignment of the lumbar spine.  There is mild multilevel disc space narrowing and ventral endplate spurring.  This is most severe at the L1-2 and L3-4 levels.  No fracture or subluxation identified.  Densely calcified atherosclerotic disease affects the abdominal aorta and its branches.  IMPRESSION:  1.  No acute findings. 2.  Lumbar spondylosis noted.  Original Report Authenticated By: Angelita Ingles, M.D.   Dg Hip Complete Right  07/18/2012  *RADIOLOGY REPORT*  Clinical Data: Status post fall  RIGHT HIP - COMPLETE 2+ VIEW  Comparison: None  Findings: There is mild osteoarthritis involving the right hip.  Fracture or subluxation identified.  There is no radio-opaque foreign body or soft tissue calcifications identified.  IMPRESSION:  1.  Mild osteoarthritis.  Original Report  Authenticated By: Angelita Ingles, M.D.   Ct Head Wo Contrast  07/18/2012  *RADIOLOGY REPORT*  Clinical Data: Weakness.  Fall.  CT HEAD WITHOUT CONTRAST  Technique:  Contiguous axial images were obtained from the base of the skull through the vertex without contrast.  Comparison: 08/27/2010  Findings: There is diffuse patchy low density throughout the subcortical and periventricular white matter consistent with chronic small vessel ischemic change.  There is prominence of the sulci and ventricles consistent with brain atrophy.  There is no evidence for acute brain infarct, hemorrhage or mass.  There is mild mucosal thickening involving the sphenoid sinus. There is partial opacification of the left maxillary air cells. The right maxillary cells are clear.  The skull appears intact.  IMPRESSION:  1.  No acute intracranial abnormalities.  Small vessel ischemic disease and brain atrophy. 2.  Sphenoid sinus and left maxillary air cell opacification.  Original Report Authenticated By: Angelita Ingles, M.D.   Dg Knee Complete 4 Views Right  07/18/2012  *RADIOLOGY REPORT*  Clinical Data: Fall, pain  RIGHT KNEE - COMPLETE 4+ VIEW  Comparison: 07/18/2012  Findings: Bones are osteopenic.  Normal alignment without fracture or effusion.  Preserved joint spaces.  No significant arthropathy. Peripheral vascular calcifications evident.  IMPRESSION: Osteopenia.  No acute osseous finding  Original Report Authenticated By: Jerilynn Mages. Daryll Brod, M.D.        Labs: Basic Metabolic Panel:  Lab 99991111 0459 07/18/12 1600  NA 136 131*  K 3.5 3.8  CL 104 95*  CO2 25 25  GLUCOSE 104* 150*  BUN 22 23  CREATININE 1.04 1.28*  CALCIUM 8.1* 9.2  MG -- --  PHOS -- --   Liver Function Tests:  Lab 07/19/12 0459  AST 62*  ALT 39*  ALKPHOS 93  BILITOT 1.0  PROT 5.6*  ALBUMIN 2.6*     CBC:  Lab 07/19/12 0459 07/18/12 1600  WBC 14.8* 23.8*  NEUTROABS -- 20.6*  HGB 11.6* 12.6  HCT 33.9* 37.1  MCV 89.0 90.0  PLT  192 201   Cardiac Enzymes:  Lab 07/18/12 1600  CKTOTAL --  CKMB --  CKMBINDEX --  TROPONINI <0.30     Time coordinating discharge: Less than 30 minutes  Signed:  GOSRANI,NIMISH C  [facility] 07/19/2012, 8:09 AM

## 2012-07-19 NOTE — Progress Notes (Signed)
Patient in stable condition and transported out by tech.  

## 2012-07-19 NOTE — Progress Notes (Signed)
Patient ambulated in hallway with tech. Patient tolerated well.

## 2012-07-21 LAB — URINE CULTURE: Colony Count: 50000

## 2012-07-28 ENCOUNTER — Other Ambulatory Visit (HOSPITAL_COMMUNITY): Payer: Self-pay | Admitting: Family Medicine

## 2012-07-28 ENCOUNTER — Ambulatory Visit (HOSPITAL_COMMUNITY)
Admission: RE | Admit: 2012-07-28 | Discharge: 2012-07-28 | Disposition: A | Discharge status: Home / Self Care | Payer: Medicare Other | Source: Ambulatory Visit | Attending: Family Medicine | Admitting: Family Medicine

## 2012-07-28 DIAGNOSIS — J069 Acute upper respiratory infection, unspecified: Secondary | ICD-10-CM

## 2012-07-28 DIAGNOSIS — R002 Palpitations: Secondary | ICD-10-CM | POA: Insufficient documentation

## 2012-11-15 ENCOUNTER — Ambulatory Visit (INDEPENDENT_AMBULATORY_CARE_PROVIDER_SITE_OTHER): Payer: Medicare Other | Admitting: Internal Medicine

## 2012-11-16 ENCOUNTER — Encounter (INDEPENDENT_AMBULATORY_CARE_PROVIDER_SITE_OTHER): Payer: Self-pay | Admitting: Internal Medicine

## 2012-11-16 ENCOUNTER — Ambulatory Visit (INDEPENDENT_AMBULATORY_CARE_PROVIDER_SITE_OTHER): Payer: Medicare Other | Admitting: Internal Medicine

## 2012-11-16 VITALS — BP 110/70 | HR 68 | Temp 96.7°F | Resp 18 | Ht 64.0 in | Wt 134.7 lb

## 2012-11-16 DIAGNOSIS — K5900 Constipation, unspecified: Secondary | ICD-10-CM

## 2012-11-16 NOTE — Patient Instructions (Addendum)
Try to eat fiber rich foods every day as discussed. Notify if medication for constipation stops working.

## 2012-11-16 NOTE — Progress Notes (Signed)
Presenting complaint;  Followup for constipation.  Subjective:  Patient is 78 year old Caucasian female who presents for scheduled visit. She was last seen 6 months ago. She has chronic constipation and presently taking OTC medications. She remains a very good appetite. She denies melena or rectal bleeding. Her heartburn is well controlled with PPI. Every time she comes off the medication she has bad heartburn. Patient states she was hospitalized in August 2013 for chills and fever and treated for urinary tract infection.   Current Medications: Current Outpatient Prescriptions  Medication Sig Dispense Refill  . ALPRAZolam (XANAX) 0.5 MG tablet Take 0.5 mg by mouth at bedtime as needed. Patient states that she takes 1 1/2 every night to help with sleep      . aspirin EC 81 MG tablet Take 81 mg by mouth at bedtime.       . bismuth subsalicylate (PEPTO BISMOL) 262 MG/15ML suspension Take 15 mLs by mouth as needed. For upset stomach/ gas relief      . calcium carbonate (OS-CAL - DOSED IN MG OF ELEMENTAL CALCIUM) 1250 MG tablet Take 1 tablet by mouth daily.      . clobetasol cream (TEMOVATE) [id number] % Apply 1 application topically 2 (two) times daily.       . fish oil-omega-3 fatty acids 1000 MG capsule Take 1 g by mouth daily.      Marland Kitchen levothyroxine (SYNTHROID, LEVOTHROID) 50 MCG tablet Take 50 mcg by mouth daily.       . metoprolol (TOPROL-XL) 25 MG 24 hr tablet Take 12.5 mg by mouth daily.       . Multiple Vitamins-Minerals (CENTRUM SILVER PO) Take 1 tablet by mouth daily.      Marland Kitchen MYRBETRIQ 50 MG TB24 50 mg daily. At bedtime      . omeprazole (PRILOSEC) 20 MG capsule Take 20 mg by mouth every morning.       . simvastatin (ZOCOR) 20 MG tablet Take 10 mg by mouth daily.       . timolol (BETIMOL) 0.5 % ophthalmic solution Place 1 drop into the right eye 2 (two) times daily.      . timolol (TIMOPTIC) 0.5 % ophthalmic solution Place 1 drop into the right eye 2 (two) times daily.       Marland Kitchen warfarin  (COUMADIN) 3 MG tablet Take 3 mg by mouth as directed. Take 1 1/2 tablet  3mg  every day except on Tuesday and Thursday  Patient takes 1 3mg  tablet         Objective: Blood pressure 110/70, pulse 68, temperature 96.7 F (35.9 C), temperature source Oral, resp. rate 18, height 5\' 4"  (1.626 m), weight 134 lb 11.2 oz (61.1 kg). Patient is alert and able to move from chair to examination table without any difficulty. Conjunctiva is pink. Sclera is nonicteric Oropharyngeal mucosa is normal. No neck masses or thyromegaly noted. Abdomen is soft and nontender without organomegaly or masses. No LE edema or clubbing noted.   Assessment:  Chronic constipation. She is dependent on laxatives. Will continue therapy as long as Peri-Colace keeps working   Plan:  Patient will continue high fiber. Continue Peri-Colace 1 by mouth twice a day. Call if constipation recurs. Office visit in one year.

## 2013-02-20 ENCOUNTER — Ambulatory Visit: Payer: Self-pay | Admitting: Cardiovascular Disease

## 2013-02-20 DIAGNOSIS — I4891 Unspecified atrial fibrillation: Secondary | ICD-10-CM

## 2013-02-20 DIAGNOSIS — Z7901 Long term (current) use of anticoagulants: Secondary | ICD-10-CM | POA: Insufficient documentation

## 2013-03-04 ENCOUNTER — Ambulatory Visit (INDEPENDENT_AMBULATORY_CARE_PROVIDER_SITE_OTHER): Payer: Medicare Other | Admitting: Urology

## 2013-03-04 DIAGNOSIS — N3941 Urge incontinence: Secondary | ICD-10-CM

## 2013-03-04 DIAGNOSIS — R300 Dysuria: Secondary | ICD-10-CM

## 2013-03-04 DIAGNOSIS — N952 Postmenopausal atrophic vaginitis: Secondary | ICD-10-CM

## 2013-05-03 ENCOUNTER — Ambulatory Visit (INDEPENDENT_AMBULATORY_CARE_PROVIDER_SITE_OTHER): Payer: Medicare Other | Admitting: Pharmacist Clinician (PhC)/ Clinical Pharmacy Specialist

## 2013-05-03 VITALS — BP 122/70

## 2013-05-03 DIAGNOSIS — I4891 Unspecified atrial fibrillation: Secondary | ICD-10-CM

## 2013-05-03 DIAGNOSIS — Z7901 Long term (current) use of anticoagulants: Secondary | ICD-10-CM

## 2013-05-03 LAB — POCT INR: INR: 2.5

## 2013-05-20 ENCOUNTER — Ambulatory Visit (INDEPENDENT_AMBULATORY_CARE_PROVIDER_SITE_OTHER): Payer: Medicare Other | Admitting: Urology

## 2013-05-20 DIAGNOSIS — R300 Dysuria: Secondary | ICD-10-CM

## 2013-05-20 DIAGNOSIS — N952 Postmenopausal atrophic vaginitis: Secondary | ICD-10-CM

## 2013-05-20 DIAGNOSIS — N3941 Urge incontinence: Secondary | ICD-10-CM

## 2013-08-26 ENCOUNTER — Ambulatory Visit (INDEPENDENT_AMBULATORY_CARE_PROVIDER_SITE_OTHER): Payer: Medicare Other | Admitting: Urology

## 2013-08-26 DIAGNOSIS — N3941 Urge incontinence: Secondary | ICD-10-CM

## 2013-08-26 DIAGNOSIS — R300 Dysuria: Secondary | ICD-10-CM

## 2013-08-26 DIAGNOSIS — N952 Postmenopausal atrophic vaginitis: Secondary | ICD-10-CM

## 2013-09-22 ENCOUNTER — Ambulatory Visit (INDEPENDENT_AMBULATORY_CARE_PROVIDER_SITE_OTHER): Payer: Medicare Other

## 2013-09-22 ENCOUNTER — Ambulatory Visit (INDEPENDENT_AMBULATORY_CARE_PROVIDER_SITE_OTHER): Payer: Medicare Other | Admitting: Obstetrics & Gynecology

## 2013-09-22 ENCOUNTER — Encounter: Payer: Self-pay | Admitting: Obstetrics & Gynecology

## 2013-09-22 VITALS — BP 130/80 | Ht 62.4 in | Wt 137.0 lb

## 2013-09-22 DIAGNOSIS — N949 Unspecified condition associated with female genital organs and menstrual cycle: Secondary | ICD-10-CM

## 2013-09-22 NOTE — Progress Notes (Signed)
Patient ID: Leslie Rosario, female   DOB: 11/10/1923, 79 y.o.   MRN: [id number] Leslie Rosario presents complaining of a three-day history of mid abdominal pain No fever No nausea and vomiting no diarrhea She had a normal bowel movement yesterday No recent change in appetite  Abdominal exam Her abdomen is a bit protuberant which has been in the past soft  I would classify her pain is mild with no rebound Pelvic reveals normal size uterus and ovaries to bimanual really can't feel her ovaries But no adnexal masses palpable  I ordered a sonogram Which is completely normal she is small amount of fluid in her endometrium both ovaries are normal size we looked at the gallbladder which was normal there is no free fluid in the pelvis or in the abdomen at all and no abnormal masses sitting  Impression Mild abdominal pain without associated sent Normal evaluation  Plan If her pain were to worsen or she developed other symptoms such as fever or other GI symptoms I recommend she go see her family doctors I call Right now see any pathology going on

## 2013-10-03 ENCOUNTER — Encounter (INDEPENDENT_AMBULATORY_CARE_PROVIDER_SITE_OTHER): Payer: Self-pay | Admitting: Internal Medicine

## 2013-10-03 ENCOUNTER — Ambulatory Visit (INDEPENDENT_AMBULATORY_CARE_PROVIDER_SITE_OTHER): Payer: Medicare Other | Admitting: Internal Medicine

## 2013-10-03 VITALS — BP 112/70 | HR 72 | Temp 96.9°F | Resp 18 | Ht 64.0 in | Wt 137.9 lb

## 2013-10-03 DIAGNOSIS — K219 Gastro-esophageal reflux disease without esophagitis: Secondary | ICD-10-CM

## 2013-10-03 DIAGNOSIS — K5900 Constipation, unspecified: Secondary | ICD-10-CM

## 2013-10-03 MED ORDER — SENNA-DOCUSATE SODIUM 8.6-50 MG PO TABS: 1.0000 | ORAL_TABLET | Freq: Every day | ORAL | Status: DC

## 2013-10-03 NOTE — Progress Notes (Signed)
Presenting complaint;  Followup for chronic constipation.  Subjective:  Patient is 79 year old Caucasian female who has chronic constipation and is here for scheduled visit. She was last seen in December 2013. Her bowels remain irregular. However she has anemia from 2-5 bowel movements per week. She may 3 days without a bowel movement and then she may have daily bowel movement for a few days. She has never gone more than 3 days without a bowel movement. He denies melena rectal bleeding anorexia or weight loss. She does complain of intermittent nausea experience on making up and quickly relieved with Pepto-Bismol. She never throws up. A few weeks ago she was evaluated by Dr. Elonda Husky and noted to have lower abdominal discomfort. She underwent pelvic ultrasound 09/22/2013 and was within normal limits. She denies abdominal pain today.  Current Medications: Current Outpatient Prescriptions  Medication Sig Dispense Refill  . ALPRAZolam (XANAX) 0.5 MG tablet Take 0.5 mg by mouth at bedtime as needed. Patient states that she takes 1 1/2 every night to help with sleep      . bismuth subsalicylate (PEPTO BISMOL) 262 MG/15ML suspension Take 15 mLs by mouth as needed. For upset stomach/ gas relief      . clobetasol cream (TEMOVATE) [id number] % Apply 1 application topically 2 (two) times daily.       . diclofenac sodium (VOLTAREN) 1 % GEL Apply 2 g topically 3 (three) times daily.      Marland Kitchen estradiol (ESTRACE) 0.1 MG/GM vaginal cream Place 2 g vaginally daily.      Marland Kitchen levothyroxine (SYNTHROID, LEVOTHROID) 50 MCG tablet Take 50 mcg by mouth daily.       . Lido-Capsaicin-Men-Methyl Sal (MEDI-PATCH-LIDOCAINE) 0.5-0.035-5-20 % PTCH Apply topically.      . metoprolol (TOPROL-XL) 25 MG 24 hr tablet Take 25 mg by mouth daily.       Marland Kitchen omeprazole (PRILOSEC) 20 MG capsule Take 20 mg by mouth every morning.       . simvastatin (ZOCOR) 20 MG tablet Take 10 mg by mouth daily.       . timolol (BETIMOL) 0.5 % ophthalmic solution Place  1 drop into the right eye 2 (two) times daily.      Marland Kitchen warfarin (COUMADIN) 3 MG tablet Take 3 mg by mouth as directed. Patient states that she is taking 3 mg daily       No current facility-administered medications for this visit.   Peri-Colace one tablet by mouth daily.   Objective: Blood pressure 112/70, pulse 72, temperature 96.9 F (36.1 C), temperature source Oral, resp. rate 18, height 5\' 4"  (1.626 m), weight 137 lb 14.4 oz (62.551 kg). Patient is alert and in no acute distress and appears younger than stated age. Conjunctiva is pink. Sclera is nonicteric Oropharyngeal mucosa is normal. No neck masses or thyromegaly noted. Abdomen is symmetrical. Bowel sounds are normal. On palpation abdomen is soft without tenderness organomegaly or masses. No LE edema or clubbing noted.    Assessment:  #1. Chronic constipation with dependence on laxatives. As long as Peri-Colace as working would not make any changes. However she needs to make sure that she has at least one bowel movement every 2 days. #2. Sporadic a.m. nausea possibly due to bile reflux. If symptoms occur more frequently would consider adding sucralfate at that time. .   Plan:  Patient advised to eat one apple and few prunes every day. Patient advised to use glycerin or Dulcolax suppository if she goes two days without a bowel movement.  Office visit in 6 months.

## 2013-10-03 NOTE — Patient Instructions (Signed)
Continue with high fiber diet. Try to eat one apple and some prunes daily. Use suppository on an as-needed basis. Goal is to have at least one BM every other day.

## 2014-01-05 ENCOUNTER — Ambulatory Visit: Payer: Self-pay | Admitting: Pharmacist Clinician (PhC)/ Clinical Pharmacy Specialist

## 2014-01-05 DIAGNOSIS — I4891 Unspecified atrial fibrillation: Secondary | ICD-10-CM

## 2014-01-05 DIAGNOSIS — Z7901 Long term (current) use of anticoagulants: Secondary | ICD-10-CM

## 2014-01-10 ENCOUNTER — Encounter: Payer: Self-pay | Admitting: *Deleted

## 2014-01-13 ENCOUNTER — Encounter: Payer: Self-pay | Admitting: Cardiovascular Disease

## 2014-01-16 ENCOUNTER — Encounter: Payer: Self-pay | Admitting: Cardiovascular Disease

## 2014-01-16 ENCOUNTER — Ambulatory Visit (INDEPENDENT_AMBULATORY_CARE_PROVIDER_SITE_OTHER): Payer: Medicare Other | Admitting: Cardiovascular Disease

## 2014-01-16 VITALS — BP 114/64 | HR 62 | Ht 64.0 in | Wt 136.6 lb

## 2014-01-16 DIAGNOSIS — I4891 Unspecified atrial fibrillation: Secondary | ICD-10-CM

## 2014-01-16 DIAGNOSIS — I059 Rheumatic mitral valve disease, unspecified: Secondary | ICD-10-CM

## 2014-01-16 DIAGNOSIS — K219 Gastro-esophageal reflux disease without esophagitis: Secondary | ICD-10-CM

## 2014-01-16 DIAGNOSIS — Z7901 Long term (current) use of anticoagulants: Secondary | ICD-10-CM

## 2014-01-16 DIAGNOSIS — I340 Nonrheumatic mitral (valve) insufficiency: Secondary | ICD-10-CM

## 2014-01-16 DIAGNOSIS — E785 Hyperlipidemia, unspecified: Secondary | ICD-10-CM

## 2014-01-16 DIAGNOSIS — I272 Pulmonary hypertension, unspecified: Secondary | ICD-10-CM

## 2014-01-16 DIAGNOSIS — I2789 Other specified pulmonary heart diseases: Secondary | ICD-10-CM

## 2014-01-16 NOTE — Patient Instructions (Signed)
Your physician recommends that you schedule a follow-up appointment in 1 YEAR.  

## 2014-01-16 NOTE — Progress Notes (Signed)
Patient ID: Leslie Rosario, female   DOB: 26-May-1923, 80 y.o.   MRN: [id number]     HPI: Leslie Rosario is a 80 y.o. female who presents for one-year cardiology evaluation.  Leslie Rosario is a history of permanent atrial fibrillation and is on Coumadin anticoagulation. In addition, she has a history of hypothyroidism on thyroid replacement and her recent Synthroid dose was reduced from 100 mcg to 75 mcg by her primary physician. There is also a history of hyperlipidemia for which he is on simvastatin 10 mg. There's also a history of hypertension as well as mild leg swelling.  Recently, she has been wearing support stockings which have been beneficial and she denies recent increasing leg swelling.  She is widowed since September 21, 2008 when her husband died suddenly in his sleep. Her son lives with her at her house. She tries to remain fairly active. She denies recent episodes of chest pressure or shortness of breath. She denies recent bleeding. She tells me and I nor was checked several weeks ago and this was 2.5 and her systolic blood pressure at that time was approximately 125.  Past Medical History  Diagnosis Date  . Hypothyroidism   . Atrial fibrillation   . Hyperlipidemia   . Constipation   . Nausea & vomiting   . GERD (gastroesophageal reflux disease)     Past Surgical History  Procedure Laterality Date  . Tonsillectomy    . Colonoscopy    . Upper gastrointestinal endoscopy    . Transthoracic echocardiogram  04/2012    EF=>55%, borderline conc LVH; LA mod dilated; RA mildly dilated; mild mitral annular calcif, mod MR; mild-mod TR with elevated RSVP 30-30mmHg, mild pulm HTN; mild calcif of AV leaflets, AV mildly sclerotic; aortic root sclerosis/calcif  . Nm myocar perf wall motion  04/2006    dipyridamole; normal pattern of perfusion to all regions, post-stress EF 73%    Allergies  Allergen Reactions  . Adhesive [Tape] Other (See Comments)    REACTION: pulls skin  . Codeine     REACTION:  UNKNOWN REACTION  . Prednisone Other (See Comments)    REACTION: UNKNOWN  . Sulfonamide Derivatives     REACTION: UNKNOWN REACTION  . Vicodin [Hydrocodone-Acetaminophen]     Current Outpatient Prescriptions  Medication Sig Dispense Refill  . ALPRAZolam (XANAX) 0.5 MG tablet Take 0.5 mg by mouth at bedtime as needed. Patient states that she takes 1 1/2 every night to help with sleep      . bismuth subsalicylate (PEPTO BISMOL) 262 MG/15ML suspension Take 15 mLs by mouth as needed. For upset stomach/ gas relief      . clobetasol cream (TEMOVATE) [id number] % Apply 1 application topically 2 (two) times daily.       . diclofenac sodium (VOLTAREN) 1 % GEL Apply 2 g topically 3 (three) times daily.      Marland Kitchen levothyroxine (SYNTHROID, LEVOTHROID) 75 MCG tablet Take 75 mcg by mouth daily before breakfast.      . Lido-Capsaicin-Men-Methyl Sal (MEDI-PATCH-LIDOCAINE) 0.5-0.035-5-20 % PTCH Apply topically.      . metoprolol (TOPROL-XL) 25 MG 24 hr tablet Take 12.5 mg by mouth 2 (two) times daily.       Marland Kitchen omeprazole (PRILOSEC) 20 MG capsule Take 20 mg by mouth every morning.       . sennosides-docusate sodium (SENOKOT-S) 8.6-50 MG tablet Take 1 tablet by mouth as needed.      . simvastatin (ZOCOR) 20 MG tablet Take 10 mg by mouth daily.       Marland Kitchen  timolol (BETIMOL) 0.5 % ophthalmic solution Place 1 drop into the right eye 2 (two) times daily.      Marland Kitchen warfarin (COUMADIN) 3 MG tablet Take 3 mg by mouth as directed. Patient states that she is taking 3 mg daily       No current facility-administered medications for this visit.    History   Social History  . Marital Status: Widowed    Spouse Name: N/A    Number of Children: 3  . Years of Education: N/A   Occupational History  .     Social History Main Topics  . Smoking status: Former Smoker    Quit date: 01/10/1985  . Smokeless tobacco: Never Used  . Alcohol Use: No  . Drug Use: No  . Sexual Activity: Not on file   Other Topics Concern  . Not on file     Social History Narrative  . No narrative on file   Social history is notable that she is widowed and has 3 children. Since her husband passed away her son was with her. There is remote tobacco history but she quit in 1986. She remains active.  Family History  Problem Relation Age of Onset  . Cancer Mother   . Ovarian cancer Mother   . Cancer Father   . Aortic aneurysm    . Cancer Brother   . Emphysema Brother   . Emphysema Brother   . Pneumonia Brother   . Aneurysm Brother   . Healthy Son   . Sleep apnea Son   . Kidney cancer Son     ROS is negative for fevers, chills or night sweats.  She does wear glasses. She denies change in vision or hearing. She is unaware of lymphadenopathy. She does have some mild arthritic complaints for which he takes Voltaren gel. She denies wheezing. There is no PND or orthopnea. She denies presyncope or syncope. She has permanent atrial fibrillation. She denies recent tachycardic spells. She denies abdominal pain, nausea vomiting or diarrhea. She denies awareness of blood in stool. There are no GU complaints. She denies claudication. She denies myalgias. She has history of mild leg swelling which have improved with support stockings below the knee. She does have hypothyroidism on Synthroid replacement. There is no diabetes. She denies awareness of sleep disordered breathing. Other comprehensive 14 point system review is negative.  PE BP 114/64  Pulse 62  Ht 5\' 4"  (1.626 m)  Wt 136 lb 9.6 oz (61.961 kg)  BMI 23.44 kg/m2  General: Alert, oriented, no distress.  Skin: normal turgor, no rashes HEENT: Normocephalic, atraumatic. Pupils round and reactive; sclera anicteric;no lid lag. Extraocular muscles intact. No xanthelasma Nose without nasal septal hypertrophy Mouth/Parynx benign; Mallinpatti scale 2 Neck: No JVD, no carotid bruits; normal carotid upstroke Lungs: clear to ausculatation and percussion; no wheezing or rales Chest wall: no tenderness to  palpitation Heart: Irregularly irregular rhythm with controlled ventricular response in the 60s;  s1 s2 normal ; faint 1/6 systolic murmur. No S3 gallop. No rub Abdomen: soft, nontender; no hepatosplenomehaly, BS+; abdominal aorta nontender and not dilated by palpation. Back: no CVA tenderness Pulses 2+ Extremities: no clubbing cyanosis or edema, Homan's sign negative  Neurologic: grossly nonfocal; cranial nerves grossly normal. Psychologic: normal affect and mood.  ECG (independently read by me): Atrial fibrillation with controlled ventricular response of 62 beats per minute. No significant ST changes.  LABS:  BMET    Component Value Date/Time   NA 136 07/19/2012 0459  K 3.5 07/19/2012 0459   CL 104 07/19/2012 0459   CO2 25 07/19/2012 0459   GLUCOSE 104* 07/19/2012 0459   BUN 22 07/19/2012 0459   CREATININE 1.04 07/19/2012 0459   CALCIUM 8.1* 07/19/2012 0459   GFRNONAA 47* 07/19/2012 0459   GFRAA 54* 07/19/2012 0459     Hepatic Function Panel     Component Value Date/Time   PROT 5.6* 07/19/2012 0459   ALBUMIN 2.6* 07/19/2012 0459   AST 62* 07/19/2012 0459   ALT 39* 07/19/2012 0459   ALKPHOS 93 07/19/2012 0459   BILITOT 1.0 07/19/2012 0459   BILIDIR <0.1 12/29/2011 2139   IBILI NOT CALCULATED 12/29/2011 2139     CBC    Component Value Date/Time   WBC 14.8* 07/19/2012 0459   RBC 3.81* 07/19/2012 0459   HGB 11.6* 07/19/2012 0459   HCT 33.9* 07/19/2012 0459   PLT 192 07/19/2012 0459   MCV 89.0 07/19/2012 0459   MCH 30.4 07/19/2012 0459   MCHC 34.2 07/19/2012 0459   RDW 13.5 07/19/2012 0459   LYMPHSABS 1.3 07/18/2012 1600   MONOABS 1.7* 07/18/2012 1600   EOSABS 0.2 07/18/2012 1600   BASOSABS 0.0 07/18/2012 1600     BNP No results found for this basename: probnp    Lipid Panel  No results found for this basename: chol, trig, hdl, cholhdl, vldl, ldlcalc     RADIOLOGY: No results found.    ASSESSMENT AND PLAN:  Leslie Rosario is an 80 year old female who is returning 41 next month. She appears younger  than her stated age. She has a history of permanent atrial fibrillation which is controlled with Toprol-XL 12.5 mg twice a day. She is on chronic Coumadin anticoagulation with warfarin 3 mg recent INR was therapeutic. She's not having bleeding issues. I did review her last echo in May 2013 which showed borderline concentric LVH with normal systolic function. She had moderate left atrial dilatation and mild RA dilatation. There was small plantar hypertension with mild-to-moderate TR and moderate MR. There also is aortic sclerosis without stenosis in addition to aortic root sclerosis. She does have a history of hypothyroidism currently now on Synthroid 75 mcg. Her peripheral edema has resolved and she is doing well with support stockings. Her blood pressure is stable.  She continues to remain active and walks daily. She did have laboratory done by her primary physician. I will obtain these results for my review. Adjustments will be made if necessary to her medical regimen. As long as she remains stable I will see her in one year for followup evaluation.     Troy Sine, MD, [facility]  01/16/2014 12:21 PM

## 2014-04-03 ENCOUNTER — Ambulatory Visit (INDEPENDENT_AMBULATORY_CARE_PROVIDER_SITE_OTHER): Payer: Medicare Other | Admitting: Internal Medicine

## 2014-04-03 ENCOUNTER — Encounter (INDEPENDENT_AMBULATORY_CARE_PROVIDER_SITE_OTHER): Payer: Self-pay | Admitting: Internal Medicine

## 2014-04-03 VITALS — BP 112/70 | HR 72 | Temp 97.0°F | Resp 18 | Ht 64.0 in | Wt 143.0 lb

## 2014-04-03 DIAGNOSIS — K219 Gastro-esophageal reflux disease without esophagitis: Secondary | ICD-10-CM

## 2014-04-03 DIAGNOSIS — K5900 Constipation, unspecified: Secondary | ICD-10-CM

## 2014-04-03 NOTE — Progress Notes (Signed)
Presenting complaint;  F/U for constipation and GERD.  Subjective:  Patient is 80 year old Caucasian female who has history of GERD and chronic constipation and presents for scheduled visit. She was last seen 6 months ago. She states she is just doing fine. She denies dysphagia or heartburn. She is afraid to stop omeprazole because in the past she is developed burping and regurgitation. She is not experiencing any side effects with this medication. She says her bowels are moving almost daily. She is taking OTC laxative and/or suppository occasionally. In addition to eating fruits and vegetables she eats prunes and apples every day. Her appetite is good and she has gained 6 pounds since her last visit. She denies melena or rectal bleeding.   Current Medications: Outpatient Encounter Prescriptions as of 04/03/2014  Medication Sig  . ALPRAZolam (XANAX) 0.5 MG tablet Take 0.5 mg by mouth at bedtime as needed. Patient states that she takes 1 1/2 every night to help with sleep  . alum & mag hydroxide-simeth (ANTACID ANTI-GAS MAX STRENGTH) 400-400-40 MG/5ML suspension Take 5 mLs by mouth as needed for indigestion.  . bisacodyl (DULCOLAX) 10 MG suppository Place 10 mg rectally every three (3) days as needed for moderate constipation.  . diclofenac sodium (VOLTAREN) 1 % GEL Apply 2 g topically 3 (three) times daily.  Marland Kitchen levothyroxine (SYNTHROID, LEVOTHROID) 75 MCG tablet Take 75 mcg by mouth daily before breakfast.  . Lido-Capsaicin-Men-Methyl Sal (MEDI-PATCH-LIDOCAINE) 0.5-0.035-5-20 % PTCH Apply topically.  . metoprolol (TOPROL-XL) 25 MG 24 hr tablet Take 12.5 mg by mouth 2 (two) times daily.   Marland Kitchen omeprazole (PRILOSEC) 20 MG capsule Take 20 mg by mouth every morning.   . sennosides-docusate sodium (SENOKOT-S) 8.6-50 MG tablet Take 1 tablet by mouth as needed.  . simvastatin (ZOCOR) 20 MG tablet Take 10 mg by mouth daily.   . timolol (BETIMOL) 0.5 % ophthalmic solution Place 1 drop into the right eye  daily.   Marland Kitchen warfarin (COUMADIN) 3 MG tablet Take 3 mg by mouth as directed. Patient states that she is taking 3 mg daily  . [DISCONTINUED] bismuth subsalicylate (PEPTO BISMOL) 262 MG/15ML suspension Take 15 mLs by mouth as needed. For upset stomach/ gas relief  . [DISCONTINUED] clobetasol cream (TEMOVATE) [id number] % Apply 1 application topically 2 (two) times daily.      Objective: Blood pressure 112/70, pulse 72, temperature 97 F (36.1 C), temperature source Oral, resp. rate 18, height 5\' 4"  (1.626 m), weight 143 lb (64.864 kg). Patient is alert and in no acute distress she appears much younger than stated age. Conjunctiva is pink. Sclera is nonicteric Oropharyngeal mucosa is normal. No neck masses or thyromegaly noted. Cardiac exam with regular rhythm normal S1 and S2. No murmur or gallop noted. Lungs are clear to auscultation. Abdomen is symmetrical soft and nontender without organomegaly or masses.  No LE edema or clubbing noted.   Assessment:  #1. Chronic constipation. She is doing well with diatary measures and prn use of laxatives without need to escalate laxative dose. #2. GERD complicated by esophageal stricture last dilated in oct, 2007. Will continue PPI indefinitely.    Plan: Patient will continue omeprazole 20 mg by mouth every morning. Continue high fiber diet and when necessary Senokot or Dulcolax suppository OV in one year.

## 2014-04-03 NOTE — Patient Instructions (Addendum)
Call if medications for heartburn and constipation stop working.

## 2014-05-02 ENCOUNTER — Other Ambulatory Visit: Payer: Self-pay | Admitting: Pharmacist Clinician (PhC)/ Clinical Pharmacy Specialist

## 2014-05-05 ENCOUNTER — Other Ambulatory Visit: Payer: Self-pay

## 2014-05-16 ENCOUNTER — Telehealth: Payer: Self-pay | Admitting: Cardiovascular Disease

## 2014-05-16 NOTE — Telephone Encounter (Signed)
States the refill for Leslie Rosario's Coumadin was denied---we did not prescribe this medication.  Dr. Claiborne Billings authorized the last refill on 02/07/14.  When calling back please reference # [phone] [id number].

## 2014-05-17 NOTE — Telephone Encounter (Signed)
Returned call to [employer]. Notified that Dr. Delphina Cahill monitors INR and refills should be deferred to his office. RN notified patient of this as well.

## 2014-06-13 ENCOUNTER — Other Ambulatory Visit: Payer: Self-pay

## 2014-09-26 ENCOUNTER — Emergency Department (HOSPITAL_COMMUNITY)
Admission: EM | Admit: 2014-09-26 | Discharge: 2014-09-27 | Disposition: A | Discharge status: Home / Self Care | Payer: Medicare Other | Attending: Emergency Medicine | Admitting: Emergency Medicine

## 2014-09-26 ENCOUNTER — Encounter (HOSPITAL_COMMUNITY): Payer: Self-pay | Admitting: Emergency Medicine

## 2014-09-26 DIAGNOSIS — Z792 Long term (current) use of antibiotics: Secondary | ICD-10-CM | POA: Insufficient documentation

## 2014-09-26 DIAGNOSIS — K5900 Constipation, unspecified: Secondary | ICD-10-CM | POA: Diagnosis not present

## 2014-09-26 DIAGNOSIS — Z87891 Personal history of nicotine dependence: Secondary | ICD-10-CM | POA: Diagnosis not present

## 2014-09-26 DIAGNOSIS — K219 Gastro-esophageal reflux disease without esophagitis: Secondary | ICD-10-CM | POA: Diagnosis not present

## 2014-09-26 DIAGNOSIS — N390 Urinary tract infection, site not specified: Secondary | ICD-10-CM

## 2014-09-26 DIAGNOSIS — Z9889 Other specified postprocedural states: Secondary | ICD-10-CM | POA: Insufficient documentation

## 2014-09-26 DIAGNOSIS — I4891 Unspecified atrial fibrillation: Secondary | ICD-10-CM | POA: Diagnosis not present

## 2014-09-26 DIAGNOSIS — E039 Hypothyroidism, unspecified: Secondary | ICD-10-CM | POA: Insufficient documentation

## 2014-09-26 DIAGNOSIS — E785 Hyperlipidemia, unspecified: Secondary | ICD-10-CM | POA: Diagnosis not present

## 2014-09-26 DIAGNOSIS — Z791 Long term (current) use of non-steroidal anti-inflammatories (NSAID): Secondary | ICD-10-CM | POA: Insufficient documentation

## 2014-09-26 DIAGNOSIS — I959 Hypotension, unspecified: Secondary | ICD-10-CM | POA: Diagnosis present

## 2014-09-26 DIAGNOSIS — Z7901 Long term (current) use of anticoagulants: Secondary | ICD-10-CM | POA: Diagnosis not present

## 2014-09-26 DIAGNOSIS — Z79899 Other long term (current) drug therapy: Secondary | ICD-10-CM | POA: Diagnosis not present

## 2014-09-26 LAB — URINALYSIS, ROUTINE W REFLEX MICROSCOPIC
Bilirubin Urine: NEGATIVE
Glucose, UA: NEGATIVE mg/dL
Nitrite: NEGATIVE
Protein, ur: NEGATIVE mg/dL
Specific Gravity, Urine: 1.025 (ref 1.005–1.030)
Urobilinogen, UA: 0.2 mg/dL (ref 0.0–1.0)
pH: 5 (ref 5.0–8.0)

## 2014-09-26 LAB — CBC WITH DIFFERENTIAL/PLATELET
Basophils Absolute: 0 10*3/uL (ref 0.0–0.1)
Basophils Relative: 0 % (ref 0–1)
Eosinophils Absolute: 0 10*3/uL (ref 0.0–0.7)
Eosinophils Relative: 0 % (ref 0–5)
HCT: 38.1 % (ref 36.0–46.0)
Hemoglobin: 13 g/dL (ref 12.0–15.0)
Lymphocytes Relative: 6 % — ABNORMAL LOW (ref 12–46)
Lymphs Abs: 1 10*3/uL (ref 0.7–4.0)
MCH: 31.8 pg (ref 26.0–34.0)
MCHC: 34.1 g/dL (ref 30.0–36.0)
MCV: 93.2 fL (ref 78.0–100.0)
Monocytes Absolute: 1.2 10*3/uL — ABNORMAL HIGH (ref 0.1–1.0)
Monocytes Relative: 8 % (ref 3–12)
Neutro Abs: 13.4 10*3/uL — ABNORMAL HIGH (ref 1.7–7.7)
Neutrophils Relative %: 86 % — ABNORMAL HIGH (ref 43–77)
Platelets: 225 10*3/uL (ref 150–400)
RBC: 4.09 MIL/uL (ref 3.87–5.11)
RDW: 13.7 % (ref 11.5–15.5)
WBC: 15.7 10*3/uL — ABNORMAL HIGH (ref 4.0–10.5)

## 2014-09-26 LAB — URINE MICROSCOPIC-ADD ON

## 2014-09-26 LAB — BASIC METABOLIC PANEL
Anion gap: 15 (ref 5–15)
BUN: 25 mg/dL — ABNORMAL HIGH (ref 6–23)
CO2: 23 mEq/L (ref 19–32)
Calcium: 8.6 mg/dL (ref 8.4–10.5)
Chloride: 100 mEq/L (ref 96–112)
Creatinine, Ser: 1.47 mg/dL — ABNORMAL HIGH (ref 0.50–1.10)
GFR calc Af Amer: 35 mL/min — ABNORMAL LOW (ref >90)
GFR calc non Af Amer: 30 mL/min — ABNORMAL LOW (ref >90)
Glucose, Bld: 144 mg/dL — ABNORMAL HIGH (ref 70–99)
Potassium: 4.2 mEq/L (ref 3.7–5.3)
Sodium: 138 mEq/L (ref 137–147)

## 2014-09-26 NOTE — ED Notes (Addendum)
Pt states that she felt very tired/weak today - she was unable to get up out of bed. Pt reports that her BP was low - BP currently 109/62. Pt reports being on cipro for UTI, states that doctor switched her to nitrofurantoin (which she states she took yesterday but not today).

## 2014-09-26 NOTE — Discharge Instructions (Signed)
You must take your antibiotic twice a day. Increase fluids. Eat regular meals. Followup your primary care Dr.

## 2014-09-26 NOTE — ED Provider Notes (Signed)
CSN: [id number]     Arrival date & time 09/26/14  1946 History  This chart was scribed for Leslie Christen, MD by Lowella Petties, ED Scribe. The patient was seen in room APA10/APA10. Patient's care was started at 9:25 PM.   Chief Complaint  Patient presents with  . Hypotension   The history is provided by the patient. No language interpreter was used.   HPI Comments: Leslie Rosario is a 80 y.o. female who presents to the Emergency Department complaining of hypotension which began earlier today. She reports associated lightheadedness and generalized weakness and difficulty getting out of bed which began last night. Pt states that she was able to walk today, but slower than usual. Pt reports eating less than usual today. She has not been drinking fluids. She reports pain before and after urination. Pt began taking a new ABX yesterday for a recently diagnosed UTI. She denies chset pain or SOB. No fever, chills, neurological deficits.  Past Medical History  Diagnosis Date  . Hypothyroidism   . Atrial fibrillation   . Hyperlipidemia   . Constipation   . Nausea & vomiting   . GERD (gastroesophageal reflux disease)    Past Surgical History  Procedure Laterality Date  . Tonsillectomy    . Colonoscopy    . Upper gastrointestinal endoscopy    . Transthoracic echocardiogram  04/2012    EF=>55%, borderline conc LVH; LA mod dilated; RA mildly dilated; mild mitral annular calcif, mod MR; mild-mod TR with elevated RSVP 30-34mmHg, mild pulm HTN; mild calcif of AV leaflets, AV mildly sclerotic; aortic root sclerosis/calcif  . Nm myocar perf wall motion  04/2006    dipyridamole; normal pattern of perfusion to all regions, post-stress EF 73%   Family History  Problem Relation Age of Onset  . Cancer Mother   . Ovarian cancer Mother   . Cancer Father   . Aortic aneurysm    . Cancer Brother   . Emphysema Brother   . Emphysema Brother   . Pneumonia Brother   . Aneurysm Brother   . Healthy Son   . Sleep  apnea Son   . Kidney cancer Son    History  Substance Use Topics  . Smoking status: Former Smoker    Quit date: 01/10/1985  . Smokeless tobacco: Never Used  . Alcohol Use: No   OB History   Grav Para Term Preterm Abortions TAB SAB Ect Mult Living                 Review of Systems A complete 10 system review of systems was obtained and all systems are negative except as noted in the HPI and PMH.   Allergies  Adhesive; Codeine; Prednisone; Sulfonamide derivatives; and Vicodin  Home Medications   Prior to Admission medications   Medication Sig Start Date End Date Taking? Authorizing Provider  ALPRAZolam Duanne Moron) 0.5 MG tablet Take 0.5 mg by mouth at bedtime as needed. Patient states that she takes 1 1/2 every night to help with sleep    Historical Provider, MD  alum & mag hydroxide-simeth (ANTACID ANTI-GAS MAX STRENGTH) 400-400-40 MG/5ML suspension Take 5 mLs by mouth as needed for indigestion.    Historical Provider, MD  bisacodyl (DULCOLAX) 10 MG suppository Place 10 mg rectally every three (3) days as needed for moderate constipation.    Historical Provider, MD  ciprofloxacin (CIPRO) 250 MG tablet Take 250 mg by mouth 2 (two) times daily. 5 day course starting on 09/20/2014 09/20/14  Historical Provider, MD  diclofenac sodium (VOLTAREN) 1 % GEL Apply 2 g topically 3 (three) times daily.    Historical Provider, MD  levothyroxine (SYNTHROID, LEVOTHROID) 75 MCG tablet Take 75 mcg by mouth daily before breakfast.    Historical Provider, MD  Lido-Capsaicin-Men-Methyl Sal (MEDI-PATCH-LIDOCAINE) 0.5-0.035-5-20 % PTCH Apply topically.    Historical Provider, MD  metoprolol (TOPROL-XL) 25 MG 24 hr tablet Take 12.5 mg by mouth 2 (two) times daily.     Historical Provider, MD  nitrofurantoin, macrocrystal-monohydrate, (MACROBID) 100 MG capsule Take 100 mg by mouth 2 (two) times daily. 7 day course starting on 09/25/2014 09/25/14   Historical Provider, MD  omeprazole (PRILOSEC) 20 MG capsule Take  20 mg by mouth every morning.     Historical Provider, MD  sennosides-docusate sodium (SENOKOT-S) 8.6-50 MG tablet Take 1 tablet by mouth as needed. 10/03/13   Rogene Houston, MD  simvastatin (ZOCOR) 20 MG tablet Take 10 mg by mouth daily.     Historical Provider, MD  timolol (BETIMOL) 0.5 % ophthalmic solution Place 1 drop into the right eye daily.     Historical Provider, MD  warfarin (COUMADIN) 3 MG tablet Take 3 mg by mouth as directed. Patient states that she is taking 3 mg daily    Historical Provider, MD   Triage Vitals: BP 109/62  Pulse 62  Temp(Src) 97.7 F (36.5 C) (Oral)  Resp 16  Ht 5\' 4"  (1.626 m)  Wt 143 lb (64.864 kg)  BMI 24.53 kg/m2  SpO2 99% Physical Exam  Nursing note and vitals reviewed. Constitutional: She is oriented to person, place, and time. She appears well-developed and well-nourished.  HENT:  Head: Normocephalic and atraumatic.  Eyes: Conjunctivae and EOM are normal. Pupils are equal, round, and reactive to light.  Neck: Normal range of motion. Neck supple.  Cardiovascular: Normal rate, regular rhythm and normal heart sounds.   Pulmonary/Chest: Effort normal and breath sounds normal.  Abdominal: Soft. Bowel sounds are normal.  Musculoskeletal: Normal range of motion.  Mid lower back tenderness.  Neurological: She is alert and oriented to person, place, and time.  Skin: Skin is warm and dry.  Psychiatric: She has a normal mood and affect. Her behavior is normal.    ED Course  Procedures (including critical care time) DIAGNOSTIC STUDIES: Oxygen Saturation is 99% on room air, normal by my interpretation.    COORDINATION OF CARE: 9:30 PM-Discussed treatment plan which includes lab work with pt at bedside and pt agreed to plan.   Results for orders placed during the hospital encounter of [id number]  BASIC METABOLIC PANEL      Result Value Ref Range   Sodium 138  137 - 147 mEq/L   Potassium 4.2  3.7 - 5.3 mEq/L   Chloride 100  96 - 112 mEq/L   CO2 23   19 - 32 mEq/L   Glucose, Bld 144 (*) 70 - 99 mg/dL   BUN 25 (*) 6 - 23 mg/dL   Creatinine, Ser 1.47 (*) 0.50 - 1.10 mg/dL   Calcium 8.6  8.4 - 10.5 mg/dL   GFR calc non Af Amer 30 (*) >90 mL/min   GFR calc Af Amer 35 (*) >90 mL/min   Anion gap 15  5 - 15  CBC WITH DIFFERENTIAL      Result Value Ref Range   WBC 15.7 (*) 4.0 - 10.5 K/uL   RBC 4.09  3.87 - 5.11 MIL/uL   Hemoglobin 13.0  12.0 - 15.0 g/dL  HCT 38.1  36.0 - 46.0 %   MCV 93.2  78.0 - 100.0 fL   MCH 31.8  26.0 - 34.0 pg   MCHC 34.1  30.0 - 36.0 g/dL   RDW 13.7  11.5 - 15.5 %   Platelets 225  150 - 400 K/uL   Neutrophils Relative % 86 (*) 43 - 77 %   Neutro Abs 13.4 (*) 1.7 - 7.7 K/uL   Lymphocytes Relative 6 (*) 12 - 46 %   Lymphs Abs 1.0  0.7 - 4.0 K/uL   Monocytes Relative 8  3 - 12 %   Monocytes Absolute 1.2 (*) 0.1 - 1.0 K/uL   Eosinophils Relative 0  0 - 5 %   Eosinophils Absolute 0.0  0.0 - 0.7 K/uL   Basophils Relative 0  0 - 1 %   Basophils Absolute 0.0  0.0 - 0.1 K/uL  URINALYSIS, ROUTINE W REFLEX MICROSCOPIC      Result Value Ref Range   Color, Urine AMBER (*) YELLOW   APPearance CLEAR  CLEAR   Specific Gravity, Urine 1.025  1.005 - 1.030   pH 5.0  5.0 - 8.0   Glucose, UA NEGATIVE  NEGATIVE mg/dL   Hgb urine dipstick TRACE (*) NEGATIVE   Bilirubin Urine NEGATIVE  NEGATIVE   Ketones, ur TRACE (*) NEGATIVE mg/dL   Protein, ur NEGATIVE  NEGATIVE mg/dL   Urobilinogen, UA 0.2  0.0 - 1.0 mg/dL   Nitrite NEGATIVE  NEGATIVE   Leukocytes, UA TRACE (*) NEGATIVE  URINE MICROSCOPIC-ADD ON      Result Value Ref Range   Squamous Epithelial / LPF MANY (*) RARE   WBC, UA 7-10  <3 WBC/hpf   RBC / HPF 7-10  <3 RBC/hpf   Bacteria, UA MANY (*) RARE   Casts HYALINE CASTS (*) NEGATIVE   Urine-Other MUCOUS PRESENT      Imaging Review No results found.   EKG Interpretation   Date/Time:  Tuesday September 26 2014 22:30:47 EDT Ventricular Rate:  68 PR Interval:    QRS Duration: 86 QT Interval:  451 QTC  Calculation: 480 R Axis:   15 Text Interpretation:  Atrial fibrillation Low voltage, precordial leads  Borderline repolarization abnormality Confirmed by Arien Benincasa  MD, Janit Cutter ([id number])  on 09/26/2014 10:43:43 PM      MDM   Final diagnoses:  UTI (lower urinary tract infection)   Patient is nontoxic-appearing. She apparently has missed her last 2 doses of Macrobid. Color is good she is alert and oriented. Discussed test results with patient and her son. Encouraged to drink fluids, eat regular meals, continue antibiotic  I personally performed the services described in this documentation, which was scribed in my presence. The recorded information has been reviewed and is accurate.    Leslie Christen, MD 09/26/14 [phone]

## 2014-09-27 NOTE — ED Notes (Signed)
Patient verbalizes understanding of discharge instructions and follow up care, and home care. Patient out of department at this time, escorted by family.

## 2014-09-29 LAB — URINE CULTURE: Colony Count: 10000

## 2014-10-02 ENCOUNTER — Telehealth (HOSPITAL_BASED_OUTPATIENT_CLINIC_OR_DEPARTMENT_OTHER): Payer: Self-pay

## 2014-10-02 NOTE — Telephone Encounter (Signed)
Post ED Visit - Positive Culture Follow-up  Culture report reviewed by antimicrobial stewardship pharmacist: []  Wes DeForest, Pharm.D., BCPS [x]  Heide Guile, Pharm.D., BCPS []  Alycia Rossetti, Pharm.D., BCPS []  [place], Pharm.D., BCPS, AAHIVP []  Legrand Como, Pharm.D., BCPS, AAHIVP []  Carly Sabat, Pharm.D. []  Elenor Quinones, Pharm.D.  Positive Urine culture Treated with Ciprofloxacin, organism sensitive to the same and no further patient follow-up is required at this time.  Dortha Kern 10/02/2014, 4:06 AM

## 2014-10-13 ENCOUNTER — Ambulatory Visit (INDEPENDENT_AMBULATORY_CARE_PROVIDER_SITE_OTHER): Payer: Medicare Other | Admitting: Urology

## 2014-10-13 DIAGNOSIS — N3000 Acute cystitis without hematuria: Secondary | ICD-10-CM

## 2014-12-12 ENCOUNTER — Encounter (INDEPENDENT_AMBULATORY_CARE_PROVIDER_SITE_OTHER): Payer: Self-pay | Admitting: *Deleted

## 2015-01-12 ENCOUNTER — Ambulatory Visit (INDEPENDENT_AMBULATORY_CARE_PROVIDER_SITE_OTHER): Payer: Medicare Other | Admitting: Urology

## 2015-01-12 DIAGNOSIS — N3000 Acute cystitis without hematuria: Secondary | ICD-10-CM

## 2015-01-12 DIAGNOSIS — N3941 Urge incontinence: Secondary | ICD-10-CM

## 2015-01-12 DIAGNOSIS — N952 Postmenopausal atrophic vaginitis: Secondary | ICD-10-CM

## 2015-01-17 ENCOUNTER — Ambulatory Visit: Payer: Medicare Other | Admitting: Cardiovascular Disease

## 2015-03-29 ENCOUNTER — Encounter: Payer: Self-pay | Admitting: Cardiovascular Disease

## 2015-03-29 ENCOUNTER — Ambulatory Visit (INDEPENDENT_AMBULATORY_CARE_PROVIDER_SITE_OTHER): Payer: Medicare Other | Admitting: Cardiovascular Disease

## 2015-03-29 VITALS — BP 140/70 | HR 53 | Ht 64.0 in | Wt 142.3 lb

## 2015-03-29 DIAGNOSIS — I480 Paroxysmal atrial fibrillation: Secondary | ICD-10-CM

## 2015-03-29 DIAGNOSIS — I272 Pulmonary hypertension, unspecified: Secondary | ICD-10-CM

## 2015-03-29 DIAGNOSIS — Z7901 Long term (current) use of anticoagulants: Secondary | ICD-10-CM

## 2015-03-29 DIAGNOSIS — E7800 Pure hypercholesterolemia, unspecified: Secondary | ICD-10-CM

## 2015-03-29 DIAGNOSIS — I270 Primary pulmonary hypertension: Secondary | ICD-10-CM

## 2015-03-29 DIAGNOSIS — E039 Hypothyroidism, unspecified: Secondary | ICD-10-CM

## 2015-03-29 NOTE — Patient Instructions (Signed)
Your physician has recommended you make the following change in your medication: decrease the metoprolol to 12.5 mg once a day.   Your physician wants you to follow-up in: 1 year or sooner if needed. You will receive a reminder letter in the mail two months in advance. If you don't receive a letter, please call our office to schedule the follow-up appointment.

## 2015-03-30 ENCOUNTER — Encounter: Payer: Self-pay | Admitting: Cardiovascular Disease

## 2015-03-30 DIAGNOSIS — E039 Hypothyroidism, unspecified: Secondary | ICD-10-CM | POA: Insufficient documentation

## 2015-03-30 NOTE — Progress Notes (Signed)
Patient ID: CAITLINN KLINKER, female   DOB: Sep 13, 1923, 81 y.o.   MRN: [id number]     HPI: KETURA SIREK is a 81 y.o. female who presents for one-year cardiology evaluation.  Ms. Geoffroy has a history of permanent atrial fibrillation and is on Coumadin anticoagulation. She has a history of hypothyroidism on thyroid replacement with Synthroid 75 mcg.There is also a history of hyperlipidemia and is on simvastatin 10 mg. There's also a history of hypertension as well as mild leg swelling.  In the past.  She had issues with leg edema but this has significant improved with support stockings.  She will be seeing her primary physician next week and she tells me laboratory will be obtained at that time.  She denies any episodes of chest pain.  She is unaware of her pulse being irregular.  She denies dizziness, presyncope or palpitations.  She presents for a 14 month evaluation.  Past Medical History  Diagnosis Date  . Hypothyroidism   . Atrial fibrillation   . Hyperlipidemia   . Constipation   . Nausea & vomiting   . GERD (gastroesophageal reflux disease)     Past Surgical History  Procedure Laterality Date  . Tonsillectomy    . Colonoscopy    . Upper gastrointestinal endoscopy    . Transthoracic echocardiogram  04/2012    EF=>55%, borderline conc LVH; LA mod dilated; RA mildly dilated; mild mitral annular calcif, mod MR; mild-mod TR with elevated RSVP 30-52mHg, mild pulm HTN; mild calcif of AV leaflets, AV mildly sclerotic; aortic root sclerosis/calcif  . Nm myocar perf wall motion  04/2006    dipyridamole; normal pattern of perfusion to all regions, post-stress EF 73%    Allergies  Allergen Reactions  . Adhesive [Tape] Other (See Comments)    REACTION: pulls skin  . Codeine     REACTION: UNKNOWN REACTION  . Prednisone Other (See Comments)    REACTION: UNKNOWN  . Sulfonamide Derivatives     REACTION: UNKNOWN REACTION  . Vicodin [Hydrocodone-Acetaminophen]     Current Outpatient  Prescriptions  Medication Sig Dispense Refill  . ALPRAZolam (XANAX) 0.5 MG tablet Take 0.5 mg by mouth at bedtime as needed. Patient states that she takes 1 1/2 every night to help with sleep    . alum & mag hydroxide-simeth (ANTACID ANTI-GAS MAX STRENGTH) 400-400-40 MG/5ML suspension Take 5 mLs by mouth as needed for indigestion.    . bisacodyl (DULCOLAX) 10 MG suppository Place 10 mg rectally every three (3) days as needed for moderate constipation.    .Marland Kitchenlevothyroxine (SYNTHROID, LEVOTHROID) 75 MCG tablet Take 75 mcg by mouth daily before breakfast.    . metoprolol (TOPROL-XL) 25 MG 24 hr tablet Take 12.5 mg by mouth daily.     .Marland Kitchenomeprazole (PRILOSEC) 20 MG capsule Take 20 mg by mouth every morning.     . sennosides-docusate sodium (SENOKOT-S) 8.6-50 MG tablet Take 1 tablet by mouth as needed.    . simvastatin (ZOCOR) 20 MG tablet Take 10 mg by mouth daily.     . timolol (BETIMOL) 0.5 % ophthalmic solution Place 1 drop into the right eye daily.     .Marland Kitchenwarfarin (COUMADIN) 3 MG tablet Take 3 mg by mouth as directed. Patient states that she is taking 3 mg daily     No current facility-administered medications for this visit.    History   Social History  . Marital Status: Widowed    Spouse Name: N/A  . Number of Children:  3  . Years of Education: N/A   Occupational History  .     Social History Main Topics  . Smoking status: Former Smoker    Quit date: 01/10/1985  . Smokeless tobacco: Never Used  . Alcohol Use: No  . Drug Use: No  . Sexual Activity: Not on file   Other Topics Concern  . Not on file   Social History Narrative   Social history is notable that she is widowed and has 3 children. Since her husband passed away her son was with her. There is remote tobacco history but she quit in 1986. She remains active.  Family History  Problem Relation Age of Onset  . Cancer Mother   . Ovarian cancer Mother   . Cancer Father   . Aortic aneurysm    . Cancer Brother   .  Emphysema Brother   . Emphysema Brother   . Pneumonia Brother   . Aneurysm Brother   . Healthy Son   . Sleep apnea Son   . Kidney cancer Son     ROS General: Negative; No fevers, chills, or night sweats;  HEENT: Negative; No changes in vision or hearing, sinus congestion, difficulty swallowing Pulmonary: Negative; No cough, wheezing, shortness of breath, hemoptysis Cardiovascular: Negative; No chest pain, presyncope, syncope, palpitations Resolution of prior edema GI: Negative; No nausea, vomiting, diarrhea, or abdominal pain GU: Negative; No dysuria, hematuria, or difficulty voiding Musculoskeletal: Negative; no myalgias, joint pain, or weakness Hematologic/Oncology: Negative; no easy bruising, bleeding; on Coumadin Endocrine: Positive for hypothyroidism Neuro: Negative; no changes in balance, headaches Skin: Negative; No rashes or skin lesions Psychiatric: Negative; No behavioral problems, depression Sleep: Negative; No snoring, daytime sleepiness, hypersomnolence, bruxism, restless legs, hypnogognic hallucinations, no cataplexy Other comprehensive 14 point system review is negative.   PE BP 140/70 mmHg  Pulse 53  Ht '5\' 4"'  (1.626 m)  Wt 142 lb 4.8 oz (64.547 kg)  BMI 24.41 kg/m2  General: Alert, oriented, no distress.  Skin: normal turgor, no rashes HEENT: Normocephalic, atraumatic. Pupils round and reactive; sclera anicteric;no lid lag. Extraocular muscles intact. No xanthelasma Nose without nasal septal hypertrophy Mouth/Parynx benign; Mallinpatti scale 2 Neck: No JVD, no carotid bruits; normal carotid upstroke Lungs: clear to ausculatation and percussion; no wheezing or rales Chest wall: no tenderness to palpitation Heart: Irregularly irregular rhythm with controlled ventricular response in the 50s;  s1 s2 normal ; faint 1/6 systolic murmur. No S3 gallop. No rub, thrill or heaves Abdomen: soft, nontender; no hepatosplenomehaly, BS+; abdominal aorta nontender and not  dilated by palpation. Back: no CVA tenderness Pulses 2+ Extremities: She is wearing support stockings; no clubbing cyanosis or edema, Homan's sign negative  Neurologic: grossly nonfocal; cranial nerves grossly normal. Psychologic: normal affect and mood.  ECG (independently read by me): Atrial fibrillation with a rate at 53.  No significant ST-T change.  February 2015 ECG (independently read by me): Atrial fibrillation with controlled ventricular response of 62 beats per minute. No significant ST changes.  LABS:  BMET  BMP Latest Ref Rng 09/26/2014 07/19/2012 07/18/2012  Glucose 70 - 99 mg/dL 144(H) 104(H) 150(H)  BUN 6 - 23 mg/dL 25(H) 22 23  Creatinine 0.50 - 1.10 mg/dL 1.47(H) 1.04 1.28(H)  Sodium 137 - 147 mEq/L 138 136 131(L)  Potassium 3.7 - 5.3 mEq/L 4.2 3.5 3.8  Chloride 96 - 112 mEq/L 100 104 95(L)  CO2 19 - 32 mEq/L '23 25 25  ' Calcium 8.4 - 10.5 mg/dL 8.6 8.1(L) 9.2  Hepatic Function Panel   Hepatic Function Latest Ref Rng 07/19/2012 12/29/2011 12/29/2011  Total Protein 6.0 - 8.3 g/dL 5.6(L) 7.6 7.8  Albumin 3.5 - 5.2 g/dL 2.6(L) 4.0 4.0  AST 0 - 37 U/L 62(H) 24 23  ALT 0 - 35 U/L 39(H) 18 18  Alk Phosphatase 39 - 117 U/L 93 89 92  Total Bilirubin 0.3 - 1.2 mg/dL 1.0 0.4 0.5  Bilirubin, Direct 0.0 - 0.3 mg/dL - <0.1 -     CBC  CBC Latest Ref Rng 09/26/2014 07/19/2012 07/18/2012  WBC 4.0 - 10.5 K/uL 15.7(H) 14.8(H) 23.8(H)  Hemoglobin 12.0 - 15.0 g/dL 13.0 11.6(L) 12.6  Hematocrit 36.0 - 46.0 % 38.1 33.9(L) 37.1  Platelets 150 - 400 K/uL 225 192 201   Lab Results  Component Value Date   TSH 2.134 12/24/2010    BNP No results found for: PROBNP  Lipid Panel  No results found for: CHOL   RADIOLOGY: No results found.    ASSESSMENT AND PLAN: Ms. Sydney Hasten is a 81 year old female who appears younger than her stated age. She has a history of permanent atrial fibrillation for which she has been on Toprol-XL 12.5 mg twice a day.  Her resting pulse is now at 53.   Her blood pressure on repeat by me was 124/74.  I am recommending that she reduce this to 12.5 mg daily, which should still provide adequate blood pressure support and allow for slight additional rate increase.  She is on Coumadin anticoagulation and denies bleeding.  Her last echo in May 2013  showed borderline concentric LVH with normal systolic function. She had moderate left atrial dilatation and mild RA dilatation. There was mild pulmonary hypertension with mild-to-moderate TR and moderate MR. There also is aortic sclerosis without stenosis in addition to aortic root sclerosis. She has ahistory of hypothyroidism currently now on Synthroid 75 mcg. Her peripheral edema has resolved and she continues to use her support stockings.  She continues to be on simvastatin at 20 mg for hyperlipidemia.  She will be obtaining laboratory neck suite by her primary physician and I will ask that these be sent to me for my review.   I will see her in one year for reevaluation or sooner if problems arise.  Time spent: 25 minutes   Troy Sine, MD, [facility]  03/30/2015 7:34 AM

## 2015-04-05 ENCOUNTER — Encounter (INDEPENDENT_AMBULATORY_CARE_PROVIDER_SITE_OTHER): Payer: Self-pay | Admitting: Internal Medicine

## 2015-04-05 ENCOUNTER — Ambulatory Visit (INDEPENDENT_AMBULATORY_CARE_PROVIDER_SITE_OTHER): Payer: Medicare Other | Admitting: Internal Medicine

## 2015-04-05 VITALS — BP 122/82 | HR 56 | Temp 97.4°F | Ht 64.0 in | Wt 142.0 lb

## 2015-04-05 DIAGNOSIS — K5909 Other constipation: Secondary | ICD-10-CM

## 2015-04-05 DIAGNOSIS — K219 Gastro-esophageal reflux disease without esophagitis: Secondary | ICD-10-CM | POA: Diagnosis not present

## 2015-04-05 NOTE — Patient Instructions (Addendum)
Continue the Omeprazole. If you run a fever and have abdominal pain, go to the ED or follow up with our office.  OV in 1 yr.

## 2015-04-05 NOTE — Progress Notes (Signed)
Subjective:    Patient ID: Leslie Rosario, female    DOB: 1923/03/29, 81 y.o.   MRN: [id number]  HPI Here today for f/u. She has chronic constipation. She usually has a BM every two days and sometimes it will be 3 days. She uses suppositories and stool softeners as needed. She has had two BMs today. She does eat prunes when she can remember to do so,.  No melena or BRRB.  She says her left lower  Abdomen has slight tenderness on and off. No fever associated with the tenderness.   Appetite is good. She has not lost any weight. She walks around her house if the weather is night. Her acid reflux is controlled with the Omeprazole. Hx of atrial fib and maintained on Coumadin.  Her last colonoscopy was in 2007 which revealed small external hemorrhoids. Left colonic diverticulosis. Most of the diverticula in the sigmoid colon. Biopsy: Adenomatous polyp.    Review of Systems Past Medical History  Diagnosis Date  . Hypothyroidism   . Atrial fibrillation   . Hyperlipidemia   . Constipation   . Nausea & vomiting   . GERD (gastroesophageal reflux disease)     Past Surgical History  Procedure Laterality Date  . Tonsillectomy    . Colonoscopy    . Upper gastrointestinal endoscopy    . Transthoracic echocardiogram  04/2012    EF=>55%, borderline conc LVH; LA mod dilated; RA mildly dilated; mild mitral annular calcif, mod MR; mild-mod TR with elevated RSVP 30-82mmHg, mild pulm HTN; mild calcif of AV leaflets, AV mildly sclerotic; aortic root sclerosis/calcif  . Nm myocar perf wall motion  04/2006    dipyridamole; normal pattern of perfusion to all regions, post-stress EF 73%    Allergies  Allergen Reactions  . Adhesive [Tape] Other (See Comments)    REACTION: pulls skin  . Codeine     REACTION: UNKNOWN REACTION  . Prednisone Other (See Comments)    REACTION: UNKNOWN  . Sulfonamide Derivatives     REACTION: UNKNOWN REACTION  . Vicodin [Hydrocodone-Acetaminophen]     Current Outpatient  Prescriptions on File Prior to Visit  Medication Sig Dispense Refill  . ALPRAZolam (XANAX) 0.5 MG tablet Take 0.5 mg by mouth at bedtime as needed. Patient states that she takes 1 1/2 every night to help with sleep    . alum & mag hydroxide-simeth (ANTACID ANTI-GAS MAX STRENGTH) 400-400-40 MG/5ML suspension Take 5 mLs by mouth as needed for indigestion.    . bisacodyl (DULCOLAX) 10 MG suppository Place 10 mg rectally every three (3) days as needed for moderate constipation.    Marland Kitchen levothyroxine (SYNTHROID, LEVOTHROID) 75 MCG tablet Take 75 mcg by mouth daily before breakfast.    . metoprolol (TOPROL-XL) 25 MG 24 hr tablet Take 12.5 mg by mouth daily.     Marland Kitchen omeprazole (PRILOSEC) 20 MG capsule Take 20 mg by mouth every morning.     . simvastatin (ZOCOR) 20 MG tablet Take 10 mg by mouth daily.     . timolol (BETIMOL) 0.5 % ophthalmic solution Place 1 drop into the right eye daily.     Marland Kitchen warfarin (COUMADIN) 3 MG tablet Take 3 mg by mouth as directed. Patient states that she is taking 3 mg daily     No current facility-administered medications on file prior to visit.   Widowed. Three boys in good health.     Objective:   Physical Exam Blood pressure 122/82, pulse 56, temperature 97.4 F (36.3 C), height  5\' 4"  (1.626 m), weight 142 lb (64.411 kg).  Alert and oriented. Skin warm and dry. Oral mucosa is moist.   . Sclera anicteric, conjunctivae is pink. Thyroid not enlarged. No cervical lymphadenopathy. Lungs clear. Heart regular rate and rhythm.  Abdomen is soft. Bowel sounds are positive. No hepatomegaly. No abdominal masses felt. No tenderness.  No edema to lower extremities.  Stool brown and guaiac negative.    Developer: [id number], Exp 9-17 Card: Lot 02-14, Exp 07.18    Assessment & Plan:  Chronic constipation. She eats prunes and uses stool softeners on a weekly basis. GERD controlled at this with Omeprazole. OV 1 yr.

## 2015-07-13 ENCOUNTER — Ambulatory Visit (INDEPENDENT_AMBULATORY_CARE_PROVIDER_SITE_OTHER): Payer: Medicare Other | Admitting: Urology

## 2015-07-13 DIAGNOSIS — N952 Postmenopausal atrophic vaginitis: Secondary | ICD-10-CM | POA: Diagnosis not present

## 2015-07-13 DIAGNOSIS — N3941 Urge incontinence: Secondary | ICD-10-CM

## 2015-12-31 ENCOUNTER — Encounter (INDEPENDENT_AMBULATORY_CARE_PROVIDER_SITE_OTHER): Payer: Self-pay | Admitting: Internal Medicine

## 2016-04-09 ENCOUNTER — Ambulatory Visit (INDEPENDENT_AMBULATORY_CARE_PROVIDER_SITE_OTHER): Payer: Medicare Other | Admitting: Internal Medicine

## 2016-04-09 ENCOUNTER — Encounter (INDEPENDENT_AMBULATORY_CARE_PROVIDER_SITE_OTHER): Payer: Self-pay | Admitting: Internal Medicine

## 2016-04-09 VITALS — BP 100/80 | HR 64 | Temp 97.9°F | Ht 64.0 in | Wt 147.5 lb

## 2016-04-09 DIAGNOSIS — K5909 Other constipation: Secondary | ICD-10-CM | POA: Diagnosis not present

## 2016-04-09 DIAGNOSIS — K219 Gastro-esophageal reflux disease without esophagitis: Secondary | ICD-10-CM

## 2016-04-09 NOTE — Patient Instructions (Signed)
OV in 1 year. Continue the Omeprazole.

## 2016-04-09 NOTE — Progress Notes (Signed)
Subjective:    Patient ID: Leslie Rosario, female    DOB: [birth year], 82 y.o.   MRN: [id number]  HPI HPI Here today for f/u. She has chronic constipation. She usually has a BM daily. She usually eats prunes to have a BM or when she thinks of it. She occasionally has to strain to have a BM. No melena or BRRB.  Her appetite is good. She has gained 5 pounds since her last visit in 2016.   Her acid reflux is controlled with the Omeprazole. She does occasionally have acid reflux. Hx of atrial fib and maintained on Coumadin.  Her last colonoscopy was in 2007 which revealed small external hemorrhoids. Left colonic diverticulosis. Most of the diverticula in the sigmoid colon. Biopsy: Adenomatous polyp. Recently saw Dr. [place] Crane for her scalp itching and was given an Rx for Cobetasol 0.05% BID   Review of Systems       Past Medical History  Diagnosis Date  . Hypothyroidism   . Atrial fibrillation ([place])   . Hyperlipidemia   . Constipation   . Nausea & vomiting   . GERD (gastroesophageal reflux disease)     Past Surgical History  Procedure Laterality Date  . Tonsillectomy    . Colonoscopy    . Upper gastrointestinal endoscopy    . Transthoracic echocardiogram  04/2012    EF=>55%, borderline conc LVH; LA mod dilated; RA mildly dilated; mild mitral annular calcif, mod MR; mild-mod TR with elevated RSVP 30-90mmHg, mild pulm HTN; mild calcif of AV leaflets, AV mildly sclerotic; aortic root sclerosis/calcif  . Nm myocar perf wall motion  04/2006    dipyridamole; normal pattern of perfusion to all regions, post-stress EF 73%    Allergies  Allergen Reactions  . Adhesive [Tape] Other (See Comments)    REACTION: pulls skin  . Codeine     REACTION: UNKNOWN REACTION  . Prednisone Other (See Comments)    REACTION: UNKNOWN  . Sulfonamide Derivatives     REACTION: UNKNOWN REACTION  . Vicodin [Hydrocodone-Acetaminophen]     Current Outpatient Prescriptions on File Prior to Visit  Medication Sig  Dispense Refill  . ALPRAZolam (XANAX) 0.5 MG tablet Take 0.5 mg by mouth at bedtime as needed. Patient states that she takes 1 1/2 every night to help with sleep    . alum & mag hydroxide-simeth (ANTACID ANTI-GAS MAX STRENGTH) 400-400-40 MG/5ML suspension Take 5 mLs by mouth as needed for indigestion.    . bisacodyl (DULCOLAX) 10 MG suppository Place 10 mg rectally every three (3) days as needed for moderate constipation.    Marland Kitchen levothyroxine (SYNTHROID, LEVOTHROID) 75 MCG tablet Take 75 mcg by mouth daily before breakfast.    . metoprolol (TOPROL-XL) 25 MG 24 hr tablet Take 12.5 mg by mouth daily.     Marland Kitchen omeprazole (PRILOSEC) 20 MG capsule Take 20 mg by mouth every morning.     . simvastatin (ZOCOR) 20 MG tablet Take 10 mg by mouth daily.     . timolol (BETIMOL) 0.5 % ophthalmic solution Place 1 drop into the right eye daily. Reported on 04/09/2016    . warfarin (COUMADIN) 3 MG tablet Take 3 mg by mouth as directed. Patient states that she is taking 3 mg daily     No current facility-administered medications on file prior to visit.         Objective:   Physical Exam Blood pressure 100/80, pulse 64, temperature 97.9 F (36.6 C), height 5\' 4"  (1.626 m), weight 147 lb  8 oz (66.906 kg). Alert and oriented. Skin warm and dry. Oral mucosa is moist.   . Sclera anicteric, conjunctivae is pink. Thyroid not enlarged. No cervical lymphadenopathy. Lungs clear. Heart regular rate and rhythm.  Abdomen is soft. Bowel sounds are positive. No hepatomegaly. No abdominal masses felt. No tenderness.  No edema to lower extremities.          Assessment & Plan:  Chronic constipation. She is having a BM daily. She eats prunes as needed. GERD for the most part controlled with Omeprazole.  OV in 1 year.

## 2016-04-09 NOTE — Progress Notes (Signed)
Subjective:    Patient ID: Leslie Rosario, female    DOB: 11-15-1923, 82 y.o.   MRN: [id number]  HPI    HPI Here today for f/u. She has chronic constipation. She usually has a BM every two days and sometimes it will be 3 days. She uses suppositories and stool softeners as needed. She has had two BMs today. She does eat prunes when she can remember to do so,.  No melena or BRRB. She says her left lower Abdomen has slight tenderness on and off. No fever associated with the tenderness.  Appetite is good. She has not lost any weight. She walks around her house if the weather is night. Her acid reflux is controlled with the Omeprazole. Hx of atrial fib and maintained on Coumadin.  Her last colonoscopy was in 2007 which revealed small external hemorrhoids. Left colonic diverticulosis. Most of the diverticula in the sigmoid colon. Biopsy: Adenomatous polyp.   Review of Systems Past Medical History  Diagnosis Date  . Hypothyroidism   . Atrial fibrillation ([place])   . Hyperlipidemia   . Constipation   . Nausea & vomiting   . GERD (gastroesophageal reflux disease)     Past Surgical History  Procedure Laterality Date  . Tonsillectomy    . Colonoscopy    . Upper gastrointestinal endoscopy    . Transthoracic echocardiogram  04/2012    EF=>55%, borderline conc LVH; LA mod dilated; RA mildly dilated; mild mitral annular calcif, mod MR; mild-mod TR with elevated RSVP 30-22mmHg, mild pulm HTN; mild calcif of AV leaflets, AV mildly sclerotic; aortic root sclerosis/calcif  . Nm myocar perf wall motion  04/2006    dipyridamole; normal pattern of perfusion to all regions, post-stress EF 73%    Allergies  Allergen Reactions  . Adhesive [Tape] Other (See Comments)    REACTION: pulls skin  . Codeine     REACTION: UNKNOWN REACTION  . Prednisone Other (See Comments)    REACTION: UNKNOWN  . Sulfonamide Derivatives     REACTION: UNKNOWN REACTION  . Vicodin [Hydrocodone-Acetaminophen]      Current Outpatient Prescriptions on File Prior to Visit  Medication Sig Dispense Refill  . ALPRAZolam (XANAX) 0.5 MG tablet Take 0.5 mg by mouth at bedtime as needed. Patient states that she takes 1 1/2 every night to help with sleep    . alum & mag hydroxide-simeth (ANTACID ANTI-GAS MAX STRENGTH) 400-400-40 MG/5ML suspension Take 5 mLs by mouth as needed for indigestion.    . bisacodyl (DULCOLAX) 10 MG suppository Place 10 mg rectally every three (3) days as needed for moderate constipation.    Marland Kitchen levothyroxine (SYNTHROID, LEVOTHROID) 75 MCG tablet Take 75 mcg by mouth daily before breakfast.    . metoprolol (TOPROL-XL) 25 MG 24 hr tablet Take 12.5 mg by mouth daily.     Marland Kitchen omeprazole (PRILOSEC) 20 MG capsule Take 20 mg by mouth every morning.     . simvastatin (ZOCOR) 20 MG tablet Take 10 mg by mouth daily.     . timolol (BETIMOL) 0.5 % ophthalmic solution Place 1 drop into the right eye daily. Reported on 04/09/2016    . warfarin (COUMADIN) 3 MG tablet Take 3 mg by mouth as directed. Patient states that she is taking 3 mg daily     No current facility-administered medications on file prior to visit.        Objective:   Physical Exam Filed Vitals:   04/09/16 1354  BP: 100/80  Pulse: 64  Temp: 97.9 F (36.6 C)  Height: 5\' 4"  (1.626 m)  Weight: 147 lb 8 oz (66.906 kg)  Alert and oriented. Skin warm and dry. Oral mucosa is moist.   . Sclera anicteric, conjunctivae is pink. Thyroid not enlarged. No cervical lymphadenopathy. Lungs clear. Heart regular rate and rhythm.  Abdomen is soft. Bowel sounds are positive. No hepatomegaly. No abdominal masses felt. No tenderness.  No edema to lower extremities.          Assessment & Plan:     Chronic constipation. She eats prunes and uses stool softeners on a weekly basis. GERD controlled at this with Omeprazole. OV 1 yr.

## 2016-07-21 ENCOUNTER — Encounter: Payer: Self-pay | Admitting: Cardiovascular Disease

## 2016-07-21 ENCOUNTER — Ambulatory Visit (INDEPENDENT_AMBULATORY_CARE_PROVIDER_SITE_OTHER): Payer: Medicare Other | Admitting: Cardiovascular Disease

## 2016-07-21 VITALS — BP 118/70 | HR 60 | Ht 63.0 in | Wt 146.0 lb

## 2016-07-21 DIAGNOSIS — Z7901 Long term (current) use of anticoagulants: Secondary | ICD-10-CM

## 2016-07-21 DIAGNOSIS — I480 Paroxysmal atrial fibrillation: Secondary | ICD-10-CM

## 2016-07-21 DIAGNOSIS — I10 Essential (primary) hypertension: Secondary | ICD-10-CM

## 2016-07-21 DIAGNOSIS — E039 Hypothyroidism, unspecified: Secondary | ICD-10-CM | POA: Diagnosis not present

## 2016-07-21 DIAGNOSIS — I482 Chronic atrial fibrillation: Secondary | ICD-10-CM

## 2016-07-21 DIAGNOSIS — E7800 Pure hypercholesterolemia, unspecified: Secondary | ICD-10-CM | POA: Diagnosis not present

## 2016-07-21 DIAGNOSIS — I4821 Permanent atrial fibrillation: Secondary | ICD-10-CM

## 2016-07-21 NOTE — Patient Instructions (Signed)
Your physician recommends that you return for lab work fasting.   Your physician wants you to follow-up in: 1 year or sooner if needed. You will receive a reminder letter in the mail two months in advance. If you don't receive a letter, please call our office to schedule the follow-up appointment.  If you need a refill on your cardiac medications before your next appointment, please call your pharmacy.    

## 2016-07-23 ENCOUNTER — Encounter: Payer: Self-pay | Admitting: Cardiovascular Disease

## 2016-07-23 DIAGNOSIS — I10 Essential (primary) hypertension: Secondary | ICD-10-CM | POA: Insufficient documentation

## 2016-07-23 NOTE — Progress Notes (Signed)
Patient ID: Leslie Rosario, female   DOB: 01/02/1923, 82 y.o.   MRN: [id number]    Primary M.D.: Dr. Wende Neighbors  HPI: Leslie Rosario is a 82 y.o. female who presents for a 54 month cardiology evaluation.  Leslie Rosario has a history of permanent atrial fibrillation and is on Coumadin anticoagulation. She has a history of hypothyroidism on thyroid replacement with Synthroid 75 mcg.There is also a history of hyperlipidemia and is on simvastatin 10 mg. She has  a history of hypertension as well as mild leg swelling.  In the past she had issues with leg edema but this has significant improved with support stockings.  She denies any episodes of chest pain.  She is unaware of her pulse being irregular.  She denies dizziness, presyncope or palpitations.  She presents for a 14 month evaluation.  Past Medical History:  Diagnosis Date  . Atrial fibrillation ([place])   . Constipation   . GERD (gastroesophageal reflux disease)   . Hyperlipidemia   . Hypothyroidism   . Nausea & vomiting     Past Surgical History:  Procedure Laterality Date  . COLONOSCOPY    . NM MYOCAR PERF WALL MOTION  04/2006   dipyridamole; normal pattern of perfusion to all regions, post-stress EF 73%  . TONSILLECTOMY    . TRANSTHORACIC ECHOCARDIOGRAM  04/2012   EF=>55%, borderline conc LVH; LA mod dilated; RA mildly dilated; mild mitral annular calcif, mod MR; mild-mod TR with elevated RSVP 30-49mHg, mild pulm HTN; mild calcif of AV leaflets, AV mildly sclerotic; aortic root sclerosis/calcif  . UPPER GASTROINTESTINAL ENDOSCOPY      Allergies  Allergen Reactions  . Adhesive [Tape] Other (See Comments)    REACTION: pulls skin  . Codeine     REACTION: UNKNOWN REACTION  . Prednisone Other (See Comments)    REACTION: UNKNOWN  . Sulfonamide Derivatives     REACTION: UNKNOWN REACTION  . Vicodin [Hydrocodone-Acetaminophen]     Current Outpatient Prescriptions  Medication Sig Dispense Refill  . ALPRAZolam (XANAX) 0.5 MG tablet Take  0.5 mg by mouth at bedtime as needed. Patient states that she takes 1 1/2 every night to help with sleep    . alum & mag hydroxide-simeth (ANTACID ANTI-GAS MAX STRENGTH) 400-400-40 MG/5ML suspension Take 5 mLs by mouth as needed for indigestion.    . bisacodyl (DULCOLAX) 10 MG suppository Place 10 mg rectally every three (3) days as needed for moderate constipation.    .Marland Kitchenlevothyroxine (SYNTHROID, LEVOTHROID) 75 MCG tablet Take 75 mcg by mouth daily before breakfast.    . metoprolol (TOPROL-XL) 25 MG 24 hr tablet Take 12.5 mg by mouth daily.     .Marland Kitchenomeprazole (PRILOSEC) 20 MG capsule Take 20 mg by mouth every morning.     . simvastatin (ZOCOR) 20 MG tablet Take 10 mg by mouth daily.     . timolol (BETIMOL) 0.5 % ophthalmic solution Place 1 drop into the right eye daily. Reported on 04/09/2016    . warfarin (COUMADIN) 3 MG tablet Take 3 mg by mouth as directed. Patient states that she is taking 3 mg daily     No current facility-administered medications for this visit.     Social History   Social History  . Marital status: Widowed    Spouse name: N/A  . Number of children: 3  . Years of education: N/A   Occupational History  .  Retired   Social History Main Topics  . Smoking status: Former Smoker  Quit date: 01/10/1985  . Smokeless tobacco: Never Used  . Alcohol use No  . Drug use: No  . Sexual activity: Not on file   Other Topics Concern  . Not on file   Social History Narrative  . No narrative on file   Social history is notable that she is widowed and has 3 children. Since her husband passed away her son was with her. There is remote tobacco history but she quit in 1986. She remains active.  Family History  Problem Relation Age of Onset  . Cancer Mother   . Ovarian cancer Mother   . Cancer Father   . Aortic aneurysm    . Cancer Brother   . Emphysema Brother   . Emphysema Brother   . Pneumonia Brother   . Aneurysm Brother   . Healthy Son   . Sleep apnea Son   .  Kidney cancer Son     ROS General: Negative; No fevers, chills, or night sweats;  HEENT: Negative; No changes in vision or hearing, sinus congestion, difficulty swallowing Pulmonary: Negative; No cough, wheezing, shortness of breath, hemoptysis Cardiovascular: Negative; No chest pain, presyncope, syncope, palpitations Resolution of prior edema GI: Negative; No nausea, vomiting, diarrhea, or abdominal pain GU: Negative; No dysuria, hematuria, or difficulty voiding Musculoskeletal: Negative; no myalgias, joint pain, or weakness Hematologic/Oncology: Negative; no easy bruising, bleeding; on Coumadin Endocrine: Positive for hypothyroidism Neuro: Negative; no changes in balance, headaches Skin: Negative; No rashes or skin lesions Psychiatric: Negative; No behavioral problems, depression Sleep: Negative; No snoring, daytime sleepiness, hypersomnolence, bruxism, restless legs, hypnogognic hallucinations, no cataplexy Other comprehensive 14 point system review is negative.   PE BP 118/70 (BP Location: Left Arm, Patient Position: Sitting, Cuff Size: Normal)   Pulse 60   Ht _0  (1.6 m)   Wt 146 lb (66.2 kg)   BMI 25.86 kg/m    Wt Readings from Last 3 Encounters:  07/21/16 146 lb (66.2 kg)  04/09/16 147 lb 8 oz (66.9 kg)  04/05/15 142 lb (64.4 kg)   General: Alert, oriented, no distress.  Skin: normal turgor, no rashes HEENT: Normocephalic, atraumatic. Pupils round and reactive; sclera anicteric;no lid lag. Extraocular muscles intact. No xanthelasma Nose without nasal septal hypertrophy Mouth/Parynx benign; Mallinpatti scale 2 Neck: No JVD, no carotid bruits; normal carotid upstroke Lungs: clear to ausculatation and percussion; no wheezing or rales Chest wall: no tenderness to palpitation Heart: Irregularly irregular rhythm with controlled ventricular response in the 60s;  s1 s2 normal ; faint 1/6 systolic murmur. No S3 gallop. No rub, thrill or heaves Abdomen: soft, nontender; no  hepatosplenomehaly, BS+; abdominal aorta nontender and not dilated by palpation. Back: no CVA tenderness Pulses 2+ Extremities: She is wearing support stockings; no clubbing cyanosis or edema, Homan's sign negative  Neurologic: grossly nonfocal; cranial nerves grossly normal. Psychologic: normal affect and mood.  ECG (independently read by me): Atrial fibrillation with ventricular rate at 60 bpm.  QTc interval normal at 436 ms.  April 2016 ECG (independently read by me): Atrial fibrillation with a rate at 53.  No significant ST-T change.  February 2015 ECG (independently read by me): Atrial fibrillation with controlled ventricular response of 62 beats per minute. No significant ST changes.  LABS  BMP Latest Ref Rng & Units 09/26/2014 07/19/2012 07/18/2012  Glucose 70 - 99 mg/dL 144(H) 104(H) 150(H)  BUN 6 - 23 mg/dL 25(H) 22 23  Creatinine 0.50 - 1.10 mg/dL 1.47(H) 1.04 1.28(H)  Sodium 137 - 147 mEq/L 138  136 131(L)  Potassium 3.7 - 5.3 mEq/L 4.2 3.5 3.8  Chloride 96 - 112 mEq/L 100 104 95(L)  CO2 19 - 32 mEq/L _0 Calcium 8.4 - 10.5 mg/dL 8.6 8.1(L) 9.2    Hepatic Function Latest Ref Rng & Units 07/19/2012 12/29/2011 12/29/2011  Total Protein 6.0 - 8.3 g/dL 5.6(L) 7.6 7.8  Albumin 3.5 - 5.2 g/dL 2.6(L) 4.0 4.0  AST 0 - 37 U/L 62(H) 24 23  ALT 0 - 35 U/L 39(H) 18 18  Alk Phosphatase 39 - 117 U/L 93 89 92  Total Bilirubin 0.3 - 1.2 mg/dL 1.0 0.4 0.5  Bilirubin, Direct 0.0 - 0.3 mg/dL - <0.1 -    CBC Latest Ref Rng & Units 09/26/2014 07/19/2012 07/18/2012  WBC 4.0 - 10.5 K/uL 15.7(H) 14.8(H) 23.8(H)  Hemoglobin 12.0 - 15.0 g/dL 13.0 11.6(L) 12.6  Hematocrit 36.0 - 46.0 % 38.1 33.9(L) 37.1  Platelets 150 - 400 K/uL 225 192 201   Lab Results  Component Value Date   MCV 93.2 09/26/2014   MCV 89.0 07/19/2012   MCV 90.0 07/18/2012    Lab Results  Component Value Date   TSH 2.134 12/24/2010    BNP No results found for: PROBNP  Lipid Panel  No results found for:  CHOL   RADIOLOGY: No results found.    ASSESSMENT AND PLAN: Leslie Rosario is a 82 year old female who appears younger than her stated age. She has a history of permanent atrial fibrillation.  I last saw her, I reduced her Toprol dose to 12.5 mg daily and presently, her ventricular rate continues to be well-controlled with a rate at 60.  Her blood pressure today is controlled.  She is on warfarin anticoagulation.  She denies bleeding.  An echo in May 2013  showed borderline concentric LVH with normal systolic function. She had moderate left atrial dilatation and mild RA dilatation. There was mild pulmonary hypertension with mild-to-moderate TR and moderate MR. There was also aortic sclerosis without stenosis in addition to aortic root sclerosis. She has a  history of hypothyroidism and continues Synthroid 75 mcg. Her peripheral edema has resolved and she continues to use her support stockings.  She continues to be on simvastatin at 20 mg for hyperlipidemia.  I am recommending follow-up blood work in the fasting state.  Adjustments to her medical regimen will be made if necessary.  She will continue her current medical regimen.  I will  see her in one year for reevaluation.  Time spent: 25 minutes  Troy Sine, MD, [facility]  07/23/2016 10:38 PM

## 2016-07-30 DIAGNOSIS — I10 Essential (primary) hypertension: Secondary | ICD-10-CM | POA: Diagnosis not present

## 2016-07-30 DIAGNOSIS — G3184 Mild cognitive impairment, so stated: Secondary | ICD-10-CM | POA: Diagnosis not present

## 2016-07-30 DIAGNOSIS — Z7901 Long term (current) use of anticoagulants: Secondary | ICD-10-CM | POA: Diagnosis not present

## 2016-07-30 DIAGNOSIS — I482 Chronic atrial fibrillation: Secondary | ICD-10-CM | POA: Diagnosis not present

## 2016-08-05 DIAGNOSIS — M2740 Unspecified cyst of jaw: Secondary | ICD-10-CM | POA: Diagnosis not present

## 2016-08-05 DIAGNOSIS — I482 Chronic atrial fibrillation: Secondary | ICD-10-CM | POA: Diagnosis not present

## 2016-08-05 DIAGNOSIS — R1010 Upper abdominal pain, unspecified: Secondary | ICD-10-CM | POA: Diagnosis not present

## 2016-08-27 DIAGNOSIS — R252 Cramp and spasm: Secondary | ICD-10-CM | POA: Diagnosis not present

## 2016-08-27 DIAGNOSIS — I10 Essential (primary) hypertension: Secondary | ICD-10-CM | POA: Diagnosis not present

## 2016-08-27 DIAGNOSIS — Z7901 Long term (current) use of anticoagulants: Secondary | ICD-10-CM | POA: Diagnosis not present

## 2016-08-27 DIAGNOSIS — I482 Chronic atrial fibrillation: Secondary | ICD-10-CM | POA: Diagnosis not present

## 2016-08-28 DIAGNOSIS — M6740 Ganglion, unspecified site: Secondary | ICD-10-CM | POA: Diagnosis not present

## 2016-08-28 DIAGNOSIS — L820 Inflamed seborrheic keratosis: Secondary | ICD-10-CM | POA: Diagnosis not present

## 2016-08-28 DIAGNOSIS — L821 Other seborrheic keratosis: Secondary | ICD-10-CM | POA: Diagnosis not present

## 2016-09-09 DIAGNOSIS — Z961 Presence of intraocular lens: Secondary | ICD-10-CM | POA: Diagnosis not present

## 2016-09-09 DIAGNOSIS — H02831 Dermatochalasis of right upper eyelid: Secondary | ICD-10-CM | POA: Diagnosis not present

## 2016-09-09 DIAGNOSIS — H04123 Dry eye syndrome of bilateral lacrimal glands: Secondary | ICD-10-CM | POA: Diagnosis not present

## 2016-09-09 DIAGNOSIS — H43391 Other vitreous opacities, right eye: Secondary | ICD-10-CM | POA: Diagnosis not present

## 2016-09-09 DIAGNOSIS — H40013 Open angle with borderline findings, low risk, bilateral: Secondary | ICD-10-CM | POA: Diagnosis not present

## 2016-09-09 DIAGNOSIS — H02834 Dermatochalasis of left upper eyelid: Secondary | ICD-10-CM | POA: Diagnosis not present

## 2016-09-15 ENCOUNTER — Ambulatory Visit (INDEPENDENT_AMBULATORY_CARE_PROVIDER_SITE_OTHER): Payer: Medicare Other | Admitting: Otolaryngology

## 2016-09-15 DIAGNOSIS — R590 Localized enlarged lymph nodes: Secondary | ICD-10-CM | POA: Diagnosis not present

## 2016-09-17 ENCOUNTER — Other Ambulatory Visit (INDEPENDENT_AMBULATORY_CARE_PROVIDER_SITE_OTHER): Payer: Self-pay | Admitting: Otolaryngology

## 2016-09-17 DIAGNOSIS — R220 Localized swelling, mass and lump, head: Principal | ICD-10-CM

## 2016-09-17 DIAGNOSIS — M278 Other specified diseases of jaws: Secondary | ICD-10-CM

## 2016-09-19 DIAGNOSIS — M179 Osteoarthritis of knee, unspecified: Secondary | ICD-10-CM | POA: Diagnosis not present

## 2016-09-19 DIAGNOSIS — I10 Essential (primary) hypertension: Secondary | ICD-10-CM | POA: Diagnosis not present

## 2016-09-19 DIAGNOSIS — F5101 Primary insomnia: Secondary | ICD-10-CM | POA: Diagnosis not present

## 2016-09-19 DIAGNOSIS — Z23 Encounter for immunization: Secondary | ICD-10-CM | POA: Diagnosis not present

## 2016-09-19 DIAGNOSIS — R1084 Generalized abdominal pain: Secondary | ICD-10-CM | POA: Diagnosis not present

## 2016-09-19 DIAGNOSIS — I482 Chronic atrial fibrillation: Secondary | ICD-10-CM | POA: Diagnosis not present

## 2016-09-19 DIAGNOSIS — R51 Headache: Secondary | ICD-10-CM | POA: Diagnosis not present

## 2016-09-19 DIAGNOSIS — R7301 Impaired fasting glucose: Secondary | ICD-10-CM | POA: Diagnosis not present

## 2016-09-19 DIAGNOSIS — G3184 Mild cognitive impairment, so stated: Secondary | ICD-10-CM | POA: Diagnosis not present

## 2016-09-19 DIAGNOSIS — E039 Hypothyroidism, unspecified: Secondary | ICD-10-CM | POA: Diagnosis not present

## 2016-09-30 ENCOUNTER — Ambulatory Visit (HOSPITAL_COMMUNITY)
Admission: RE | Admit: 2016-09-30 | Discharge: 2016-09-30 | Disposition: A | Discharge status: Home / Self Care | Payer: Medicare Other | Source: Ambulatory Visit | Attending: Otolaryngology | Admitting: Otolaryngology

## 2016-09-30 DIAGNOSIS — R221 Localized swelling, mass and lump, neck: Secondary | ICD-10-CM | POA: Diagnosis not present

## 2016-09-30 DIAGNOSIS — R220 Localized swelling, mass and lump, head: Secondary | ICD-10-CM | POA: Insufficient documentation

## 2016-09-30 DIAGNOSIS — J320 Chronic maxillary sinusitis: Secondary | ICD-10-CM | POA: Insufficient documentation

## 2016-09-30 DIAGNOSIS — M278 Other specified diseases of jaws: Secondary | ICD-10-CM

## 2016-09-30 LAB — POCT I-STAT CREATININE: Creatinine, Ser: 1.2 mg/dL — ABNORMAL HIGH (ref 0.44–1.00)

## 2016-09-30 MED ORDER — IOPAMIDOL (ISOVUE-300) INJECTION 61%
75.0000 mL | Freq: Once | INTRAVENOUS | Status: AC | PRN
  Administered 2016-09-30: 60 mL via INTRAVENOUS

## 2016-09-30 MED ORDER — IOPAMIDOL (ISOVUE-300) INJECTION 61%
INTRAVENOUS | Status: AC
  Filled 2016-09-30: qty 30

## 2016-10-06 ENCOUNTER — Ambulatory Visit (INDEPENDENT_AMBULATORY_CARE_PROVIDER_SITE_OTHER): Payer: Medicare Other | Admitting: Otolaryngology

## 2016-10-06 DIAGNOSIS — R590 Localized enlarged lymph nodes: Secondary | ICD-10-CM | POA: Diagnosis not present

## 2016-10-08 DIAGNOSIS — I482 Chronic atrial fibrillation: Secondary | ICD-10-CM | POA: Diagnosis not present

## 2016-10-08 DIAGNOSIS — I10 Essential (primary) hypertension: Secondary | ICD-10-CM | POA: Diagnosis not present

## 2016-10-08 DIAGNOSIS — G3184 Mild cognitive impairment, so stated: Secondary | ICD-10-CM | POA: Diagnosis not present

## 2016-10-22 DIAGNOSIS — I482 Chronic atrial fibrillation: Secondary | ICD-10-CM | POA: Diagnosis not present

## 2016-10-22 DIAGNOSIS — Z6825 Body mass index (BMI) 25.0-25.9, adult: Secondary | ICD-10-CM | POA: Diagnosis not present

## 2016-10-22 DIAGNOSIS — Z7901 Long term (current) use of anticoagulants: Secondary | ICD-10-CM | POA: Diagnosis not present

## 2016-10-22 DIAGNOSIS — G3184 Mild cognitive impairment, so stated: Secondary | ICD-10-CM | POA: Diagnosis not present

## 2016-10-22 DIAGNOSIS — M179 Osteoarthritis of knee, unspecified: Secondary | ICD-10-CM | POA: Diagnosis not present

## 2016-10-22 DIAGNOSIS — R252 Cramp and spasm: Secondary | ICD-10-CM | POA: Diagnosis not present

## 2016-10-22 DIAGNOSIS — I10 Essential (primary) hypertension: Secondary | ICD-10-CM | POA: Diagnosis not present

## 2016-10-29 DIAGNOSIS — H539 Unspecified visual disturbance: Secondary | ICD-10-CM | POA: Diagnosis not present

## 2016-10-29 DIAGNOSIS — Z6825 Body mass index (BMI) 25.0-25.9, adult: Secondary | ICD-10-CM | POA: Diagnosis not present

## 2016-10-29 DIAGNOSIS — R590 Localized enlarged lymph nodes: Secondary | ICD-10-CM | POA: Diagnosis not present

## 2016-11-19 DIAGNOSIS — I10 Essential (primary) hypertension: Secondary | ICD-10-CM | POA: Diagnosis not present

## 2016-11-19 DIAGNOSIS — I4891 Unspecified atrial fibrillation: Secondary | ICD-10-CM | POA: Diagnosis not present

## 2016-11-20 ENCOUNTER — Ambulatory Visit (INDEPENDENT_AMBULATORY_CARE_PROVIDER_SITE_OTHER): Payer: Medicare Other | Admitting: Otolaryngology

## 2016-11-21 DIAGNOSIS — J060 Acute laryngopharyngitis: Secondary | ICD-10-CM | POA: Diagnosis not present

## 2016-11-21 DIAGNOSIS — R05 Cough: Secondary | ICD-10-CM | POA: Diagnosis not present

## 2016-12-22 ENCOUNTER — Ambulatory Visit (INDEPENDENT_AMBULATORY_CARE_PROVIDER_SITE_OTHER): Payer: Medicare Other | Admitting: Otolaryngology

## 2017-01-08 ENCOUNTER — Ambulatory Visit (INDEPENDENT_AMBULATORY_CARE_PROVIDER_SITE_OTHER): Payer: Medicare Other | Admitting: Otolaryngology

## 2017-01-08 DIAGNOSIS — R220 Localized swelling, mass and lump, head: Secondary | ICD-10-CM

## 2017-01-14 DIAGNOSIS — Z7901 Long term (current) use of anticoagulants: Secondary | ICD-10-CM | POA: Diagnosis not present

## 2017-01-14 DIAGNOSIS — I10 Essential (primary) hypertension: Secondary | ICD-10-CM | POA: Diagnosis not present

## 2017-01-14 DIAGNOSIS — I482 Chronic atrial fibrillation: Secondary | ICD-10-CM | POA: Diagnosis not present

## 2017-02-04 DIAGNOSIS — E039 Hypothyroidism, unspecified: Secondary | ICD-10-CM | POA: Diagnosis not present

## 2017-02-04 DIAGNOSIS — Z7901 Long term (current) use of anticoagulants: Secondary | ICD-10-CM | POA: Diagnosis not present

## 2017-02-04 DIAGNOSIS — R7301 Impaired fasting glucose: Secondary | ICD-10-CM | POA: Diagnosis not present

## 2017-02-04 DIAGNOSIS — I482 Chronic atrial fibrillation: Secondary | ICD-10-CM | POA: Diagnosis not present

## 2017-02-25 ENCOUNTER — Encounter (INDEPENDENT_AMBULATORY_CARE_PROVIDER_SITE_OTHER): Payer: Self-pay | Admitting: Internal Medicine

## 2017-03-04 DIAGNOSIS — I482 Chronic atrial fibrillation: Secondary | ICD-10-CM | POA: Diagnosis not present

## 2017-03-04 DIAGNOSIS — M179 Osteoarthritis of knee, unspecified: Secondary | ICD-10-CM | POA: Diagnosis not present

## 2017-03-04 DIAGNOSIS — I10 Essential (primary) hypertension: Secondary | ICD-10-CM | POA: Diagnosis not present

## 2017-03-04 DIAGNOSIS — R7301 Impaired fasting glucose: Secondary | ICD-10-CM | POA: Diagnosis not present

## 2017-04-01 DIAGNOSIS — I482 Chronic atrial fibrillation: Secondary | ICD-10-CM | POA: Diagnosis not present

## 2017-04-01 DIAGNOSIS — M1990 Unspecified osteoarthritis, unspecified site: Secondary | ICD-10-CM | POA: Diagnosis not present

## 2017-04-01 DIAGNOSIS — Z7901 Long term (current) use of anticoagulants: Secondary | ICD-10-CM | POA: Diagnosis not present

## 2017-04-01 DIAGNOSIS — I10 Essential (primary) hypertension: Secondary | ICD-10-CM | POA: Diagnosis not present

## 2017-04-08 ENCOUNTER — Ambulatory Visit (INDEPENDENT_AMBULATORY_CARE_PROVIDER_SITE_OTHER): Payer: Medicare Other | Admitting: Internal Medicine

## 2017-04-08 ENCOUNTER — Encounter (INDEPENDENT_AMBULATORY_CARE_PROVIDER_SITE_OTHER): Payer: Self-pay

## 2017-04-08 ENCOUNTER — Encounter (INDEPENDENT_AMBULATORY_CARE_PROVIDER_SITE_OTHER): Payer: Self-pay | Admitting: Internal Medicine

## 2017-04-08 VITALS — BP 132/58 | HR 56 | Temp 98.7°F | Ht 64.0 in | Wt 144.2 lb

## 2017-04-08 DIAGNOSIS — K219 Gastro-esophageal reflux disease without esophagitis: Secondary | ICD-10-CM | POA: Diagnosis not present

## 2017-04-08 DIAGNOSIS — K5909 Other constipation: Secondary | ICD-10-CM | POA: Diagnosis not present

## 2017-04-08 NOTE — Patient Instructions (Signed)
GERD. Continue the Omeprazole. Constipation: Supp as needed.

## 2017-04-08 NOTE — Progress Notes (Signed)
Subjective:    Patient ID: Leslie Rosario, female    DOB: March 17, 1923, 83 y.o.   MRN: [id number]  HPI Here today for f/u. She was last seen by me in April of 2017. Hx of chronic constipation. She tells me she is doing good for the most part. She says she has a new pain every day.  She occassionally has cramps in her feet and hands.  She also has some constipation. She has a BM every 2-3 days. Uses suppositories as needed. No melena or BRRB. She exercises when it is pretty weather.  Her last colonoscopy was in 2007 which revealed small external hemorrhoids. Left colonic diverticulosis. Most of the diverticula in the sigmoid colon. Biopsy: Adenomatous polyp.  Hx of atrial fib and maintained on Coumadin.  Review of Systems Past Medical History:  Diagnosis Date  . Atrial fibrillation ([place])   . Constipation   . GERD (gastroesophageal reflux disease)   . Hyperlipidemia   . Hypothyroidism   . Nausea & vomiting     Past Surgical History:  Procedure Laterality Date  . COLONOSCOPY    . NM MYOCAR PERF WALL MOTION  04/2006   dipyridamole; normal pattern of perfusion to all regions, post-stress EF 73%  . TONSILLECTOMY    . TRANSTHORACIC ECHOCARDIOGRAM  04/2012   EF=>55%, borderline conc LVH; LA mod dilated; RA mildly dilated; mild mitral annular calcif, mod MR; mild-mod TR with elevated RSVP 30-41mmHg, mild pulm HTN; mild calcif of AV leaflets, AV mildly sclerotic; aortic root sclerosis/calcif  . UPPER GASTROINTESTINAL ENDOSCOPY      Allergies  Allergen Reactions  . Adhesive [Tape] Other (See Comments)    REACTION: pulls skin  . Codeine     REACTION: UNKNOWN REACTION  . Prednisone Other (See Comments)    REACTION: UNKNOWN  . Sulfonamide Derivatives     REACTION: UNKNOWN REACTION  . Vicodin [Hydrocodone-Acetaminophen]     Current Outpatient Prescriptions on File Prior to Visit  Medication Sig Dispense Refill  . ALPRAZolam (XANAX) 0.5 MG tablet Take 0.5 mg by mouth at bedtime as  needed. Patient states that she takes 1 1/2 every night to help with sleep    . alum & mag hydroxide-simeth (ANTACID ANTI-GAS MAX STRENGTH) 400-400-40 MG/5ML suspension Take 5 mLs by mouth as needed for indigestion.    . bisacodyl (DULCOLAX) 10 MG suppository Place 10 mg rectally every three (3) days as needed for moderate constipation.    Marland Kitchen levothyroxine (SYNTHROID, LEVOTHROID) 75 MCG tablet Take 75 mcg by mouth daily before breakfast.    . metoprolol (TOPROL-XL) 25 MG 24 hr tablet Take 12.5 mg by mouth daily.     Marland Kitchen omeprazole (PRILOSEC) 20 MG capsule Take 20 mg by mouth every morning.     . simvastatin (ZOCOR) 20 MG tablet Take 10 mg by mouth daily.     . timolol (BETIMOL) 0.5 % ophthalmic solution Place 1 drop into the right eye daily. Reported on 04/09/2016    . warfarin (COUMADIN) 3 MG tablet Take 3 mg by mouth as directed. Patient states that she is taking 3 mg daily     No current facility-administered medications on file prior to visit.        Objective:   Physical Exam Blood pressure (!) 132/58, pulse (!) 56, temperature 98.7 F (37.1 C), height 5\' 4"  (1.626 m), weight 144 lb 3.2 oz (65.4 kg). Alert and oriented. Skin warm and dry. Oral mucosa is moist.   . Sclera anicteric, conjunctivae is  pink. Thyroid not enlarged. No cervical lymphadenopathy. Lungs clear. Heart regular rate and rhythm.  Abdomen is soft. Bowel sounds are positive. No hepatomegaly. No abdominal masses felt. No tenderness.  No edema to lower extremities.          Assessment & Plan:  Constipation.  GERD controlled with Omeprazole. OV in1  Year.

## 2017-04-09 ENCOUNTER — Ambulatory Visit (INDEPENDENT_AMBULATORY_CARE_PROVIDER_SITE_OTHER): Payer: Medicare Other | Admitting: Internal Medicine

## 2017-04-13 DIAGNOSIS — H6120 Impacted cerumen, unspecified ear: Secondary | ICD-10-CM | POA: Diagnosis not present

## 2017-05-06 DIAGNOSIS — M19079 Primary osteoarthritis, unspecified ankle and foot: Secondary | ICD-10-CM | POA: Diagnosis not present

## 2017-05-06 DIAGNOSIS — I482 Chronic atrial fibrillation: Secondary | ICD-10-CM | POA: Diagnosis not present

## 2017-05-06 DIAGNOSIS — Z6824 Body mass index (BMI) 24.0-24.9, adult: Secondary | ICD-10-CM | POA: Diagnosis not present

## 2017-05-06 DIAGNOSIS — I10 Essential (primary) hypertension: Secondary | ICD-10-CM | POA: Diagnosis not present

## 2017-06-04 DIAGNOSIS — M545 Low back pain: Secondary | ICD-10-CM | POA: Diagnosis not present

## 2017-06-04 DIAGNOSIS — M179 Osteoarthritis of knee, unspecified: Secondary | ICD-10-CM | POA: Diagnosis not present

## 2017-06-10 DIAGNOSIS — Z7901 Long term (current) use of anticoagulants: Secondary | ICD-10-CM | POA: Diagnosis not present

## 2017-06-10 DIAGNOSIS — I482 Chronic atrial fibrillation: Secondary | ICD-10-CM | POA: Diagnosis not present

## 2017-06-10 DIAGNOSIS — I10 Essential (primary) hypertension: Secondary | ICD-10-CM | POA: Diagnosis not present

## 2017-06-10 DIAGNOSIS — E782 Mixed hyperlipidemia: Secondary | ICD-10-CM | POA: Diagnosis not present

## 2017-06-14 DIAGNOSIS — S22080A Wedge compression fracture of T11-T12 vertebra, initial encounter for closed fracture: Secondary | ICD-10-CM

## 2017-06-14 HISTORY — DX: Wedge compression fracture of T11-T12 vertebra, initial encounter for closed fracture: S22.080A

## 2017-06-26 DIAGNOSIS — M545 Low back pain: Secondary | ICD-10-CM | POA: Diagnosis not present

## 2017-06-29 DIAGNOSIS — M545 Low back pain: Secondary | ICD-10-CM | POA: Diagnosis not present

## 2017-06-29 DIAGNOSIS — M62830 Muscle spasm of back: Secondary | ICD-10-CM | POA: Diagnosis not present

## 2017-07-03 ENCOUNTER — Encounter (HOSPITAL_COMMUNITY): Payer: Self-pay

## 2017-07-03 ENCOUNTER — Emergency Department (HOSPITAL_COMMUNITY): Payer: Medicare Other

## 2017-07-03 ENCOUNTER — Emergency Department (HOSPITAL_COMMUNITY)
Admission: EM | Admit: 2017-07-03 | Discharge: 2017-07-03 | Disposition: A | Discharge status: Home / Self Care | Payer: Medicare Other | Attending: Emergency Medicine | Admitting: Emergency Medicine

## 2017-07-03 DIAGNOSIS — M545 Low back pain: Secondary | ICD-10-CM | POA: Diagnosis not present

## 2017-07-03 DIAGNOSIS — S22080A Wedge compression fracture of T11-T12 vertebra, initial encounter for closed fracture: Secondary | ICD-10-CM | POA: Diagnosis not present

## 2017-07-03 DIAGNOSIS — Z7901 Long term (current) use of anticoagulants: Secondary | ICD-10-CM | POA: Diagnosis not present

## 2017-07-03 DIAGNOSIS — Y999 Unspecified external cause status: Secondary | ICD-10-CM | POA: Diagnosis not present

## 2017-07-03 DIAGNOSIS — N200 Calculus of kidney: Secondary | ICD-10-CM | POA: Diagnosis not present

## 2017-07-03 DIAGNOSIS — S20409A Unspecified superficial injuries of unspecified back wall of thorax, initial encounter: Secondary | ICD-10-CM | POA: Diagnosis present

## 2017-07-03 DIAGNOSIS — R1084 Generalized abdominal pain: Secondary | ICD-10-CM | POA: Insufficient documentation

## 2017-07-03 DIAGNOSIS — I10 Essential (primary) hypertension: Secondary | ICD-10-CM | POA: Diagnosis not present

## 2017-07-03 DIAGNOSIS — Z87440 Personal history of urinary (tract) infections: Secondary | ICD-10-CM | POA: Insufficient documentation

## 2017-07-03 DIAGNOSIS — Z79899 Other long term (current) drug therapy: Secondary | ICD-10-CM | POA: Diagnosis not present

## 2017-07-03 DIAGNOSIS — Z87891 Personal history of nicotine dependence: Secondary | ICD-10-CM | POA: Diagnosis not present

## 2017-07-03 DIAGNOSIS — X58XXXA Exposure to other specified factors, initial encounter: Secondary | ICD-10-CM | POA: Diagnosis not present

## 2017-07-03 DIAGNOSIS — E039 Hypothyroidism, unspecified: Secondary | ICD-10-CM | POA: Diagnosis not present

## 2017-07-03 DIAGNOSIS — Y929 Unspecified place or not applicable: Secondary | ICD-10-CM | POA: Diagnosis not present

## 2017-07-03 DIAGNOSIS — Y939 Activity, unspecified: Secondary | ICD-10-CM | POA: Insufficient documentation

## 2017-07-03 DIAGNOSIS — I4891 Unspecified atrial fibrillation: Secondary | ICD-10-CM | POA: Diagnosis not present

## 2017-07-03 LAB — URINALYSIS, ROUTINE W REFLEX MICROSCOPIC
Bacteria, UA: NONE SEEN
Bilirubin Urine: NEGATIVE
Glucose, UA: NEGATIVE mg/dL
Ketones, ur: NEGATIVE mg/dL
Leukocytes, UA: NEGATIVE
Nitrite: NEGATIVE
Protein, ur: NEGATIVE mg/dL
Specific Gravity, Urine: 1.005 (ref 1.005–1.030)
Squamous Epithelial / LPF: NONE SEEN
pH: 7 (ref 5.0–8.0)

## 2017-07-03 LAB — I-STAT CHEM 8, ED
BUN: 17 mg/dL (ref 6–20)
Calcium, Ion: 1.14 mmol/L — ABNORMAL LOW (ref 1.15–1.40)
Chloride: 104 mmol/L (ref 101–111)
Creatinine, Ser: 1 mg/dL (ref 0.44–1.00)
Glucose, Bld: 106 mg/dL — ABNORMAL HIGH (ref 65–99)
HCT: 40 % (ref 36.0–46.0)
Hemoglobin: 13.6 g/dL (ref 12.0–15.0)
Potassium: 4.2 mmol/L (ref 3.5–5.1)
Sodium: 140 mmol/L (ref 135–145)
TCO2: 25 mmol/L (ref 0–100)

## 2017-07-03 MED ORDER — OXYCODONE-ACETAMINOPHEN 5-325 MG PO TABS
0.5000 | ORAL_TABLET | Freq: Once | ORAL | Status: AC
  Administered 2017-07-03: 0.5 via ORAL
  Filled 2017-07-03: qty 1

## 2017-07-03 MED ORDER — OXYCODONE-ACETAMINOPHEN 5-325 MG PO TABS: 0.5000 | ORAL_TABLET | Freq: Four times a day (QID) | ORAL | 0 refills | Status: DC | PRN

## 2017-07-03 NOTE — ED Provider Notes (Signed)
[facility] DEPT Provider Note   CSN: [id number] Arrival date & time: 07/03/17  0910     History   Chief Complaint Chief Complaint  Patient presents with  . Back Pain    HPI SHAMBRIA CAMERER is a 83 y.o. female.   Back Pain   This is a new problem. The current episode started more than 2 days ago. The problem occurs constantly. The problem has been gradually worsening. The pain is associated with no known injury. The pain is present in the lumbar spine and thoracic spine. The quality of the pain is described as stabbing. Pertinent negatives include no abdominal pain and no dysuria. She has tried NSAIDs for the symptoms.    Past Medical History:  Diagnosis Date  . Atrial fibrillation ([place])   . Constipation   . GERD (gastroesophageal reflux disease)   . Hyperlipidemia   . Hypothyroidism   . Nausea & vomiting     Patient Active Problem List   Diagnosis Date Noted  . Essential hypertension 07/23/2016  . Hypothyroidism 03/30/2015  . Mild pulmonary hypertension ([place]) 01/16/2014  . Moderate mitral regurgitation by prior echocardiogram 01/16/2014  . Long term current use of anticoagulant therapy 02/20/2013  . Dehydration 07/18/2012  . UTI (urinary tract infection) 07/18/2012  . Constipation 02/17/2012  . Acute gastritis 12/29/2011  . GERD (gastroesophageal reflux disease)   . Atrial fibrillation ([place]) 09/12/2010  . POLYDIPSIA 09/12/2010  . DYSPHAGIA 08/22/2010  . ABDOMINAL PAIN, LEFT LOWER QUADRANT 03/05/2010  . HYPERCHOLESTEROLEMIA 03/04/2010  . HYPERLIPIDEMIA 03/04/2010  . ANXIETY 03/04/2010  . GERD 03/04/2010  . HIATAL HERNIA, HX OF 03/04/2010    Past Surgical History:  Procedure Laterality Date  . COLONOSCOPY    . NM MYOCAR PERF WALL MOTION  04/2006   dipyridamole; normal pattern of perfusion to all regions, post-stress EF 73%  . TONSILLECTOMY    . TRANSTHORACIC ECHOCARDIOGRAM  04/2012   EF=>55%, borderline conc LVH; LA mod dilated; RA mildly dilated; mild  mitral annular calcif, mod MR; mild-mod TR with elevated RSVP 30-12mmHg, mild pulm HTN; mild calcif of AV leaflets, AV mildly sclerotic; aortic root sclerosis/calcif  . UPPER GASTROINTESTINAL ENDOSCOPY      OB History    No data available       Home Medications    Prior to Admission medications   Medication Sig Start Date End Date Taking? Authorizing Provider  acetaminophen (TYLENOL) 500 MG tablet Take 500 mg by mouth every 6 (six) hours as needed.   Yes [provider]  ALPRAZolam Duanne Moron) 0.5 MG tablet Take 0.5 mg by mouth at bedtime as needed. Patient states that she takes 1 1/2 every night to help with sleep   Yes [provider]  alum & mag hydroxide-simeth (ANTACID ANTI-GAS MAX STRENGTH) 400-400-40 MG/5ML suspension Take 5 mLs by mouth as needed for indigestion.   Yes [provider]  bisacodyl (DULCOLAX) 10 MG suppository Place 10 mg rectally every three (3) days as needed for moderate constipation.   Yes [provider]  diclofenac sodium (VOLTAREN) 1 % GEL Apply topically 4 (four) times daily.   Yes [provider]  levothyroxine (SYNTHROID, LEVOTHROID) 75 MCG tablet Take 75 mcg by mouth daily before breakfast.   Yes [provider]  lidocaine (LIDODERM) 5 % Place 1 patch onto the skin daily. Remove & Discard patch within 12 hours or as directed by MD   Yes [provider]  methocarbamol (ROBAXIN) 500 MG tablet Take 500 mg by mouth  2 (two) times daily as needed for muscle spasms.   Yes [provider]  metoprolol (TOPROL-XL) 25 MG 24 hr tablet Take 12.5 mg by mouth daily.    Yes [provider]  omeprazole (PRILOSEC) 20 MG capsule Take 20 mg by mouth every morning.    Yes [provider]  simvastatin (ZOCOR) 20 MG tablet Take 10 mg by mouth daily.    Yes [provider]  timolol (BETIMOL) 0.5 % ophthalmic solution Place 1 drop into the left eye daily. Reported on 04/09/2016   Yes  [provider]  warfarin (COUMADIN) 3 MG tablet Take 3 mg by mouth as directed. Patient states that she is taking 3 mg daily   Yes [provider]  oxyCODONE-acetaminophen (PERCOCET) 5-325 MG tablet Take 0.5-1 tablets by mouth every 6 (six) hours as needed for severe pain. 07/03/17   Daryana Whirley, Corene Cornea, MD    Family History Family History  Problem Relation Age of Onset  . Cancer Mother   . Ovarian cancer Mother   . Cancer Father   . Cancer Brother   . Emphysema Brother   . Emphysema Brother   . Pneumonia Brother   . Aneurysm Brother   . Healthy Son   . Sleep apnea Son   . Aortic aneurysm Unknown   . Kidney cancer Son     Social History Social History  Substance Use Topics  . Smoking status: Former Smoker    Quit date: 01/10/1985  . Smokeless tobacco: Never Used  . Alcohol use No     Allergies   Adhesive [tape]; Codeine; Prednisone; Sulfonamide derivatives; and Vicodin [hydrocodone-acetaminophen]   Review of Systems Review of Systems  Gastrointestinal: Negative for abdominal pain.  Genitourinary: Negative for dysuria.  Musculoskeletal: Positive for back pain.  All other systems reviewed and are negative.    Physical Exam Updated Vital Signs BP (!) 176/83   Pulse (!) 48   Temp 97.8 F (36.6 C) (Oral)   Resp 17   Wt 64.9 kg (143 lb)   SpO2 93%   BMI 24.55 kg/m   Physical Exam  Constitutional: She appears well-developed and well-nourished.  HENT:  Head: Normocephalic and atraumatic.  Eyes: Conjunctivae and EOM are normal.  Neck: Normal range of motion.  Cardiovascular: Normal rate and regular rhythm.   Pulmonary/Chest: No stridor. No respiratory distress.  Abdominal: Soft. She exhibits no distension.  Musculoskeletal: She exhibits tenderness (upper lumbar, lower thoracic spinal processes).  Neurological: She is alert.  Skin: Skin is warm and dry.  Nursing note and vitals reviewed.    ED Treatments / Results  Labs (all labs ordered are  listed, but only abnormal results are displayed) Labs Reviewed  URINALYSIS, ROUTINE W REFLEX MICROSCOPIC - Abnormal; Notable for the following:       Result Value   Color, Urine STRAW (*)    Hgb urine dipstick MODERATE (*)    All other components within normal limits  I-STAT CHEM 8, ED - Abnormal; Notable for the following:    Glucose, Bld 106 (*)    Calcium, Ion 1.14 (*)    All other components within normal limits    EKG  EKG Interpretation None       Radiology Ct Renal Stone Study  Result Date: 07/03/2017 CLINICAL DATA:  Pt reports that lower back started hurting last Friday. Pt went to see Dr Nevada Crane and was given robaxin. Pain has not gotten any better. Pain now radiates into right abdomen. Pain started in  abdomen. Pt has marked on skin where pain is located. States she has noticed she is voiding more frequently EXAM: CT ABDOMEN AND PELVIS WITHOUT CONTRAST TECHNIQUE: Multidetector CT imaging of the abdomen and pelvis was performed following the standard protocol without IV contrast. COMPARISON:  CT, 01/22/2012 FINDINGS: Lower chest: Heart is borderline enlarged. There are coronary artery calcifications. Small left pleural effusion. Mild lung base interstitial thickening as well as reticular opacities, the latter consistent scarring or atelectasis. Hepatobiliary: No focal liver abnormality is seen. No gallstones, gallbladder wall thickening, or biliary dilatation. Pancreas: Partial fatty replacement. No masses. No inflammation. No duct dilation PE Spleen: Normal in size without focal abnormality. Adrenals/Urinary Tract: No adrenal masses. Small nonobstructing stone in the lower pole of the left kidney. No renal masses. No hydronephrosis. Normal ureters. Bladder is unremarkable. Stomach/Bowel: Stomach and small bowel unremarkable. There are numerous colonic diverticula. No diverticulitis. Colon otherwise unremarkable. Normal appendix. Vascular/Lymphatic: Aortic atherosclerosis. No enlarged  abdominal or pelvic lymph nodes. Reproductive: Uterus and bilateral adnexa are unremarkable. Other: No hernia or ascites. Musculoskeletal: There is mild upper depression of the lower endplate of S96 with evidence of disruption of the anterior inferior cortical margin of the vertebral body. This may be a recent fracture, suggested by mild hazy opacity in the perivertebral fat consistent with edema. All remaining visualized vertebra are normal in height. There is no other evidence of a fracture. No osteoblastic or osteolytic lesions. IMPRESSION: 1. Mild compression fracture of T12, which is likely recent. The chronicity of this could be further assessed, if desired clinically, with MRI. 2. No other evidence of a recent or acute abnormality in the abdomen or pelvis. 3. Small left pleural effusion. Chronic appearing lung base interstitial thickening and subsegmental atelectasis and/or scarring. 4. Small nonobstructing left intrarenal stone. 5. Aortic atherosclerosis. Electronically Signed   By: Lajean Manes M.D.   On: 07/03/2017 11:27    Procedures Procedures (including critical care time)  Medications Ordered in ED Medications  oxyCODONE-acetaminophen (PERCOCET/ROXICET) 5-325 MG per tablet 0.5 tablet (0.5 tablets Oral Given 07/03/17 1222)     Initial Impression / Assessment and Plan / ED Course  I have reviewed the triage vital signs and the nursing notes.  Pertinent labs & imaging results that were available during my care of the patient were reviewed by me and considered in my medical decision making (see chart for details).     What appears to be an acute vertebral fracture. Ambulates wtihout difficulty with small dose of pain medicine. Will provide rx for same at home and recommend pcp follow up for reevaluation and consideration for need for further therapy.   Final Clinical Impressions(s) / ED Diagnoses   Final diagnoses:  T12 compression fracture ([place])     Donnice Nielsen, Corene Cornea, MD 07/03/17  1652

## 2017-07-03 NOTE — ED Triage Notes (Signed)
Pt reports that lower back started hurting last Friday. Pt went to see Dr Nevada Crane and was given robaxin. Pain has not gotten any better. Pain now radiates into right abdomen. Pain started in abdomen. Pt has marked on skin where pain is located. States she has noticed she is voiding more frequently

## 2017-07-03 NOTE — ED Notes (Signed)
Pt. ambulated in hall.

## 2017-07-08 DIAGNOSIS — M179 Osteoarthritis of knee, unspecified: Secondary | ICD-10-CM | POA: Diagnosis not present

## 2017-07-08 DIAGNOSIS — E782 Mixed hyperlipidemia: Secondary | ICD-10-CM | POA: Diagnosis not present

## 2017-07-08 DIAGNOSIS — F5101 Primary insomnia: Secondary | ICD-10-CM | POA: Diagnosis not present

## 2017-07-08 DIAGNOSIS — E039 Hypothyroidism, unspecified: Secondary | ICD-10-CM | POA: Diagnosis not present

## 2017-07-08 DIAGNOSIS — Z7901 Long term (current) use of anticoagulants: Secondary | ICD-10-CM | POA: Diagnosis not present

## 2017-07-08 DIAGNOSIS — R7301 Impaired fasting glucose: Secondary | ICD-10-CM | POA: Diagnosis not present

## 2017-07-10 DIAGNOSIS — S22080A Wedge compression fracture of T11-T12 vertebra, initial encounter for closed fracture: Secondary | ICD-10-CM | POA: Diagnosis not present

## 2017-07-10 DIAGNOSIS — Z0000 Encounter for general adult medical examination without abnormal findings: Secondary | ICD-10-CM | POA: Diagnosis not present

## 2017-07-10 DIAGNOSIS — Z6824 Body mass index (BMI) 24.0-24.9, adult: Secondary | ICD-10-CM | POA: Diagnosis not present

## 2017-07-10 DIAGNOSIS — I10 Essential (primary) hypertension: Secondary | ICD-10-CM | POA: Diagnosis not present

## 2017-07-29 DIAGNOSIS — Z6823 Body mass index (BMI) 23.0-23.9, adult: Secondary | ICD-10-CM | POA: Diagnosis not present

## 2017-07-29 DIAGNOSIS — M545 Low back pain: Secondary | ICD-10-CM | POA: Diagnosis not present

## 2017-07-29 DIAGNOSIS — S22000A Wedge compression fracture of unspecified thoracic vertebra, initial encounter for closed fracture: Secondary | ICD-10-CM | POA: Diagnosis not present

## 2017-08-13 DIAGNOSIS — M545 Low back pain: Secondary | ICD-10-CM | POA: Diagnosis not present

## 2017-08-13 DIAGNOSIS — R2689 Other abnormalities of gait and mobility: Secondary | ICD-10-CM | POA: Diagnosis not present

## 2017-08-26 DIAGNOSIS — Z7901 Long term (current) use of anticoagulants: Secondary | ICD-10-CM | POA: Diagnosis not present

## 2017-08-26 DIAGNOSIS — M545 Low back pain: Secondary | ICD-10-CM | POA: Diagnosis not present

## 2017-08-26 DIAGNOSIS — Z6823 Body mass index (BMI) 23.0-23.9, adult: Secondary | ICD-10-CM | POA: Diagnosis not present

## 2017-08-26 DIAGNOSIS — M25552 Pain in left hip: Secondary | ICD-10-CM | POA: Diagnosis not present

## 2017-09-12 ENCOUNTER — Encounter (HOSPITAL_COMMUNITY): Payer: Self-pay

## 2017-09-12 ENCOUNTER — Emergency Department (HOSPITAL_COMMUNITY): Payer: Medicare Other

## 2017-09-12 ENCOUNTER — Observation Stay (HOSPITAL_COMMUNITY)
Admission: EM | Admit: 2017-09-12 | Discharge: 2017-09-14 | Disposition: A | Discharge status: Home / Self Care | Payer: Medicare Other | Attending: Internal Medicine | Admitting: Internal Medicine

## 2017-09-12 DIAGNOSIS — R2689 Other abnormalities of gait and mobility: Secondary | ICD-10-CM | POA: Diagnosis not present

## 2017-09-12 DIAGNOSIS — F1390 Sedative, hypnotic, or anxiolytic use, unspecified, uncomplicated: Secondary | ICD-10-CM | POA: Diagnosis not present

## 2017-09-12 DIAGNOSIS — E785 Hyperlipidemia, unspecified: Secondary | ICD-10-CM | POA: Diagnosis not present

## 2017-09-12 DIAGNOSIS — I482 Chronic atrial fibrillation: Secondary | ICD-10-CM | POA: Diagnosis not present

## 2017-09-12 DIAGNOSIS — G9340 Encephalopathy, unspecified: Secondary | ICD-10-CM

## 2017-09-12 DIAGNOSIS — F1190 Opioid use, unspecified, uncomplicated: Secondary | ICD-10-CM | POA: Diagnosis present

## 2017-09-12 DIAGNOSIS — Z79899 Other long term (current) drug therapy: Secondary | ICD-10-CM

## 2017-09-12 DIAGNOSIS — R2681 Unsteadiness on feet: Secondary | ICD-10-CM | POA: Insufficient documentation

## 2017-09-12 DIAGNOSIS — Z87891 Personal history of nicotine dependence: Secondary | ICD-10-CM | POA: Insufficient documentation

## 2017-09-12 DIAGNOSIS — I509 Heart failure, unspecified: Secondary | ICD-10-CM | POA: Diagnosis not present

## 2017-09-12 DIAGNOSIS — R4182 Altered mental status, unspecified: Secondary | ICD-10-CM | POA: Diagnosis not present

## 2017-09-12 DIAGNOSIS — I110 Hypertensive heart disease with heart failure: Secondary | ICD-10-CM | POA: Diagnosis not present

## 2017-09-12 DIAGNOSIS — Z7901 Long term (current) use of anticoagulants: Secondary | ICD-10-CM

## 2017-09-12 DIAGNOSIS — Z79891 Long term (current) use of opiate analgesic: Secondary | ICD-10-CM | POA: Diagnosis not present

## 2017-09-12 DIAGNOSIS — K5909 Other constipation: Secondary | ICD-10-CM | POA: Diagnosis not present

## 2017-09-12 DIAGNOSIS — E039 Hypothyroidism, unspecified: Secondary | ICD-10-CM | POA: Diagnosis present

## 2017-09-12 DIAGNOSIS — I4891 Unspecified atrial fibrillation: Secondary | ICD-10-CM | POA: Diagnosis present

## 2017-09-12 DIAGNOSIS — I340 Nonrheumatic mitral (valve) insufficiency: Secondary | ICD-10-CM | POA: Diagnosis present

## 2017-09-12 DIAGNOSIS — M6281 Muscle weakness (generalized): Secondary | ICD-10-CM | POA: Insufficient documentation

## 2017-09-12 DIAGNOSIS — K219 Gastro-esophageal reflux disease without esophagitis: Secondary | ICD-10-CM | POA: Diagnosis present

## 2017-09-12 DIAGNOSIS — T65891A Toxic effect of other specified substances, accidental (unintentional), initial encounter: Secondary | ICD-10-CM | POA: Insufficient documentation

## 2017-09-12 DIAGNOSIS — I10 Essential (primary) hypertension: Secondary | ICD-10-CM | POA: Diagnosis present

## 2017-09-12 DIAGNOSIS — J181 Lobar pneumonia, unspecified organism: Secondary | ICD-10-CM | POA: Diagnosis not present

## 2017-09-12 DIAGNOSIS — S22080A Wedge compression fracture of T11-T12 vertebra, initial encounter for closed fracture: Secondary | ICD-10-CM

## 2017-09-12 DIAGNOSIS — G92 Toxic encephalopathy: Secondary | ICD-10-CM | POA: Insufficient documentation

## 2017-09-12 DIAGNOSIS — R4781 Slurred speech: Secondary | ICD-10-CM | POA: Diagnosis present

## 2017-09-12 DIAGNOSIS — F411 Generalized anxiety disorder: Secondary | ICD-10-CM | POA: Diagnosis present

## 2017-09-12 DIAGNOSIS — I272 Pulmonary hypertension, unspecified: Secondary | ICD-10-CM | POA: Diagnosis present

## 2017-09-12 HISTORY — DX: Wedge compression fracture of t11-T12 vertebra, initial encounter for closed fracture: S22.080A

## 2017-09-12 LAB — URINALYSIS, ROUTINE W REFLEX MICROSCOPIC
Bacteria, UA: NONE SEEN
Bilirubin Urine: NEGATIVE
Glucose, UA: NEGATIVE mg/dL
Ketones, ur: NEGATIVE mg/dL
Leukocytes, UA: NEGATIVE
Nitrite: NEGATIVE
Protein, ur: NEGATIVE mg/dL
Specific Gravity, Urine: 1.011 (ref 1.005–1.030)
Squamous Epithelial / LPF: NONE SEEN
pH: 7 (ref 5.0–8.0)

## 2017-09-12 LAB — CBC
HCT: 40 % (ref 36.0–46.0)
Hemoglobin: 13.3 g/dL (ref 12.0–15.0)
MCH: 31.4 pg (ref 26.0–34.0)
MCHC: 33.3 g/dL (ref 30.0–36.0)
MCV: 94.6 fL (ref 78.0–100.0)
Platelets: 499 10*3/uL — ABNORMAL HIGH (ref 150–400)
RBC: 4.23 MIL/uL (ref 3.87–5.11)
RDW: 14.3 % (ref 11.5–15.5)
WBC: 9.3 10*3/uL (ref 4.0–10.5)

## 2017-09-12 LAB — COMPREHENSIVE METABOLIC PANEL
ALT: 12 U/L — ABNORMAL LOW (ref 14–54)
AST: 17 U/L (ref 15–41)
Albumin: 3.9 g/dL (ref 3.5–5.0)
Alkaline Phosphatase: 104 U/L (ref 38–126)
Anion gap: 11 (ref 5–15)
BUN: 16 mg/dL (ref 6–20)
CO2: 23 mmol/L (ref 22–32)
Calcium: 8.9 mg/dL (ref 8.9–10.3)
Chloride: 100 mmol/L — ABNORMAL LOW (ref 101–111)
Creatinine, Ser: 1.09 mg/dL — ABNORMAL HIGH (ref 0.44–1.00)
GFR calc Af Amer: 49 mL/min — ABNORMAL LOW (ref >60)
GFR calc non Af Amer: 43 mL/min — ABNORMAL LOW (ref >60)
Glucose, Bld: 142 mg/dL — ABNORMAL HIGH (ref 65–99)
Potassium: 4 mmol/L (ref 3.5–5.1)
Sodium: 134 mmol/L — ABNORMAL LOW (ref 135–145)
Total Bilirubin: 0.6 mg/dL (ref 0.3–1.2)
Total Protein: 7.4 g/dL (ref 6.5–8.1)

## 2017-09-12 LAB — PROTIME-INR
INR: 2.24
Prothrombin Time: 24.6 seconds — ABNORMAL HIGH (ref 11.4–15.2)

## 2017-09-12 LAB — RAPID URINE DRUG SCREEN, HOSP PERFORMED
Amphetamines: NOT DETECTED
Barbiturates: NOT DETECTED
Benzodiazepines: POSITIVE — AB
Cocaine: NOT DETECTED
Opiates: POSITIVE — AB
Tetrahydrocannabinol: NOT DETECTED

## 2017-09-12 LAB — DIFFERENTIAL
Basophils Absolute: 0 10*3/uL (ref 0.0–0.1)
Basophils Relative: 0 %
Eosinophils Absolute: 0.2 10*3/uL (ref 0.0–0.7)
Eosinophils Relative: 2 %
Lymphocytes Relative: 21 %
Lymphs Abs: 1.9 10*3/uL (ref 0.7–4.0)
Monocytes Absolute: 0.8 10*3/uL (ref 0.1–1.0)
Monocytes Relative: 9 %
Neutro Abs: 6.3 10*3/uL (ref 1.7–7.7)
Neutrophils Relative %: 68 %

## 2017-09-12 LAB — CBG MONITORING, ED: Glucose-Capillary: 121 mg/dL — ABNORMAL HIGH (ref 65–99)

## 2017-09-12 LAB — ETHANOL: Alcohol, Ethyl (B): <10 mg/dL (ref <10)

## 2017-09-12 LAB — BRAIN NATRIURETIC PEPTIDE: B Natriuretic Peptide: 238 pg/mL — ABNORMAL HIGH (ref 0.0–100.0)

## 2017-09-12 LAB — APTT: aPTT: 49 seconds — ABNORMAL HIGH (ref 24–36)

## 2017-09-12 LAB — TROPONIN I: Troponin I: <0.03 ng/mL (ref <0.03)

## 2017-09-12 MED ORDER — ONDANSETRON HCL 4 MG/2ML IJ SOLN
4.0000 mg | INTRAMUSCULAR | Status: DC | PRN
  Administered 2017-09-12: 4 mg via INTRAVENOUS
  Filled 2017-09-12: qty 2

## 2017-09-12 MED ORDER — FUROSEMIDE 10 MG/ML IJ SOLN
20.0000 mg | Freq: Once | INTRAMUSCULAR | Status: AC
  Administered 2017-09-12: 20 mg via INTRAVENOUS
  Filled 2017-09-12: qty 2

## 2017-09-12 MED ORDER — SODIUM CHLORIDE 0.9 % IV SOLN
INTRAVENOUS | Status: DC
  Administered 2017-09-12: 23:00:00 via INTRAVENOUS

## 2017-09-12 NOTE — ED Notes (Signed)
Lab drawn with initial IV-   Lab called to state that some labs need to be redrawn

## 2017-09-12 NOTE — ED Notes (Signed)
Pt more alert but still a bit confused States she ate chicken pie for supper and that she believes that it has made her sick  Son reports that his brother also ate chicken pie and he is well

## 2017-09-12 NOTE — ED Notes (Signed)
In CT

## 2017-09-12 NOTE — ED Provider Notes (Signed)
[facility] DEPT Provider Note   CSN: [id number] Arrival date & time: 09/12/17  2135     History   Chief Complaint Chief Complaint  Patient presents with  . Altered Mental Status    HPI Leslie Rosario is a 83 y.o. female.  The history is provided by a relative and the patient. The history is limited by the condition of the patient (AMS).  Altered Mental Status    Pt was seen at 2145.  Per pt's son: States he brought pt to the ED after he noticed pt was "confused," "not acting like herself," "slurring her speech," and "not knowing where she was." He last saw pt at her baseline at 1430 when he left the house. States he returned at 1645 and found pt with these symptoms. Pt A&O at baseline. Pt herself denies any complaints.   Past Medical History:  Diagnosis Date  . Atrial fibrillation ([place])   . Constipation   . GERD (gastroesophageal reflux disease)   . Hyperlipidemia   . Hypothyroidism   . Nausea & vomiting   . T12 compression fracture ([place]) 06/2017    Patient Active Problem List   Diagnosis Date Noted  . Essential hypertension 07/23/2016  . Hypothyroidism 03/30/2015  . Mild pulmonary hypertension ([place]) 01/16/2014  . Moderate mitral regurgitation by prior echocardiogram 01/16/2014  . Long term current use of anticoagulant therapy 02/20/2013  . Dehydration 07/18/2012  . UTI (urinary tract infection) 07/18/2012  . Constipation 02/17/2012  . Acute gastritis 12/29/2011  . GERD (gastroesophageal reflux disease)   . Atrial fibrillation ([place]) 09/12/2010  . POLYDIPSIA 09/12/2010  . DYSPHAGIA 08/22/2010  . ABDOMINAL PAIN, LEFT LOWER QUADRANT 03/05/2010  . HYPERCHOLESTEROLEMIA 03/04/2010  . HYPERLIPIDEMIA 03/04/2010  . ANXIETY 03/04/2010  . GERD 03/04/2010  . HIATAL HERNIA, HX OF 03/04/2010    Past Surgical History:  Procedure Laterality Date  . COLONOSCOPY    . NM MYOCAR PERF WALL MOTION  04/2006   dipyridamole; normal pattern of perfusion to all regions, post-stress  EF 73%  . TONSILLECTOMY    . TRANSTHORACIC ECHOCARDIOGRAM  04/2012   EF=>55%, borderline conc LVH; LA mod dilated; RA mildly dilated; mild mitral annular calcif, mod MR; mild-mod TR with elevated RSVP 30-35mmHg, mild pulm HTN; mild calcif of AV leaflets, AV mildly sclerotic; aortic root sclerosis/calcif  . UPPER GASTROINTESTINAL ENDOSCOPY      OB History    No data available       Home Medications    Prior to Admission medications   Medication Sig Start Date End Date Taking? Authorizing Provider  acetaminophen (TYLENOL) 500 MG tablet Take 500 mg by mouth every 6 (six) hours as needed.    [provider]  ALPRAZolam Duanne Moron) 0.5 MG tablet Take 0.5 mg by mouth at bedtime as needed. Patient states that she takes 1 1/2 every night to help with sleep    [provider]  alum & mag hydroxide-simeth (ANTACID ANTI-GAS MAX STRENGTH) 400-400-40 MG/5ML suspension Take 5 mLs by mouth as needed for indigestion.    [provider]  bisacodyl (DULCOLAX) 10 MG suppository Place 10 mg rectally every three (3) days as needed for moderate constipation.    [provider]  diclofenac sodium (VOLTAREN) 1 % GEL Apply topically 4 (four) times daily.    [provider]  levothyroxine (SYNTHROID, LEVOTHROID) 75 MCG tablet Take 75 mcg by mouth daily before breakfast.    [provider]  lidocaine (LIDODERM) 5 % Place 1 patch onto the  skin daily. Remove & Discard patch within 12 hours or as directed by MD    [provider]  methocarbamol (ROBAXIN) 500 MG tablet Take 500 mg by mouth 2 (two) times daily as needed for muscle spasms.    [provider]  metoprolol (TOPROL-XL) 25 MG 24 hr tablet Take 12.5 mg by mouth daily.     [provider]  omeprazole (PRILOSEC) 20 MG capsule Take 20 mg by mouth every morning.     [provider]  oxyCODONE-acetaminophen (PERCOCET) 5-325 MG tablet Take 0.5-1 tablets by mouth every 6 (six) hours  as needed for severe pain. 07/03/17   Mesner, Corene Cornea, MD  simvastatin (ZOCOR) 20 MG tablet Take 10 mg by mouth daily.     [provider]  timolol (BETIMOL) 0.5 % ophthalmic solution Place 1 drop into the left eye daily. Reported on 04/09/2016    [provider]  warfarin (COUMADIN) 3 MG tablet Take 3 mg by mouth as directed. Patient states that she is taking 3 mg daily    [provider]    Family History Family History  Problem Relation Age of Onset  . Cancer Mother   . Ovarian cancer Mother   . Cancer Father   . Cancer Brother   . Emphysema Brother   . Emphysema Brother   . Pneumonia Brother   . Aneurysm Brother   . Healthy Son   . Sleep apnea Son   . Aortic aneurysm Unknown   . Kidney cancer Son     Social History Social History  Substance Use Topics  . Smoking status: Former Smoker    Quit date: 01/10/1985  . Smokeless tobacco: Never Used  . Alcohol use No     Allergies   Adhesive [tape]; Codeine; Prednisone; Sulfonamide derivatives; and Vicodin [hydrocodone-acetaminophen]   Review of Systems Review of Systems  Unable to perform ROS: Mental status change     Physical Exam Updated Vital Signs BP (!) 202/97 (BP Location: Right Arm)   Pulse 74   Temp 98.7 F (37.1 C) (Oral)   Resp 15   SpO2 99%   Patient Vitals for the past 24 hrs:  BP Temp Temp src Pulse Resp SpO2  09/12/17 2248 (!) 191/97 - - - (!) 22 -  09/12/17 2245 (!) 200/106 - - - 16 -  09/12/17 2240 (!) 195/109 - - - 12 -  09/12/17 2207 (!) 166/94 - - 78 19 97 %  09/12/17 2203 (!) 166/94 - - 70 18 99 %  09/12/17 2148 (!) 202/97 98.7 F (37.1 C) Oral 74 15 99 %     Physical Exam 2150: Physical examination:  Nursing notes reviewed; Vital signs and O2 SAT reviewed;  Constitutional: Well developed, Well nourished, Well hydrated, In no acute distress; Head:  Normocephalic, atraumatic; Eyes: EOMI, PERRL, No scleral icterus; ENMT: Mouth and pharynx normal, Mucous membranes  moist; Neck: Supple, Full range of motion, No lymphadenopathy; Cardiovascular: Irregular rate and rhythm, No gallop; Respiratory: Breath sounds clear & equal bilaterally, No wheezes. Normal respiratory effort/excursion; Chest: Nontender, Movement normal; Abdomen: Soft, Nontender, Nondistended, Normal bowel sounds; Genitourinary: No CVA tenderness; Extremities: Pulses normal, No tenderness, No edema, No calf edema or asymmetry.; Neuro: Awake, alert, eyes open spontaneously, minimal speech (yes/no).  Major CN grossly intact. No facial droop. Grips equal. Strength 5/5 equal bilat UE's and LE's. No gross focal motor or sensory deficits in extremities. Does not appear to understand cerebellar testing.; Skin: Color normal, Warm, Dry.  ED Treatments / Results  Labs (all labs ordered are listed, but only abnormal results are displayed)   EKG  EKG Interpretation  Date/Time:  Saturday September 12 2017 21:45:47 EDT Ventricular Rate:  62 PR Interval:    QRS Duration: 92 QT Interval:  429 QTC Calculation: 436 R Axis:   5 Text Interpretation:  Atrial fibrillation Low voltage, precordial leads When compared with ECG of 09/26/2014 No significant change was found Confirmed by Francine Graven [phone]) on 09/12/2017 9:53:56 PM       Radiology   Procedures Procedures (including critical care time)  Medications Ordered in ED Medications - No data to display   Initial Impression / Assessment and Plan / ED Course  I have reviewed the triage vital signs and the nursing notes.  Pertinent labs & imaging results that were available during my care of the patient were reviewed by me and considered in my medical decision making (see chart for details).  MDM Reviewed: previous chart, nursing note and vitals Reviewed previous: labs and ECG Interpretation: labs, ECG, x-ray and CT scan Total time providing critical care: 30-74 minutes. This excludes time spent performing separately reportable procedures  and services. Consults: admitting MD and neurology   CRITICAL CARE Performed by: Alfonzo Feller Total critical care time: 35 minutes Critical care time was exclusive of separately billable procedures and treating other patients. Critical care was necessary to treat or prevent imminent or life-threatening deterioration. Critical care was time spent personally by me on the following activities: development of treatment plan with patient and/or surrogate as well as nursing, discussions with consultants, evaluation of patient's response to treatment, examination of patient, obtaining history from patient or surrogate, ordering and performing treatments and interventions, ordering and review of laboratory studies, ordering and review of radiographic studies, pulse oximetry and re-evaluation of patient's condition.  Results for orders placed or performed during the hospital encounter of 09/12/17  Ethanol  Result Value Ref Range   Alcohol, Ethyl (B) <10 <10 mg/dL  CBC  Result Value Ref Range   WBC 9.3 4.0 - 10.5 K/uL   RBC 4.23 3.87 - 5.11 MIL/uL   Hemoglobin 13.3 12.0 - 15.0 g/dL   HCT 40.0 36.0 - 46.0 %   MCV 94.6 78.0 - 100.0 fL   MCH 31.4 26.0 - 34.0 pg   MCHC 33.3 30.0 - 36.0 g/dL   RDW 14.3 11.5 - 15.5 %   Platelets 499 (H) 150 - 400 K/uL  Differential  Result Value Ref Range   Neutrophils Relative % 68 %   Neutro Abs 6.3 1.7 - 7.7 K/uL   Lymphocytes Relative 21 %   Lymphs Abs 1.9 0.7 - 4.0 K/uL   Monocytes Relative 9 %   Monocytes Absolute 0.8 0.1 - 1.0 K/uL   Eosinophils Relative 2 %   Eosinophils Absolute 0.2 0.0 - 0.7 K/uL   Basophils Relative 0 %   Basophils Absolute 0.0 0.0 - 0.1 K/uL  Urine rapid drug screen (hosp performed)not at [facility]  Result Value Ref Range   Opiates POSITIVE (A) NONE DETECTED   Cocaine NONE DETECTED NONE DETECTED   Benzodiazepines POSITIVE (A) NONE DETECTED   Amphetamines NONE DETECTED NONE DETECTED   Tetrahydrocannabinol NONE DETECTED NONE  DETECTED   Barbiturates NONE DETECTED NONE DETECTED  Urinalysis, Routine w reflex microscopic  Result Value Ref Range   Color, Urine YELLOW YELLOW   APPearance CLEAR CLEAR   Specific Gravity, Urine 1.011 1.005 - 1.030   pH 7.0 5.0 - 8.0  Glucose, UA NEGATIVE NEGATIVE mg/dL   Hgb urine dipstick SMALL (A) NEGATIVE   Bilirubin Urine NEGATIVE NEGATIVE   Ketones, ur NEGATIVE NEGATIVE mg/dL   Protein, ur NEGATIVE NEGATIVE mg/dL   Nitrite NEGATIVE NEGATIVE   Leukocytes, UA NEGATIVE NEGATIVE   RBC / HPF 0-5 0 - 5 RBC/hpf   WBC, UA 0-5 0 - 5 WBC/hpf   Bacteria, UA NONE SEEN NONE SEEN   Squamous Epithelial / LPF NONE SEEN NONE SEEN   Mucus PRESENT   Comprehensive metabolic panel  Result Value Ref Range   Sodium 134 (L) 135 - 145 mmol/L   Potassium 4.0 3.5 - 5.1 mmol/L   Chloride 100 (L) 101 - 111 mmol/L   CO2 23 22 - 32 mmol/L   Glucose, Bld 142 (H) 65 - 99 mg/dL   BUN 16 6 - 20 mg/dL   Creatinine, Ser 1.09 (H) 0.44 - 1.00 mg/dL   Calcium 8.9 8.9 - 10.3 mg/dL   Total Protein 7.4 6.5 - 8.1 g/dL   Albumin 3.9 3.5 - 5.0 g/dL   AST 17 15 - 41 U/L   ALT 12 (L) 14 - 54 U/L   Alkaline Phosphatase 104 38 - 126 U/L   Total Bilirubin 0.6 0.3 - 1.2 mg/dL   GFR calc non Af Amer 43 (L) >60 mL/min   GFR calc Af Amer 49 (L) >60 mL/min   Anion gap 11 5 - 15  Protime-INR  Result Value Ref Range   Prothrombin Time 24.6 (H) 11.4 - 15.2 seconds   INR 2.24   APTT  Result Value Ref Range   aPTT 49 (H) 24 - 36 seconds  Troponin I  Result Value Ref Range   Troponin I <0.03 <0.03 ng/mL  Brain natriuretic peptide  Result Value Ref Range   B Natriuretic Peptide 238.0 (H) 0.0 - 100.0 pg/mL  CBG monitoring, ED  Result Value Ref Range   Glucose-Capillary 121 (H) 65 - 99 mg/dL    Dg Chest 1 View Result Date: 09/12/2017 CLINICAL DATA:  Slurred speech and altered mental status.  Stroke. EXAM: CHEST 1 VIEW COMPARISON:  Radiograph 02/25/2009 FINDINGS: Stable cardiomegaly mediastinal contours with  aortic atherosclerosis. Diffuse peribronchial and interstitial thickening consistent with pulmonary edema. Small left pleural effusion suspected. No confluent airspace disease. No pneumothorax. IMPRESSION: Cardiomegaly pulmonary edema and probable small left pleural effusion, suspicious for CHF. Electronically Signed   By: Jeb Levering M.D.   On: 09/12/2017 22:54   Ct Head Code Stroke Wo Contrast Result Date: 09/12/2017 CLINICAL DATA:  Code stroke. 83 y/o F; altered mental status and slurred speech. EXAM: CT HEAD WITHOUT CONTRAST TECHNIQUE: Contiguous axial images were obtained from the base of the skull through the vertex without intravenous contrast. COMPARISON:  07/18/2012 CT head FINDINGS: Brain: No evidence of acute infarction, hemorrhage, hydrocephalus, extra-axial collection or mass lesion/mass effect. Mild chronic microvascular ischemic changes parenchymal volume loss of the brain are stable. Vascular: Moderate calcific atherosclerosis.  No hyperdense vessel. Skull: Normal. Negative for fracture or focal lesion. Sinuses/Orbits: Right maxillary and sphenoid sinus mucosal thickening. Normal aeration of mastoid air cells. Orbits are unremarkable. Other: None. ASPECTS [facility] Stroke Program Early CT Score) - Ganglionic level infarction (caudate, lentiform nuclei, internal capsule, insula, M1-M3 cortex): 7 - Supraganglionic infarction (M4-M6 cortex): 3 Total score (0-10 with 10 being normal): 10 IMPRESSION: 1. No acute intracranial abnormality identified. 2. Stable mild for age chronic microvascular ischemic changes and parenchymal volume loss of the brain. 3. ASPECTS is 10 These  results were called by telephone at the time of interpretation on 09/12/2017 at 10:06 pm to Dr. Francine Graven , who verbally acknowledged these results. Electronically Signed   By: Kristine Garbe M.D.   On: 09/12/2017 22:07     2210:  Code Stroke called on arrival to ED. [facility] has evaluated pt: states pt not  TPA candidate, symptoms appear more c/w encephalopathy than stroke, admit for further workup.   2320:  Pt becoming more awake/alert while in the ED, though still confused. BNP elevated with CHF on CXR; low dose lasix given.  T/C to [facility] Dr. Shanon Brow, case discussed, including:  HPI, pertinent PM/SHx, VS/PE, dx testing, ED course and treatment:  Agreeable to admit.     Final Clinical Impressions(s) / ED Diagnoses   Final diagnoses:  None    New Prescriptions New Prescriptions   No medications on file      Francine Graven, DO 09/16/17 1138

## 2017-09-12 NOTE — ED Triage Notes (Signed)
Pt lives with her Son who brings pt in tonight after he noticed pt was confused and not acting herself, slurring of speech, did not know where she was.   Son states he last saw the patient normal approx 1430 when he left the house.  When he returned the patient was having the symptoms stated above.  LKW 1430 today

## 2017-09-12 NOTE — ED Notes (Signed)
Report to Michael RN

## 2017-09-12 NOTE — ED Notes (Signed)
CXR

## 2017-09-12 NOTE — ED Notes (Signed)
Tele neurology being performed

## 2017-09-12 NOTE — Progress Notes (Signed)
ER Call at 2149, beeper @ [phone], exam started 2156 Exam finished 2157 Soc sent and exammp pcompleted 2158 Radiologist called 2159

## 2017-09-12 NOTE — ED Notes (Signed)
Dr Thurnell Garbe at bedside

## 2017-09-12 NOTE — ED Notes (Addendum)
Patient transported to CT 

## 2017-09-12 NOTE — ED Notes (Signed)
Code Stroke paged @ 2149. [facility] called @ 2152

## 2017-09-12 NOTE — ED Notes (Signed)
I &O cath performed, witnessed by Fritzi Mandes

## 2017-09-13 ENCOUNTER — Observation Stay (HOSPITAL_COMMUNITY): Payer: Medicare Other

## 2017-09-13 ENCOUNTER — Observation Stay (HOSPITAL_BASED_OUTPATIENT_CLINIC_OR_DEPARTMENT_OTHER): Payer: Medicare Other

## 2017-09-13 ENCOUNTER — Encounter (HOSPITAL_COMMUNITY): Payer: Self-pay | Admitting: *Deleted

## 2017-09-13 DIAGNOSIS — F1110 Opioid abuse, uncomplicated: Secondary | ICD-10-CM | POA: Diagnosis not present

## 2017-09-13 DIAGNOSIS — R4789 Other speech disturbances: Secondary | ICD-10-CM

## 2017-09-13 DIAGNOSIS — G9340 Encephalopathy, unspecified: Secondary | ICD-10-CM

## 2017-09-13 DIAGNOSIS — F1190 Opioid use, unspecified, uncomplicated: Secondary | ICD-10-CM | POA: Diagnosis not present

## 2017-09-13 DIAGNOSIS — R4781 Slurred speech: Secondary | ICD-10-CM

## 2017-09-13 DIAGNOSIS — R4182 Altered mental status, unspecified: Secondary | ICD-10-CM

## 2017-09-13 DIAGNOSIS — I6523 Occlusion and stenosis of bilateral carotid arteries: Secondary | ICD-10-CM | POA: Diagnosis not present

## 2017-09-13 DIAGNOSIS — I480 Paroxysmal atrial fibrillation: Secondary | ICD-10-CM

## 2017-09-13 DIAGNOSIS — Z79899 Other long term (current) drug therapy: Secondary | ICD-10-CM

## 2017-09-13 DIAGNOSIS — I10 Essential (primary) hypertension: Secondary | ICD-10-CM

## 2017-09-13 DIAGNOSIS — R9431 Abnormal electrocardiogram [ECG] [EKG]: Secondary | ICD-10-CM | POA: Diagnosis not present

## 2017-09-13 DIAGNOSIS — E038 Other specified hypothyroidism: Secondary | ICD-10-CM

## 2017-09-13 DIAGNOSIS — I482 Chronic atrial fibrillation: Secondary | ICD-10-CM

## 2017-09-13 DIAGNOSIS — S22080A Wedge compression fracture of T11-T12 vertebra, initial encounter for closed fracture: Secondary | ICD-10-CM

## 2017-09-13 LAB — ECHOCARDIOGRAM COMPLETE
E decel time: 190 msec
FS: 41 % (ref 28–44)
Height: 64 in
IVS/LV PW RATIO, ED: 0.78
LA ID, A-P, ES: 46 mm
LA diam end sys: 46 mm
LA diam index: 2.77 cm/m2
LA vol A4C: 56.6 ml
LA vol index: 33.3 mL/m2
LA vol: 55.3 mL
LV PW d: 10 mm — AB (ref 0.6–1.1)
LV dias vol index: 23 mL/m2
LV dias vol: 38 mL — AB (ref 46–106)
LV sys vol index: 8 mL/m2
LV sys vol: 13 mL — AB
LVOT SV: 40 mL
LVOT VTI: 17.7 cm
LVOT area: 2.27 cm2
LVOT diameter: 17 mm
LVOT peak grad rest: 3 mmHg
LVOT peak vel: 83 cm/s
MV Dec: 190
MV Peak grad: 3 mmHg
MV pk A vel: 29.5 m/s
MV pk E vel: 89.5 m/s
RV sys press: 61 mmHg
Reg peak vel: 364 cm/s
Simpson's disk: 67
Stroke v: 26 ml
TAPSE: 16.9 mm
TR max vel: 364 cm/s
Weight: 2144.63 oz

## 2017-09-13 LAB — HEMOGLOBIN A1C
Hgb A1c MFr Bld: 5.7 % — ABNORMAL HIGH (ref 4.8–5.6)
Mean Plasma Glucose: 116.89 mg/dL

## 2017-09-13 LAB — LIPID PANEL
Cholesterol: 167 mg/dL (ref 0–200)
HDL: 42 mg/dL (ref >40)
LDL Cholesterol: 97 mg/dL (ref 0–99)
Total CHOL/HDL Ratio: 4 RATIO
Triglycerides: 140 mg/dL (ref <150)
VLDL: 28 mg/dL (ref 0–40)

## 2017-09-13 LAB — AMMONIA: Ammonia: <9 umol/L — ABNORMAL LOW (ref 9–35)

## 2017-09-13 LAB — TSH: TSH: 1.054 u[IU]/mL (ref 0.350–4.500)

## 2017-09-13 LAB — VITAMIN B12: Vitamin B-12: 346 pg/mL (ref 180–914)

## 2017-09-13 MED ORDER — METOPROLOL SUCCINATE ER 25 MG PO TB24
12.5000 mg | ORAL_TABLET | Freq: Every day | ORAL | Status: DC
  Administered 2017-09-13 – 2017-09-14 (×2): 12.5 mg via ORAL
  Filled 2017-09-13 (×2): qty 1

## 2017-09-13 MED ORDER — ASPIRIN 300 MG RE SUPP: 300.0000 mg | Freq: Every day | RECTAL | Status: DC

## 2017-09-13 MED ORDER — HYDROCODONE-ACETAMINOPHEN 5-325 MG PO TABS
1.0000 | ORAL_TABLET | Freq: Four times a day (QID) | ORAL | Status: DC | PRN
  Administered 2017-09-13 (×2): 1 via ORAL
  Filled 2017-09-13 (×2): qty 1

## 2017-09-13 MED ORDER — ONDANSETRON HCL 4 MG/2ML IJ SOLN
4.0000 mg | Freq: Four times a day (QID) | INTRAMUSCULAR | Status: DC | PRN
  Administered 2017-09-13 – 2017-09-14 (×2): 4 mg via INTRAVENOUS
  Filled 2017-09-13 (×3): qty 2

## 2017-09-13 MED ORDER — ASPIRIN 325 MG PO TABS
325.0000 mg | ORAL_TABLET | Freq: Every day | ORAL | Status: DC
  Administered 2017-09-14: 325 mg via ORAL
  Filled 2017-09-13 (×2): qty 1

## 2017-09-13 MED ORDER — DICLOFENAC SODIUM 1 % TD GEL
2.0000 g | Freq: Four times a day (QID) | TRANSDERMAL | Status: DC
  Administered 2017-09-13 – 2017-09-14 (×3): 2 g via TOPICAL
  Filled 2017-09-13: qty 100

## 2017-09-13 MED ORDER — ACETAMINOPHEN 650 MG RE SUPP: 650.0000 mg | RECTAL | Status: DC | PRN

## 2017-09-13 MED ORDER — LIDOCAINE 5 % EX PTCH
1.0000 | MEDICATED_PATCH | CUTANEOUS | Status: DC
  Administered 2017-09-13 – 2017-09-14 (×2): 1 via TRANSDERMAL
  Filled 2017-09-13 (×2): qty 1

## 2017-09-13 MED ORDER — LEVOTHYROXINE SODIUM 75 MCG PO TABS
75.0000 ug | ORAL_TABLET | Freq: Every day | ORAL | Status: DC
  Administered 2017-09-13 – 2017-09-14 (×2): 75 ug via ORAL
  Filled 2017-09-13 (×2): qty 1

## 2017-09-13 MED ORDER — ACETAMINOPHEN 160 MG/5ML PO SOLN: 650.0000 mg | ORAL | Status: DC | PRN

## 2017-09-13 MED ORDER — WARFARIN SODIUM 2 MG PO TABS
3.0000 mg | ORAL_TABLET | Freq: Every day | ORAL | Status: DC
  Administered 2017-09-13: 3 mg via ORAL
  Filled 2017-09-13: qty 1

## 2017-09-13 MED ORDER — SIMVASTATIN 10 MG PO TABS
10.0000 mg | ORAL_TABLET | Freq: Every day | ORAL | Status: DC
  Administered 2017-09-13: 10 mg via ORAL
  Filled 2017-09-13: qty 1

## 2017-09-13 MED ORDER — ACETAMINOPHEN 325 MG PO TABS: 650.0000 mg | ORAL_TABLET | ORAL | Status: DC | PRN

## 2017-09-13 MED ORDER — WARFARIN - PHYSICIAN DOSING INPATIENT
Freq: Every day | Status: DC
  Administered 2017-09-13: 18:00:00

## 2017-09-13 MED ORDER — STROKE: EARLY STAGES OF RECOVERY BOOK
Freq: Once | Status: AC
  Administered 2017-09-13: 17:00:00
  Filled 2017-09-13: qty 1

## 2017-09-13 NOTE — Progress Notes (Signed)
PROGRESS NOTE  Leslie Rosario YWV:[id number] DOB: 01/30/1923 DOA: 09/12/2017 PCP: Celene Squibb, MD  Brief History:  83 year old female with a history of permanent atrial fibrillation, hyperlipidemia, hypothyroidism, T12 compression fracture, and GERD presenting with slurred speech and confusion. The patient's son stated that she was "normal" on the morning of 09/12/2017. However after he returned home from running some errands he noted the patient to be confused and have slurred speech. As a result, the patient was put in hospital for further evaluation. At baseline, the patient's son states that she is alert and oriented 3, and she is able to perform all her activities of daily living without assistance. Addition, the patient administers her own medications without difficulty according to the son. The patient is supposed to ambulate with a cane, but she often does not use it. The patient's son states that the patient has been in her usual state of health without any complaints this past week.  In the emergency department, the patient's slurred speech improved.  Teleneurology was consulted and did not feel patient's symptoms were consistent with stroke, and she was not a tPA candidate due to warfarin.  CT brain was negative for acute findings.  Assessment/Plan: Acute encephalopathy -Etiology unclear at this time, workup in progress -Suspect opiates and benzodiazepines may be playing a role -Serum B12 -TSH -RPR -ammonia -MRI brain -Urinalysis is negative for pyuria -09/13/17--pt remains confused -personally reviewed EKG--sinus, nonspecific Twave changes -personally reviewed CXR--increase interstitial changes, no consolidation  Slurred speech -Improved -09/12/2018 CT brain--negative -MRI brain -LDL -HbA1C -Echo -continue ASA  Permanent atrial fibrillation -Continue warfarin -Rate controlled -Continue metoprolol succinate  Essential hypertension -continue metoprolol  succinate  T12 compression fracture -Noted on CT abdomen 07/03/2009 -Continue judicious opioids for now -PT evaluation -Hold gabapentin, Robaxin, alprazolam  Hyperlipidemia -Continue statin  Hypothyroidism -Continue Synthroid  Chronic constipation -Continue cathartics   Disposition Plan:   Home in 1-2 days  Family Communication:   Son updated on phone 9/30--Total time spent 35 minutes.  Greater than 50% spent face to face counseling and coordinating care.0755 to 0830   Consultants:  none  Code Status:  FULL   DVT Prophylaxis: warfarin   Procedures: As Listed in Progress Note Above  Antibiotics: None    Subjective: Patient is mostly confused. She denies any fevers, chills, headache, chest pain, suspect, coughing, vomiting, abdominal pain, diarrhea, dysuria.  Objective: Vitals:   09/13/17 0120 09/13/17 0320 09/13/17 0520 09/13/17 0720  BP: (!) 180/78 (!) 163/75 (!) 150/80 (!) 145/70  Pulse: 86 73 69 68  Resp: 18 18 16 16   Temp: 98.2 F (36.8 C) 97.8 F (36.6 C) 97.7 F (36.5 C) 98.6 F (37 C)  TempSrc: Oral Oral Oral Oral  SpO2: 95% 98% 98% 98%  Weight: 60.8 kg (134 lb 0.6 oz)     Height: 5\' 4"  (1.626 m)       Intake/Output Summary (Last 24 hours) at 09/13/17 0805 Last data filed at 09/13/17 0538  Gross per 24 hour  Intake                0 ml  Output             1608 ml  Net            -1608 ml   Weight change:  Exam:   General:  Pt is alert, follows commands appropriately, not in acute distress, pleasantly confused  HEENT: No icterus,  No thrush, No neck mass, [place]/AT  Cardiovascular: RRR, S1/S2, no rubs, no gallops  Respiratory: CTA bilaterally, no wheezing, no crackles, no rhonchi  Abdomen: Soft/+BS, non tender, non distended, no guarding  Extremities: No edema, No lymphangitis, No petechiae, No rashes, no synovitis   Data Reviewed: I have personally reviewed following labs and imaging studies Basic Metabolic Panel:  Recent Labs Lab  09/12/17 2235  NA 134*  K 4.0  CL 100*  CO2 23  GLUCOSE 142*  BUN 16  CREATININE 1.09*  CALCIUM 8.9   Liver Function Tests:  Recent Labs Lab 09/12/17 2235  AST 17  ALT 12*  ALKPHOS 104  BILITOT 0.6  PROT 7.4  ALBUMIN 3.9   No results for input(s): LIPASE, AMYLASE in the last 168 hours. No results for input(s): AMMONIA in the last 168 hours. Coagulation Profile:  Recent Labs Lab 09/12/17 2235  INR 2.24   CBC:  Recent Labs Lab 09/12/17 2150  WBC 9.3  NEUTROABS 6.3  HGB 13.3  HCT 40.0  MCV 94.6  PLT 499*   Cardiac Enzymes:  Recent Labs Lab 09/12/17 2235  TROPONINI <0.03   BNP: Invalid input(s): POCBNP CBG:  Recent Labs Lab 09/12/17 2205  GLUCAP 121*   HbA1C: No results for input(s): HGBA1C in the last 72 hours. Urine analysis:    Component Value Date/Time   COLORURINE YELLOW 09/12/2017 2221   APPEARANCEUR CLEAR 09/12/2017 2221   LABSPEC 1.011 09/12/2017 2221   PHURINE 7.0 09/12/2017 2221   GLUCOSEU NEGATIVE 09/12/2017 2221   HGBUR SMALL (A) 09/12/2017 2221   BILIRUBINUR NEGATIVE 09/12/2017 2221   KETONESUR NEGATIVE 09/12/2017 2221   PROTEINUR NEGATIVE 09/12/2017 2221   UROBILINOGEN 0.2 09/26/2014 2240   NITRITE NEGATIVE 09/12/2017 2221   LEUKOCYTESUR NEGATIVE 09/12/2017 2221   Sepsis Labs: @LABRCNTIP (procalcitonin:4,lacticidven:4) )No results found for this or any previous visit (from the past 240 hour(s)).   Scheduled Meds: .  stroke: mapping our early stages of recovery book   Does not apply Once  . aspirin  300 mg Rectal Daily   Or  . aspirin  325 mg Oral Daily  . lidocaine  1 patch Transdermal Q24H  . warfarin  3 mg Oral q1800  . Warfarin - Physician Dosing Inpatient   Does not apply q1800   Continuous Infusions:  Procedures/Studies: Dg Chest 1 View  Result Date: 09/12/2017 CLINICAL DATA:  Slurred speech and altered mental status.  Stroke. EXAM: CHEST 1 VIEW COMPARISON:  Radiograph 02/25/2009 FINDINGS: Stable  cardiomegaly mediastinal contours with aortic atherosclerosis. Diffuse peribronchial and interstitial thickening consistent with pulmonary edema. Small left pleural effusion suspected. No confluent airspace disease. No pneumothorax. IMPRESSION: Cardiomegaly pulmonary edema and probable small left pleural effusion, suspicious for CHF. Electronically Signed   By: Jeb Levering M.D.   On: 09/12/2017 22:54   Ct Head Code Stroke Wo Contrast  Result Date: 09/12/2017 CLINICAL DATA:  Code stroke. 83 y/o F; altered mental status and slurred speech. EXAM: CT HEAD WITHOUT CONTRAST TECHNIQUE: Contiguous axial images were obtained from the base of the skull through the vertex without intravenous contrast. COMPARISON:  07/18/2012 CT head FINDINGS: Brain: No evidence of acute infarction, hemorrhage, hydrocephalus, extra-axial collection or mass lesion/mass effect. Mild chronic microvascular ischemic changes parenchymal volume loss of the brain are stable. Vascular: Moderate calcific atherosclerosis.  No hyperdense vessel. Skull: Normal. Negative for fracture or focal lesion. Sinuses/Orbits: Right maxillary and sphenoid sinus mucosal thickening. Normal aeration of mastoid air cells. Orbits are unremarkable. Other: None. ASPECTS [facility]  Stroke Program Early CT Score) - Ganglionic level infarction (caudate, lentiform nuclei, internal capsule, insula, M1-M3 cortex): 7 - Supraganglionic infarction (M4-M6 cortex): 3 Total score (0-10 with 10 being normal): 10 IMPRESSION: 1. No acute intracranial abnormality identified. 2. Stable mild for age chronic microvascular ischemic changes and parenchymal volume loss of the brain. 3. ASPECTS is 10 These results were called by telephone at the time of interpretation on 09/12/2017 at 10:06 pm to Dr. Francine Graven , who verbally acknowledged these results. Electronically Signed   By: Kristine Garbe M.D.   On: 09/12/2017 22:07    Haylynn Pha, DO  [facility] Pager  [phone]  If 7PM-7AM, please contact night-coverage [email or web address] Password [email or web address] 09/13/2017, 8:05 AM   LOS: 0 days

## 2017-09-13 NOTE — ED Notes (Signed)
Purewick placed on pt. 

## 2017-09-13 NOTE — Evaluation (Signed)
Physical Therapy Evaluation Patient Details Name: Leslie Rosario MRN: [id number] DOB: September 17, 1923 Today's Date: 09/13/2017   History of Present Illness  Leslie Rosario is a 83 y.o. female with medical history significant of afib, hld , chronic back pain comes in after son found her earlier today confused with slurred speech.  pts mental status has seem ed to improve.  Code stroke was initiated in ED, teleneuro evaluated pt.  She only complains of her back hurting and looks uncomfortable as we have given her no pain meds all day.  She seems pleasantly confused, can follow simple commands, but again is complaining of her back hurting.  Pt referred for admission for possible stroke.  She is on coumadin and not a tpa candidate per tele neuro.    Clinical Impression  Patient limited for functional mobility as stated below secondary to BLE weakness, fatigue and poor standing balance.  Patient will benefit from continued physical therapy in hospital and recommended venue below to increase strength, balance, endurance for safe ADLs and gait.    Follow Up Recommendations Home health PT;Supervision/Assistance - 24 hour    Equipment Recommendations  Rolling walker with 5" wheels    Recommendations for Other Services       Precautions / Restrictions Precautions Precautions: Fall Restrictions Weight Bearing Restrictions: No      Mobility  Bed Mobility Overal bed mobility: Needs Assistance Bed Mobility: Supine to Sit;Sit to Supine     Supine to sit: Supervision Sit to supine: Supervision      Transfers Overall transfer level: Needs assistance Equipment used: 1 person hand held assist Transfers: Sit to/from Stand Sit to Stand: Min guard            Ambulation/Gait Ambulation/Gait assistance: Min guard Ambulation Distance (Feet): 30 Feet Assistive device: 1 person hand held assist Gait Pattern/deviations: Decreased step length - right;Decreased step length - left;Decreased stride  length;Wide base of support   Gait velocity interpretation: Below normal speed for age/gender General Gait Details: very unsteady slow labored cadence without loss of balance, limited secondary to c/o nausea  Stairs            Wheelchair Mobility    Modified Rankin (Stroke Patients Only)       Balance Overall balance assessment: Needs assistance Sitting-balance support: No upper extremity supported;Feet supported Sitting balance-Leahy Scale: Good     Standing balance support: No upper extremity supported;During functional activity Standing balance-Leahy Scale: Fair                               Pertinent Vitals/Pain Pain Assessment: Faces Pain Score: 2  Pain Location: low back Pain Descriptors / Indicators: Discomfort Pain Intervention(s): Limited activity within patient's tolerance;Monitored during session    Home Living Family/patient expects to be discharged to:: Private residence   Available Help at Discharge: Family Type of Home: House Home Access: Stairs to enter Entrance Stairs-Rails: Left Entrance Stairs-Number of Steps: 3 Home Layout: One level Home Equipment: Cane - single point      Prior Function Level of Independence: Independent               Hand Dominance        Extremity/Trunk Assessment   Upper Extremity Assessment Upper Extremity Assessment: Generalized weakness    Lower Extremity Assessment Lower Extremity Assessment: Generalized weakness    Cervical / Trunk Assessment Cervical / Trunk Assessment: Normal  Communication   Communication: No  difficulties  Cognition Arousal/Alertness: Awake/alert Behavior During Therapy: WFL for tasks assessed/performed Overall Cognitive Status: Impaired/Different from baseline Area of Impairment: Orientation                 Orientation Level: Person             General Comments: able to participate with therapy appropriately      General Comments       Exercises     Assessment/Plan    PT Assessment Patient needs continued PT services  PT Problem List Decreased strength;Decreased activity tolerance;Decreased balance;Decreased mobility       PT Treatment Interventions Gait training;Functional mobility training;Therapeutic activities;Therapeutic exercise;Patient/family education    PT Goals (Current goals can be found in the Care Plan section)  Acute Rehab PT Goals Patient Stated Goal: return home with son PT Goal Formulation: With patient/family Time For Goal Achievement: 09/16/17 Potential to Achieve Goals: Good    Frequency Min 3X/week   Barriers to discharge        Co-evaluation               AM-PAC PT "6 Clicks" Daily Activity  Outcome Measure Difficulty turning over in bed (including adjusting bedclothes, sheets and blankets)?: None Difficulty moving from lying on back to sitting on the side of the bed? : None Difficulty sitting down on and standing up from a chair with arms (e.g., wheelchair, bedside commode, etc,.)?: None Help needed moving to and from a bed to chair (including a wheelchair)?: A Little Help needed walking in hospital room?: A Little Help needed climbing 3-5 steps with a railing? : A Little 6 Click Score: 21    End of Session Equipment Utilized During Treatment: Gait belt Activity Tolerance: Patient limited by fatigue Patient left: in bed;with call bell/phone within reach;with bed alarm set Nurse Communication: Mobility status PT Visit Diagnosis: Unsteadiness on feet (R26.81);Other abnormalities of gait and mobility (R26.89);Muscle weakness (generalized) (M62.81)    Time: 6237-6283 PT Time Calculation (min) (ACUTE ONLY): 30 min   Charges:   PT Evaluation $PT Eval Low Complexity: 1 Low PT Treatments $Therapeutic Activity: 23-37 mins   PT G Codes:   PT G-Codes **NOT FOR INPATIENT CLASS** Functional Assessment Tool Used: AM-PAC 6 Clicks Basic Mobility Functional Limitation: Mobility:  Walking and moving around Mobility: Walking and Moving Around Current Status (T5176): At least 20 percent but less than 40 percent impaired, limited or restricted Mobility: Walking and Moving Around Goal Status [phone]): At least 20 percent but less than 40 percent impaired, limited or restricted    9:49 AM, 09/13/17 Lonell Grandchild, MPT Physical Therapist with [facility] [phone] office [phone] mobile phone

## 2017-09-13 NOTE — Progress Notes (Signed)
During assessment and administering morning meds to patient, patient was really confused about taking her meds.  She did not want to take my word for what the medications were.  She is insisted on breaking some in half and would only take half of the pain meds and Synthroid medication.  Patient very confused about what she takes at home and was extremely uncomfortable trusting someone else with her medications.

## 2017-09-13 NOTE — Progress Notes (Signed)
*  PRELIMINARY RESULTS* Echocardiogram 2D Echocardiogram has been performed.  Leslie Rosario 09/13/2017, 12:20 PM

## 2017-09-13 NOTE — H&P (Signed)
History and Physical    Leslie Rosario EPP:[id number] DOB: September 10, 1923 DOA: 09/12/2017  PCP: Celene Squibb, MD  Patient coming from:  home  Chief Complaint:  Slurred speech, confusion  HPI: Leslie Rosario is a 83 y.o. female with medical history significant of afib, hld , chronic back pain comes in after son found her earlier today confused with slurred speech.  pts mental status has seem ed to improve.  Code stroke was initiated in ED, teleneuro evaluated pt.  She only complains of her back hurting and looks uncomfortable as we have given her no pain meds all day.  She seems pleasantly confused, can follow simple commands, but again is complaining of her back hurting.  Pt referred for admission for possible stroke.  She is on coumadin and not a tpa candidate per tele neuro.   Review of Systems: As per HPI otherwise 10 point review of systems negative but pt mildly confused  Past Medical History:  Diagnosis Date  . Atrial fibrillation ([place])   . Constipation   . GERD (gastroesophageal reflux disease)   . Hyperlipidemia   . Hypothyroidism   . Nausea & vomiting   . T12 compression fracture ([place]) 06/2017    Past Surgical History:  Procedure Laterality Date  . COLONOSCOPY    . NM MYOCAR PERF WALL MOTION  04/2006   dipyridamole; normal pattern of perfusion to all regions, post-stress EF 73%  . TONSILLECTOMY    . TRANSTHORACIC ECHOCARDIOGRAM  04/2012   EF=>55%, borderline conc LVH; LA mod dilated; RA mildly dilated; mild mitral annular calcif, mod MR; mild-mod TR with elevated RSVP 30-61mmHg, mild pulm HTN; mild calcif of AV leaflets, AV mildly sclerotic; aortic root sclerosis/calcif  . UPPER GASTROINTESTINAL ENDOSCOPY       reports that she quit smoking about 32 years ago. She has never used smokeless tobacco. She reports that she does not drink alcohol or use drugs.  Allergies  Allergen Reactions  . Adhesive [Tape] Other (See Comments)    REACTION: pulls skin  . Codeine     REACTION:  UNKNOWN REACTION  . Prednisone Other (See Comments)    REACTION: UNKNOWN  . Sulfonamide Derivatives     REACTION: UNKNOWN REACTION  . Vicodin [Hydrocodone-Acetaminophen]     Family History  Problem Relation Age of Onset  . Cancer Mother   . Ovarian cancer Mother   . Cancer Father   . Cancer Brother   . Emphysema Brother   . Emphysema Brother   . Pneumonia Brother   . Aneurysm Brother   . Healthy Son   . Sleep apnea Son   . Aortic aneurysm Unknown   . Kidney cancer Son     Prior to Admission medications   Medication Sig Start Date End Date Taking? Authorizing Provider  ALPRAZolam Duanne Moron) 0.5 MG tablet Take 0.5 mg by mouth at bedtime as needed. Patient states that she takes 1 1/2 every night to help with sleep   Yes [provider]  diclofenac sodium (VOLTAREN) 1 % GEL Apply topically 4 (four) times daily.   Yes [provider]  HYDROcodone-acetaminophen (NORCO/VICODIN) 5-325 MG tablet Take 1 tablet by mouth daily as needed for moderate pain or severe pain.  08/20/17  Yes [provider]  levothyroxine (SYNTHROID, LEVOTHROID) 75 MCG tablet Take 75 mcg by mouth daily before breakfast.   Yes [provider]  warfarin (COUMADIN) 3 MG tablet Take 3 mg by mouth as directed. Patient states that she is taking  3 mg daily   Yes [provider]  acetaminophen (TYLENOL) 500 MG tablet Take 500 mg by mouth every 6 (six) hours as needed.    [provider]  alum & mag hydroxide-simeth (ANTACID ANTI-GAS MAX STRENGTH) 400-400-40 MG/5ML suspension Take 5 mLs by mouth as needed for indigestion.    [provider]  bisacodyl (DULCOLAX) 10 MG suppository Place 10 mg rectally every three (3) days as needed for moderate constipation.    [provider]  gabapentin (NEURONTIN) 100 MG capsule Take 100 mg by mouth daily. 08/26/17   [provider]  lidocaine (LIDODERM) 5 % Place 1 patch onto the skin daily. Remove & Discard patch  within 12 hours or as directed by MD    [provider]  methocarbamol (ROBAXIN) 500 MG tablet Take 500 mg by mouth 2 (two) times daily as needed for muscle spasms.    [provider]  metoprolol (TOPROL-XL) 25 MG 24 hr tablet Take 12.5 mg by mouth daily.     [provider]  omeprazole (PRILOSEC) 20 MG capsule Take 20 mg by mouth every morning.     [provider]  oxyCODONE-acetaminophen (PERCOCET) 5-325 MG tablet Take 0.5-1 tablets by mouth every 6 (six) hours as needed for severe pain. Patient not taking: Reported on 09/12/2017 07/03/17   Mesner, Corene Cornea, MD  simvastatin (ZOCOR) 20 MG tablet Take 10 mg by mouth daily.     [provider]  timolol (BETIMOL) 0.5 % ophthalmic solution Place 1 drop into the left eye daily. Reported on 04/09/2016    [provider]    Physical Exam: Vitals:   09/12/17 2245 09/12/17 2248 09/12/17 2345 09/13/17 0000  BP: (!) 200/106 (!) 191/97 (!) 187/92 (!) 187/91  Pulse:   87 79  Resp: 16 (!) 22 16 15   Temp:      TempSrc:      SpO2:   95% 94%    Constitutional: NAD, calm, comfortable Vitals:   09/12/17 2245 09/12/17 2248 09/12/17 2345 09/13/17 0000  BP: (!) 200/106 (!) 191/97 (!) 187/92 (!) 187/91  Pulse:   87 79  Resp: 16 (!) 22 16 15   Temp:      TempSrc:      SpO2:   95% 94%   Eyes: PERRL, lids and conjunctivae normal ENMT: Mucous membranes are moist. Posterior pharynx clear of any exudate or lesions.Normal dentition.  Neck: normal, supple, no masses, no thyromegaly Respiratory: clear to auscultation bilaterally, no wheezing, no crackles. Normal respiratory effort. No accessory muscle use.  Cardiovascular: Regular rate and rhythm, no murmurs / rubs / gallops. No extremity edema. 2+ pedal pulses. No carotid bruits.  Abdomen: no tenderness, no masses palpated. No hepatosplenomegaly. Bowel sounds positive.  Musculoskeletal: no clubbing / cyanosis. No joint deformity upper and lower extremities. Good  ROM, no contractures. Normal muscle tone.  Skin: no rashes, lesions, ulcers. No induration Neurologic: CN 2-12 grossly intact. Sensation intact, DTR normal. Strength 5/5 in all 4.  Psychiatric: Normal judgment and insight. Alert and oriented x 3. Normal mood.    Labs on Admission: I have personally reviewed following labs and imaging studies  CBC:  Recent Labs Lab 09/12/17 2150  WBC 9.3  NEUTROABS 6.3  HGB 13.3  HCT 40.0  MCV 94.6  PLT 500*   Basic Metabolic Panel:  Recent Labs Lab 09/12/17 2235  NA 134*  K 4.0  CL 100*  CO2 23  GLUCOSE 142*  BUN 16  CREATININE 1.09*  CALCIUM 8.9   GFR: CrCl cannot be calculated (Unknown ideal weight.). Liver Function Tests:  Recent Labs Lab 09/12/17 2235  AST 17  ALT 12*  ALKPHOS 104  BILITOT 0.6  PROT 7.4  ALBUMIN 3.9   Coagulation Profile:  Recent Labs Lab 09/12/17 2235  INR 2.24   Cardiac Enzymes:  Recent Labs Lab 09/12/17 2235  TROPONINI <0.03   CBG:  Recent Labs Lab 09/12/17 2205  GLUCAP 121*   Urine analysis:    Component Value Date/Time   COLORURINE YELLOW 09/12/2017 [place] 09/12/2017 2221   LABSPEC 1.011 09/12/2017 2221   PHURINE 7.0 09/12/2017 2221   GLUCOSEU NEGATIVE 09/12/2017 2221   HGBUR SMALL (A) 09/12/2017 2221   BILIRUBINUR NEGATIVE 09/12/2017 2221   KETONESUR NEGATIVE 09/12/2017 2221   PROTEINUR NEGATIVE 09/12/2017 2221   UROBILINOGEN 0.2 09/26/2014 2240   NITRITE NEGATIVE 09/12/2017 2221   LEUKOCYTESUR NEGATIVE 09/12/2017 2221   Radiological Exams on Admission: Dg Chest 1 View  Result Date: 09/12/2017 CLINICAL DATA:  Slurred speech and altered mental status.  Stroke. EXAM: CHEST 1 VIEW COMPARISON:  Radiograph 02/25/2009 FINDINGS: Stable cardiomegaly mediastinal contours with aortic atherosclerosis. Diffuse peribronchial and interstitial thickening consistent with pulmonary edema. Small left pleural effusion suspected. No confluent airspace disease. No  pneumothorax. IMPRESSION: Cardiomegaly pulmonary edema and probable small left pleural effusion, suspicious for CHF. Electronically Signed   By: Jeb Levering M.D.   On: 09/12/2017 22:54   Ct Head Code Stroke Wo Contrast  Result Date: 09/12/2017 CLINICAL DATA:  Code stroke. 83 y/o F; altered mental status and slurred speech. EXAM: CT HEAD WITHOUT CONTRAST TECHNIQUE: Contiguous axial images were obtained from the base of the skull through the vertex without intravenous contrast. COMPARISON:  07/18/2012 CT head FINDINGS: Brain: No evidence of acute infarction, hemorrhage, hydrocephalus, extra-axial collection or mass lesion/mass effect. Mild chronic microvascular ischemic changes parenchymal volume loss of the brain are stable. Vascular: Moderate calcific atherosclerosis.  No hyperdense vessel. Skull: Normal. Negative for fracture or focal lesion. Sinuses/Orbits: Right maxillary and sphenoid sinus mucosal thickening. Normal aeration of mastoid air cells. Orbits are unremarkable. Other: None. ASPECTS [facility] Stroke Program Early CT Score) - Ganglionic level infarction (caudate, lentiform nuclei, internal capsule, insula, M1-M3 cortex): 7 - Supraganglionic infarction (M4-M6 cortex): 3 Total score (0-10 with 10 being normal): 10 IMPRESSION: 1. No acute intracranial abnormality identified. 2. Stable mild for age chronic microvascular ischemic changes and parenchymal volume loss of the brain. 3. ASPECTS is 10 These results were called by telephone at the time of interpretation on 09/12/2017 at 10:06 pm to Dr. Francine Graven , who verbally acknowledged these results. Electronically Signed   By: Kristine Garbe M.D.   On: 09/12/2017 22:07    EKG: Independently reviewed. afib no acute issues Case discussed with dr Elise Benne Old chart reviewed Case discussed with floor RN  Assessment/Plan 82 yo female comes in with slurred speech, confusion concerning for underlying cva  Principal Problem:    Slurred speech- r/o CVA.  Obtain mri brain, carotid dopplers, cardiac echo.  Nothing focal at this time, speech clear.  May be pharmacy related. freq neuro cks.  Ck flp and hga1c.  Cont coumadin and aspirin.  Active Problems:   Anxiety state- noted, on chronic xanax   Atrial fibrillation ([place])- cont home meds   GERD (gastroesophageal reflux disease)- noted   Long term current use of anticoagulant therapy- cont coumadin   Mild pulmonary hypertension ([place])- noted   Moderate mitral regurgitation by prior  echocardiogram- echo in am   Essential hypertension- allow some permissive high bp overnight   Altered mental status- may be pharmacy related, pt in quite a bit of pain will resume her norco despite medications affects as she is uncomfortable right now   Chronic prescription benzodiazepine use- noted   Opiate use-  Chronic back pain.  - resume home opiates.  Resume lidocaine patch.  Apply warm compress.     DVT prophylaxis:  scds Code Status:   full Family Communication:  none Disposition Plan:  Per day team Consults called:  none Admission status:  observation   Sameul Tagle A MD [facility]  If 7PM-7AM, please contact night-coverage [email or web address] Password [email or web address]  09/13/2017, 12:11 AM

## 2017-09-14 ENCOUNTER — Observation Stay (HOSPITAL_COMMUNITY): Payer: Medicare Other

## 2017-09-14 DIAGNOSIS — Z79899 Other long term (current) drug therapy: Secondary | ICD-10-CM | POA: Diagnosis not present

## 2017-09-14 DIAGNOSIS — R4781 Slurred speech: Secondary | ICD-10-CM | POA: Diagnosis not present

## 2017-09-14 DIAGNOSIS — I10 Essential (primary) hypertension: Secondary | ICD-10-CM | POA: Diagnosis not present

## 2017-09-14 DIAGNOSIS — R4182 Altered mental status, unspecified: Secondary | ICD-10-CM | POA: Diagnosis not present

## 2017-09-14 DIAGNOSIS — G9340 Encephalopathy, unspecified: Secondary | ICD-10-CM | POA: Diagnosis not present

## 2017-09-14 DIAGNOSIS — I482 Chronic atrial fibrillation: Secondary | ICD-10-CM | POA: Diagnosis not present

## 2017-09-14 DIAGNOSIS — M6281 Muscle weakness (generalized): Secondary | ICD-10-CM | POA: Diagnosis not present

## 2017-09-14 DIAGNOSIS — E038 Other specified hypothyroidism: Secondary | ICD-10-CM | POA: Diagnosis not present

## 2017-09-14 DIAGNOSIS — R269 Unspecified abnormalities of gait and mobility: Secondary | ICD-10-CM | POA: Diagnosis not present

## 2017-09-14 DIAGNOSIS — S22080A Wedge compression fracture of T11-T12 vertebra, initial encounter for closed fracture: Secondary | ICD-10-CM | POA: Diagnosis not present

## 2017-09-14 DIAGNOSIS — R410 Disorientation, unspecified: Secondary | ICD-10-CM | POA: Diagnosis not present

## 2017-09-14 LAB — URINE CULTURE: Culture: NO GROWTH

## 2017-09-14 LAB — PROTIME-INR
INR: 2.09
Prothrombin Time: 23.3 seconds — ABNORMAL HIGH (ref 11.4–15.2)

## 2017-09-14 LAB — BASIC METABOLIC PANEL
Anion gap: 9 (ref 5–15)
BUN: 18 mg/dL (ref 6–20)
CO2: 24 mmol/L (ref 22–32)
Calcium: 8.7 mg/dL — ABNORMAL LOW (ref 8.9–10.3)
Chloride: 95 mmol/L — ABNORMAL LOW (ref 101–111)
Creatinine, Ser: 0.93 mg/dL (ref 0.44–1.00)
GFR calc Af Amer: 60 mL/min — ABNORMAL LOW (ref >60)
GFR calc non Af Amer: 52 mL/min — ABNORMAL LOW (ref >60)
Glucose, Bld: 119 mg/dL — ABNORMAL HIGH (ref 65–99)
Potassium: 4.1 mmol/L (ref 3.5–5.1)
Sodium: 128 mmol/L — ABNORMAL LOW (ref 135–145)

## 2017-09-14 LAB — CBC
HCT: 40.7 % (ref 36.0–46.0)
Hemoglobin: 13.9 g/dL (ref 12.0–15.0)
MCH: 31.3 pg (ref 26.0–34.0)
MCHC: 34.2 g/dL (ref 30.0–36.0)
MCV: 91.7 fL (ref 78.0–100.0)
Platelets: 554 10*3/uL — ABNORMAL HIGH (ref 150–400)
RBC: 4.44 MIL/uL (ref 3.87–5.11)
RDW: 13.6 % (ref 11.5–15.5)
WBC: 11.5 10*3/uL — ABNORMAL HIGH (ref 4.0–10.5)

## 2017-09-14 LAB — RPR: RPR Ser Ql: NONREACTIVE

## 2017-09-14 LAB — HIV ANTIBODY (ROUTINE TESTING W REFLEX): HIV Screen 4th Generation wRfx: NONREACTIVE

## 2017-09-14 NOTE — Discharge Summary (Signed)
Physician Discharge Summary  Leslie Rosario ZOX:[id number] DOB: Mar 20, 1923 DOA: 09/12/2017  PCP: Celene Squibb, MD  Admit date: 09/12/2017 Discharge date: 09/14/2017  Admitted From: Home Disposition:  Home   Recommendations for Outpatient Follow-up:  1. Follow up with PCP in 1-2 weeks 2. Please obtain BMP/CBC in one week   Home Health:YES Equipment/Devices: HHPT, rolling walker  Discharge Condition: Stable CODE STATUS:FULL Diet recommendation: Heart Healthy   Brief/Interim Summary: 83 year old female with a history of permanent atrial fibrillation, hyperlipidemia, hypothyroidism, T12 compression fracture, and GERD presenting with slurred speech and confusion. The patient's son stated that she was "normal" on the morning of 09/12/2017. However after he returned home from running some errands he noted the patient to be confused and have slurred speech. As a result, the patient was put in hospital for further evaluation. At baseline, the patient's son states that she is alert and oriented 3, and she is able to perform all her activities of daily living without assistance. Addition, the patient administers her own medications without difficulty according to the son. The patient is supposed to ambulate with a cane, but she often does not use it. The patient's son states that the patient has been in her usual state of health without any complaints this past week.  In the emergency department, the patient's slurred speech improved.  Teleneurology was consulted and did not feel patient's symptoms were consistent with stroke, and she was not a tPA candidate due to warfarin.  CT brain was negative for acute findings.  Discharge Diagnoses:  Acute toxic encephalopathy -Due opiates and benzodiazepines--son suspects pt may have taken an extra hydrocodone at home; he states she usually only takes one-half tab (previously could not tolerate oxycodone due to confusion) -also likely due to combination of  robaxin, alprazolam -Serum B12--346 -TSH--1.054 -RPR--negative -ammonia--<9 -MRI brain--negative for acute findings -Urinalysis is negative for pyuria -09/13/17--pt remains confused -09/14/2017--improved. Mental status near baseline according to the patient's son at the bedside -personally reviewed EKG--sinus, nonspecific Twave changes -personally reviewed CXR--increase interstitial changes, no consolidation  Slurred speech -Improved -09/12/2018 CT brain--negative -MRI brain--neg for acute findings -LDL--97 -HbA1C--5.7 -Echo--EF 60-65%, no WMA, mild to moderate dilated RV -continue ASA  Permanent atrial fibrillation -Continue warfarin -Rate controlled -INR therapeutic throughout hospitalization -INR 2.09 on day of d/c -Continue metoprolol succinate  Essential hypertension -continue metoprolol succinate  T12 compression fracture -Noted on CT abdomen 07/03/2009 -Continue judicious opioids for now -PT evaluation-->HHPT -Hold gabapentin, Robaxin, alprazolam  Hyperlipidemia -Continue statin  Hypothyroidism -Continue Synthroid  Chronic constipation -Continue cathartics   Discharge Instructions  Discharge Instructions    Diet - low sodium heart healthy    Complete by:  As directed    Increase activity slowly    Complete by:  As directed      Allergies as of 09/14/2017      Reactions   Adhesive [tape] Other (See Comments)   REACTION: pulls skin   Codeine    REACTION: UNKNOWN REACTION   Prednisone Other (See Comments)   REACTION: UNKNOWN   Sulfonamide Derivatives    REACTION: UNKNOWN REACTION   Vicodin [hydrocodone-acetaminophen]       Medication List    STOP taking these medications   oxyCODONE-acetaminophen 5-325 MG tablet Commonly known as:  PERCOCET     TAKE these medications   acetaminophen 500 MG tablet Commonly known as:  TYLENOL Take 500 mg by mouth every 6 (six) hours as needed.   ALPRAZolam 0.5 MG tablet Commonly known as:  XANAX Take 0.5 mg by mouth at bedtime as needed. Patient states that she takes 1 1/2 every night to help with sleep   ANTACID ANTI-GAS MAX STRENGTH 400-400-40 MG/5ML suspension Generic drug:  alum & mag hydroxide-simeth Take 5 mLs by mouth as needed for indigestion.   bisacodyl 10 MG suppository Commonly known as:  DULCOLAX Place 10 mg rectally every three (3) days as needed for moderate constipation.   diclofenac sodium 1 % Gel Commonly known as:  VOLTAREN Apply topically 4 (four) times daily.   gabapentin 100 MG capsule Commonly known as:  NEURONTIN Take 100 mg by mouth daily.   HYDROcodone-acetaminophen 5-325 MG tablet Commonly known as:  NORCO/VICODIN Take 1 tablet by mouth daily as needed for moderate pain or severe pain.   levothyroxine 75 MCG tablet Commonly known as:  SYNTHROID, LEVOTHROID Take 75 mcg by mouth daily before breakfast.   lidocaine 5 % Commonly known as:  LIDODERM Place 1 patch onto the skin daily. Remove & Discard patch within 12 hours or as directed by MD   metoprolol succinate 25 MG 24 hr tablet Commonly known as:  TOPROL-XL Take 12.5 mg by mouth daily.   omeprazole 20 MG capsule Commonly known as:  PRILOSEC Take 20 mg by mouth every morning.   simvastatin 20 MG tablet Commonly known as:  ZOCOR Take 10 mg by mouth daily.   warfarin 3 MG tablet Commonly known as:  COUMADIN Take 3 mg by mouth as directed. Patient states that she is taking 3 mg daily            Durable Medical Equipment        Start     Ordered   09/14/17 1100  For home use only DME Walker rolling  Once    Question:  Patient needs a walker to treat with the following condition  Answer:  Gait instability   09/14/17 1059     Follow-up Information    Celene Squibb, MD Follow up in 1 week(s).   Specialty:  Internal Medicine Contact information: [place] [address] [phone]          Allergies  Allergen Reactions  . Adhesive [Tape]  Other (See Comments)    REACTION: pulls skin  . Codeine     REACTION: UNKNOWN REACTION  . Prednisone Other (See Comments)    REACTION: UNKNOWN  . Sulfonamide Derivatives     REACTION: UNKNOWN REACTION  . Vicodin [Hydrocodone-Acetaminophen]     Consultations:  none   Procedures/Studies: Dg Chest 1 View  Result Date: 09/12/2017 CLINICAL DATA:  Slurred speech and altered mental status.  Stroke. EXAM: CHEST 1 VIEW COMPARISON:  Radiograph 02/25/2009 FINDINGS: Stable cardiomegaly mediastinal contours with aortic atherosclerosis. Diffuse peribronchial and interstitial thickening consistent with pulmonary edema. Small left pleural effusion suspected. No confluent airspace disease. No pneumothorax. IMPRESSION: Cardiomegaly pulmonary edema and probable small left pleural effusion, suspicious for CHF. Electronically Signed   By: Jeb Levering M.D.   On: 09/12/2017 22:54   Mr Brain Wo Contrast  Result Date: 09/14/2017 CLINICAL DATA:  83 year old female with confusion and slurred speech for 2 days. No known injury. EXAM: MRI HEAD WITHOUT CONTRAST TECHNIQUE: Multiplanar, multiecho pulse sequences of the brain and surrounding structures were obtained without intravenous contrast. COMPARISON:  Head CT without contrast 09/12/2017. Brain MRI 02/26/2011. FINDINGS: Brain: Cerebral volume remains normal for age. No restricted diffusion to suggest acute infarction. No midline shift, mass effect, evidence of mass lesion, ventriculomegaly, extra-axial  collection or acute intracranial hemorrhage. Cervicomedullary junction and pituitary are within normal limits. Pearline Cables and white matter signal is stable since 2012 and within normal limits for age. No cortical encephalomalacia or chronic cerebral blood products. Normal deep gray matter nuclei, brainstem and cerebellum. Vascular: Major intracranial vascular flow voids are stable and appear normal. Skull and upper cervical spine: Negative. Visualized bone marrow signal is  within normal limits. Sinuses/Orbits: Stable and negative orbits soft tissues. Minimal to mild paranasal sinus mucosal thickening. Other: Visible internal auditory structures appear normal. Mastoid air cells are clear. Scalp and face soft tissues appear negative. IMPRESSION: No acute intracranial abnormality and normal for age noncontrast MRI appearance of the brain. Electronically Signed   By: Genevie Ann M.D.   On: 09/14/2017 10:20   US Carotid Bilateral (at [facility] Only)  Result Date: 09/13/2017 CLINICAL DATA:  Slurred speech. Altered mental status for 1 day. History of hypertension, CAD, hyperlipidemia, TIA and smoking. EXAM: BILATERAL CAROTID DUPLEX ULTRASOUND TECHNIQUE: Pearline Cables scale imaging, color Doppler and duplex ultrasound were performed of bilateral carotid and vertebral arteries in the neck. COMPARISON:  None. FINDINGS: Criteria: Quantification of carotid stenosis is based on velocity parameters that correlate the residual internal carotid diameter with NASCET-based stenosis levels, using the diameter of the distal internal carotid lumen as the denominator for stenosis measurement. The following velocity measurements were obtained: RIGHT ICA:  47/9 cm/sec CCA:  90/2 cm/sec SYSTOLIC ICA/CCA RATIO:  0.8 DIASTOLIC ICA/CCA RATIO:  1.1 ECA:  88 cm/sec LEFT ICA:  43/8 cm/sec CCA:  40/97 cm/sec SYSTOLIC ICA/CCA RATIO:  0.7 DIASTOLIC ICA/CCA RATIO:  0.8 ECA:  36 cm/sec RIGHT CAROTID ARTERY: There is a minimal amount of atherosclerotic plaque within the right carotid bulb (images 14 and 16), extending to involve the origin and proximal aspects the right internal carotid artery (image 24), not resulting in elevated peak systolic velocities within the interrogated course the right internal carotid artery to suggest a hemodynamically significant stenosis. RIGHT VERTEBRAL ARTERY:  Antegrade flow LEFT CAROTID ARTERY: There is a minimal amount of intimal wall thickening involving the mid and distal aspects of the  left common carotid artery (images 41 and 45). There is a minimal amount of atherosclerotic plaque within the left carotid bulb (image 50), extending to involve the origin and proximal aspect the left internal carotid artery (image 58), not resulting in elevated peak systolic velocities within the interrogated course the left internal carotid artery to suggest a hemodynamically significant stenosis. LEFT VERTEBRAL ARTERY:  Antegrade flow IMPRESSION: Very minimal amount of bilateral intimal thickening and atherosclerotic plaque, not resulting in a hemodynamically significant stenosis within either internal carotid artery. Electronically Signed   By: Sandi Mariscal M.D.   On: 09/13/2017 12:47   Ct Head Code Stroke Wo Contrast  Result Date: 09/12/2017 CLINICAL DATA:  Code stroke. 83 y/o F; altered mental status and slurred speech. EXAM: CT HEAD WITHOUT CONTRAST TECHNIQUE: Contiguous axial images were obtained from the base of the skull through the vertex without intravenous contrast. COMPARISON:  07/18/2012 CT head FINDINGS: Brain: No evidence of acute infarction, hemorrhage, hydrocephalus, extra-axial collection or mass lesion/mass effect. Mild chronic microvascular ischemic changes parenchymal volume loss of the brain are stable. Vascular: Moderate calcific atherosclerosis.  No hyperdense vessel. Skull: Normal. Negative for fracture or focal lesion. Sinuses/Orbits: Right maxillary and sphenoid sinus mucosal thickening. Normal aeration of mastoid air cells. Orbits are unremarkable. Other: None. ASPECTS [facility] Stroke Program Early CT Score) - Ganglionic level infarction (caudate, lentiform nuclei,  internal capsule, insula, M1-M3 cortex): 7 - Supraganglionic infarction (M4-M6 cortex): 3 Total score (0-10 with 10 being normal): 10 IMPRESSION: 1. No acute intracranial abnormality identified. 2. Stable mild for age chronic microvascular ischemic changes and parenchymal volume loss of the brain. 3. ASPECTS is 10 These  results were called by telephone at the time of interpretation on 09/12/2017 at 10:06 pm to Dr. Francine Graven , who verbally acknowledged these results. Electronically Signed   By: Kristine Garbe M.D.   On: 09/12/2017 22:07        Discharge Exam: Vitals:   09/14/17 0520 09/14/17 0859  BP: (!) 164/62 (!) 158/78  Pulse: (!) 55 70  Resp: 18 17  Temp: 97.6 F (36.4 C) 98.1 F (36.7 C)  SpO2: 97% 98%   Vitals:   09/13/17 2120 09/14/17 0120 09/14/17 0520 09/14/17 0859  BP: (!) 156/66 (!) 162/62 (!) 164/62 (!) 158/78  Pulse: (!) 58 (!) 59 (!) 55 70  Resp: 18 18 18 17   Temp: 99 F (37.2 C) 98.2 F (36.8 C) 97.6 F (36.4 C) 98.1 F (36.7 C)  TempSrc: Oral Oral Oral Oral  SpO2: 97% 97% 97% 98%  Weight:      Height:        General: Pt is alert, awake, not in acute distress Cardiovascular: RRR, S1/S2 +, no rubs, no gallops Respiratory: CTA bilaterally, no wheezing, no rhonchi Abdominal: Soft, NT, ND, bowel sounds + Extremities: no edema, no cyanosis   The results of significant diagnostics from this hospitalization (including imaging, microbiology, ancillary and laboratory) are listed below for reference.    Significant Diagnostic Studies: Dg Chest 1 View  Result Date: 09/12/2017 CLINICAL DATA:  Slurred speech and altered mental status.  Stroke. EXAM: CHEST 1 VIEW COMPARISON:  Radiograph 02/25/2009 FINDINGS: Stable cardiomegaly mediastinal contours with aortic atherosclerosis. Diffuse peribronchial and interstitial thickening consistent with pulmonary edema. Small left pleural effusion suspected. No confluent airspace disease. No pneumothorax. IMPRESSION: Cardiomegaly pulmonary edema and probable small left pleural effusion, suspicious for CHF. Electronically Signed   By: Jeb Levering M.D.   On: 09/12/2017 22:54   Mr Brain Wo Contrast  Result Date: 09/14/2017 CLINICAL DATA:  83 year old female with confusion and slurred speech for 2 days. No known injury. EXAM:  MRI HEAD WITHOUT CONTRAST TECHNIQUE: Multiplanar, multiecho pulse sequences of the brain and surrounding structures were obtained without intravenous contrast. COMPARISON:  Head CT without contrast 09/12/2017. Brain MRI 02/26/2011. FINDINGS: Brain: Cerebral volume remains normal for age. No restricted diffusion to suggest acute infarction. No midline shift, mass effect, evidence of mass lesion, ventriculomegaly, extra-axial collection or acute intracranial hemorrhage. Cervicomedullary junction and pituitary are within normal limits. Pearline Cables and white matter signal is stable since 2012 and within normal limits for age. No cortical encephalomalacia or chronic cerebral blood products. Normal deep gray matter nuclei, brainstem and cerebellum. Vascular: Major intracranial vascular flow voids are stable and appear normal. Skull and upper cervical spine: Negative. Visualized bone marrow signal is within normal limits. Sinuses/Orbits: Stable and negative orbits soft tissues. Minimal to mild paranasal sinus mucosal thickening. Other: Visible internal auditory structures appear normal. Mastoid air cells are clear. Scalp and face soft tissues appear negative. IMPRESSION: No acute intracranial abnormality and normal for age noncontrast MRI appearance of the brain. Electronically Signed   By: Genevie Ann M.D.   On: 09/14/2017 10:20   US Carotid Bilateral (at [facility] And Ap Only)  Result Date: 09/13/2017 CLINICAL DATA:  Slurred speech. Altered mental status for 1  day. History of hypertension, CAD, hyperlipidemia, TIA and smoking. EXAM: BILATERAL CAROTID DUPLEX ULTRASOUND TECHNIQUE: Pearline Cables scale imaging, color Doppler and duplex ultrasound were performed of bilateral carotid and vertebral arteries in the neck. COMPARISON:  None. FINDINGS: Criteria: Quantification of carotid stenosis is based on velocity parameters that correlate the residual internal carotid diameter with NASCET-based stenosis levels, using the diameter of the distal  internal carotid lumen as the denominator for stenosis measurement. The following velocity measurements were obtained: RIGHT ICA:  47/9 cm/sec CCA:  70/6 cm/sec SYSTOLIC ICA/CCA RATIO:  0.8 DIASTOLIC ICA/CCA RATIO:  1.1 ECA:  88 cm/sec LEFT ICA:  43/8 cm/sec CCA:  23/76 cm/sec SYSTOLIC ICA/CCA RATIO:  0.7 DIASTOLIC ICA/CCA RATIO:  0.8 ECA:  36 cm/sec RIGHT CAROTID ARTERY: There is a minimal amount of atherosclerotic plaque within the right carotid bulb (images 14 and 16), extending to involve the origin and proximal aspects the right internal carotid artery (image 24), not resulting in elevated peak systolic velocities within the interrogated course the right internal carotid artery to suggest a hemodynamically significant stenosis. RIGHT VERTEBRAL ARTERY:  Antegrade flow LEFT CAROTID ARTERY: There is a minimal amount of intimal wall thickening involving the mid and distal aspects of the left common carotid artery (images 41 and 45). There is a minimal amount of atherosclerotic plaque within the left carotid bulb (image 50), extending to involve the origin and proximal aspect the left internal carotid artery (image 58), not resulting in elevated peak systolic velocities within the interrogated course the left internal carotid artery to suggest a hemodynamically significant stenosis. LEFT VERTEBRAL ARTERY:  Antegrade flow IMPRESSION: Very minimal amount of bilateral intimal thickening and atherosclerotic plaque, not resulting in a hemodynamically significant stenosis within either internal carotid artery. Electronically Signed   By: Sandi Mariscal M.D.   On: 09/13/2017 12:47   Ct Head Code Stroke Wo Contrast  Result Date: 09/12/2017 CLINICAL DATA:  Code stroke. 84 y/o F; altered mental status and slurred speech. EXAM: CT HEAD WITHOUT CONTRAST TECHNIQUE: Contiguous axial images were obtained from the base of the skull through the vertex without intravenous contrast. COMPARISON:  07/18/2012 CT head FINDINGS: Brain: No  evidence of acute infarction, hemorrhage, hydrocephalus, extra-axial collection or mass lesion/mass effect. Mild chronic microvascular ischemic changes parenchymal volume loss of the brain are stable. Vascular: Moderate calcific atherosclerosis.  No hyperdense vessel. Skull: Normal. Negative for fracture or focal lesion. Sinuses/Orbits: Right maxillary and sphenoid sinus mucosal thickening. Normal aeration of mastoid air cells. Orbits are unremarkable. Other: None. ASPECTS [facility] Stroke Program Early CT Score) - Ganglionic level infarction (caudate, lentiform nuclei, internal capsule, insula, M1-M3 cortex): 7 - Supraganglionic infarction (M4-M6 cortex): 3 Total score (0-10 with 10 being normal): 10 IMPRESSION: 1. No acute intracranial abnormality identified. 2. Stable mild for age chronic microvascular ischemic changes and parenchymal volume loss of the brain. 3. ASPECTS is 10 These results were called by telephone at the time of interpretation on 09/12/2017 at 10:06 pm to Dr. Francine Graven , who verbally acknowledged these results. Electronically Signed   By: Kristine Garbe M.D.   On: 09/12/2017 22:07     Microbiology: No results found for this or any previous visit (from the past 240 hour(s)).   Labs: Basic Metabolic Panel:  Recent Labs Lab 09/12/17 2235 09/14/17 0634  NA 134* 128*  K 4.0 4.1  CL 100* 95*  CO2 23 24  GLUCOSE 142* 119*  BUN 16 18  CREATININE 1.09* 0.93  CALCIUM 8.9 8.7*   Liver  Function Tests:  Recent Labs Lab 09/12/17 2235  AST 17  ALT 12*  ALKPHOS 104  BILITOT 0.6  PROT 7.4  ALBUMIN 3.9   No results for input(s): LIPASE, AMYLASE in the last 168 hours.  Recent Labs Lab 09/13/17 0929  AMMONIA <9*   CBC:  Recent Labs Lab 09/12/17 2150 09/14/17 0634  WBC 9.3 11.5*  NEUTROABS 6.3  --   HGB 13.3 13.9  HCT 40.0 40.7  MCV 94.6 91.7  PLT 499* 554*   Cardiac Enzymes:  Recent Labs Lab 09/12/17 2235  TROPONINI <0.03   BNP: Invalid  input(s): POCBNP CBG:  Recent Labs Lab 09/12/17 2205  GLUCAP 121*    Time coordinating discharge:  Greater than 30 minutes  Signed:  Aadarsh Cozort, DO [facility] Pager: [phone] 09/14/2017, 11:08 AM

## 2017-09-14 NOTE — Progress Notes (Signed)
SLP Cancellation Note  Patient Details Name: Leslie Rosario MRN: [id number] DOB: 1923/08/16   Cancelled treatment:       Reason Eval/Treat Not Completed: SLP screened, no needs identified, will sign off; SLP screened Pt in room. Pt denies any changes in swallowing, speech, language, or cognition. MRI negative for acute changes. SLE will be deferred at this time. Reconsult if indicated. SLP will sign off.   Thank you,  Genene Churn, [place] 09/14/2017, 3:07 PM

## 2017-09-14 NOTE — Care Management (Signed)
    Durable Medical Equipment        Start     Ordered   09/14/17 1100  For home use only DME Walker rolling  Once    Question:  Patient needs a walker to treat with the following condition  Answer:  Gait instability   09/14/17 1059

## 2017-09-14 NOTE — Care Management Note (Signed)
Case Management Note  Patient Details  Name: Leslie Rosario MRN: [id number] Date of Birth: 03/19/1923  Subjective/Objective:    Adm with slurred speech. From home, lives with son. Ind with ADL's prior to arrival. Has cane, doesn't utilize. Recommended for [facility] PT and RW. Discussed with son and patient. Patient agreeable. Offered choice of home health agencies. Son/patient have no preference.              Action/Plan: CM will notify Brad of [place] who will obtain orders from chart when available.   Expected Discharge Date:     09/14/2017             Expected Discharge Plan:  [place]  In-House Referral:     Discharge planning Services  CM Consult  Post Acute Care Choice:  Durable Medical Equipment, Home Health Choice offered to:  Patient, Adult Children  DME Arranged:  Walker rolling DME Agency:  [place]:  PT [place]:  [place]  Status of Service:  In process, will continue to follow  If discussed at Long Length of Stay Meetings, dates discussed:    Additional Comments:  Tamesha Ellerbrock, [place], RN 09/14/2017, 10:43 AM

## 2017-09-14 NOTE — Care Management Obs Status (Signed)
[place] NOTIFICATION   Patient Details  Name: NEYSA ARTS MRN: [id number] Date of Birth: 25-Oct-1923   Medicare Observation Status Notification Given:  Yes    Blake Goya, [place]un[place], RN 09/14/2017, 9:25 AM

## 2017-09-15 DIAGNOSIS — S22080D Wedge compression fracture of T11-T12 vertebra, subsequent encounter for fracture with routine healing: Secondary | ICD-10-CM | POA: Diagnosis not present

## 2017-09-15 DIAGNOSIS — K219 Gastro-esophageal reflux disease without esophagitis: Secondary | ICD-10-CM | POA: Diagnosis not present

## 2017-09-15 DIAGNOSIS — M549 Dorsalgia, unspecified: Secondary | ICD-10-CM | POA: Diagnosis not present

## 2017-09-15 DIAGNOSIS — I10 Essential (primary) hypertension: Secondary | ICD-10-CM | POA: Diagnosis not present

## 2017-09-15 DIAGNOSIS — F419 Anxiety disorder, unspecified: Secondary | ICD-10-CM | POA: Diagnosis not present

## 2017-09-15 DIAGNOSIS — G92 Toxic encephalopathy: Secondary | ICD-10-CM | POA: Diagnosis not present

## 2017-09-15 DIAGNOSIS — I4891 Unspecified atrial fibrillation: Secondary | ICD-10-CM | POA: Diagnosis not present

## 2017-09-15 DIAGNOSIS — T40601D Poisoning by unspecified narcotics, accidental (unintentional), subsequent encounter: Secondary | ICD-10-CM | POA: Diagnosis not present

## 2017-09-15 DIAGNOSIS — Z79891 Long term (current) use of opiate analgesic: Secondary | ICD-10-CM | POA: Diagnosis not present

## 2017-09-15 DIAGNOSIS — Z7901 Long term (current) use of anticoagulants: Secondary | ICD-10-CM | POA: Diagnosis not present

## 2017-09-21 DIAGNOSIS — S22000A Wedge compression fracture of unspecified thoracic vertebra, initial encounter for closed fracture: Secondary | ICD-10-CM | POA: Diagnosis not present

## 2017-09-21 DIAGNOSIS — D72829 Elevated white blood cell count, unspecified: Secondary | ICD-10-CM | POA: Diagnosis not present

## 2017-09-21 DIAGNOSIS — Z6822 Body mass index (BMI) 22.0-22.9, adult: Secondary | ICD-10-CM | POA: Diagnosis not present

## 2017-09-21 DIAGNOSIS — G9341 Metabolic encephalopathy: Secondary | ICD-10-CM | POA: Diagnosis not present

## 2017-09-21 DIAGNOSIS — E871 Hypo-osmolality and hyponatremia: Secondary | ICD-10-CM | POA: Diagnosis not present

## 2017-09-30 DIAGNOSIS — R358 Other polyuria: Secondary | ICD-10-CM | POA: Diagnosis not present

## 2017-09-30 DIAGNOSIS — Z7901 Long term (current) use of anticoagulants: Secondary | ICD-10-CM | POA: Diagnosis not present

## 2017-10-12 DIAGNOSIS — G894 Chronic pain syndrome: Secondary | ICD-10-CM | POA: Diagnosis not present

## 2017-10-12 DIAGNOSIS — R944 Abnormal results of kidney function studies: Secondary | ICD-10-CM | POA: Diagnosis not present

## 2017-10-12 DIAGNOSIS — S22030D Wedge compression fracture of third thoracic vertebra, subsequent encounter for fracture with routine healing: Secondary | ICD-10-CM | POA: Diagnosis not present

## 2017-10-12 DIAGNOSIS — I482 Chronic atrial fibrillation: Secondary | ICD-10-CM | POA: Diagnosis not present

## 2017-10-12 DIAGNOSIS — I4891 Unspecified atrial fibrillation: Secondary | ICD-10-CM | POA: Diagnosis not present

## 2017-10-12 DIAGNOSIS — R358 Other polyuria: Secondary | ICD-10-CM | POA: Diagnosis not present

## 2017-10-26 DIAGNOSIS — Z7901 Long term (current) use of anticoagulants: Secondary | ICD-10-CM | POA: Diagnosis not present

## 2017-10-26 DIAGNOSIS — R358 Other polyuria: Secondary | ICD-10-CM | POA: Diagnosis not present

## 2017-10-26 DIAGNOSIS — S22089A Unspecified fracture of T11-T12 vertebra, initial encounter for closed fracture: Secondary | ICD-10-CM | POA: Diagnosis not present

## 2017-12-01 DIAGNOSIS — Z87311 Personal history of (healed) other pathological fracture: Secondary | ICD-10-CM | POA: Diagnosis not present

## 2017-12-01 DIAGNOSIS — Z7901 Long term (current) use of anticoagulants: Secondary | ICD-10-CM | POA: Diagnosis not present

## 2017-12-01 DIAGNOSIS — M545 Low back pain: Secondary | ICD-10-CM | POA: Diagnosis not present

## 2017-12-01 DIAGNOSIS — I482 Chronic atrial fibrillation: Secondary | ICD-10-CM | POA: Diagnosis not present

## 2017-12-14 DIAGNOSIS — H6121 Impacted cerumen, right ear: Secondary | ICD-10-CM | POA: Diagnosis not present

## 2017-12-14 DIAGNOSIS — I482 Chronic atrial fibrillation: Secondary | ICD-10-CM | POA: Diagnosis not present

## 2018-01-04 DIAGNOSIS — L720 Epidermal cyst: Secondary | ICD-10-CM | POA: Diagnosis not present

## 2018-01-04 DIAGNOSIS — K08409 Partial loss of teeth, unspecified cause, unspecified class: Secondary | ICD-10-CM | POA: Diagnosis not present

## 2018-01-04 DIAGNOSIS — I482 Chronic atrial fibrillation: Secondary | ICD-10-CM | POA: Diagnosis not present

## 2018-01-04 DIAGNOSIS — Z7901 Long term (current) use of anticoagulants: Secondary | ICD-10-CM | POA: Diagnosis not present

## 2018-01-07 ENCOUNTER — Ambulatory Visit (INDEPENDENT_AMBULATORY_CARE_PROVIDER_SITE_OTHER): Payer: Medicare Other | Admitting: Otolaryngology

## 2018-01-07 DIAGNOSIS — H6123 Impacted cerumen, bilateral: Secondary | ICD-10-CM

## 2018-01-07 DIAGNOSIS — R590 Localized enlarged lymph nodes: Secondary | ICD-10-CM

## 2018-01-11 DIAGNOSIS — I482 Chronic atrial fibrillation: Secondary | ICD-10-CM | POA: Diagnosis not present

## 2018-01-11 DIAGNOSIS — R2689 Other abnormalities of gait and mobility: Secondary | ICD-10-CM | POA: Diagnosis not present

## 2018-01-11 DIAGNOSIS — Z7901 Long term (current) use of anticoagulants: Secondary | ICD-10-CM | POA: Diagnosis not present

## 2018-01-11 DIAGNOSIS — M545 Low back pain: Secondary | ICD-10-CM | POA: Diagnosis not present

## 2018-01-26 DIAGNOSIS — M545 Low back pain: Secondary | ICD-10-CM | POA: Diagnosis not present

## 2018-01-26 DIAGNOSIS — I482 Chronic atrial fibrillation: Secondary | ICD-10-CM | POA: Diagnosis not present

## 2018-02-04 DIAGNOSIS — I482 Chronic atrial fibrillation: Secondary | ICD-10-CM | POA: Diagnosis not present

## 2018-02-04 DIAGNOSIS — R252 Cramp and spasm: Secondary | ICD-10-CM | POA: Diagnosis not present

## 2018-02-04 DIAGNOSIS — M545 Low back pain: Secondary | ICD-10-CM | POA: Diagnosis not present

## 2018-02-04 DIAGNOSIS — J3489 Other specified disorders of nose and nasal sinuses: Secondary | ICD-10-CM | POA: Diagnosis not present

## 2018-02-17 DIAGNOSIS — J060 Acute laryngopharyngitis: Secondary | ICD-10-CM | POA: Diagnosis not present

## 2018-02-17 DIAGNOSIS — R05 Cough: Secondary | ICD-10-CM | POA: Diagnosis not present

## 2018-02-23 DIAGNOSIS — R252 Cramp and spasm: Secondary | ICD-10-CM | POA: Diagnosis not present

## 2018-02-23 DIAGNOSIS — I482 Chronic atrial fibrillation: Secondary | ICD-10-CM | POA: Diagnosis not present

## 2018-02-23 DIAGNOSIS — J3489 Other specified disorders of nose and nasal sinuses: Secondary | ICD-10-CM | POA: Diagnosis not present

## 2018-02-23 DIAGNOSIS — G3184 Mild cognitive impairment, so stated: Secondary | ICD-10-CM | POA: Diagnosis not present

## 2018-02-26 IMAGING — CT CT NECK W/ CM
3 of 4 series · 14 of 33 positions shown, 17 images · IV contrast (iopamidol)
Comparison: None.

CLINICAL DATA: Right jaw mass for 2-3 months.

EXAM:
CT NECK WITH CONTRAST
TECHNIQUE: Multidetector CT imaging of the neck was performed using the
standard protocol following the bolus administration of intravenous
contrast.
CONTRAST:  60mL [id number] IOPAMIDOL (W865I2-0CC) INJECTION 61%

[Series 4: sag neck · sagittal · 0.39mm/px · 5 of 81 slices shown, 6 images]
[im 27/81  bone]
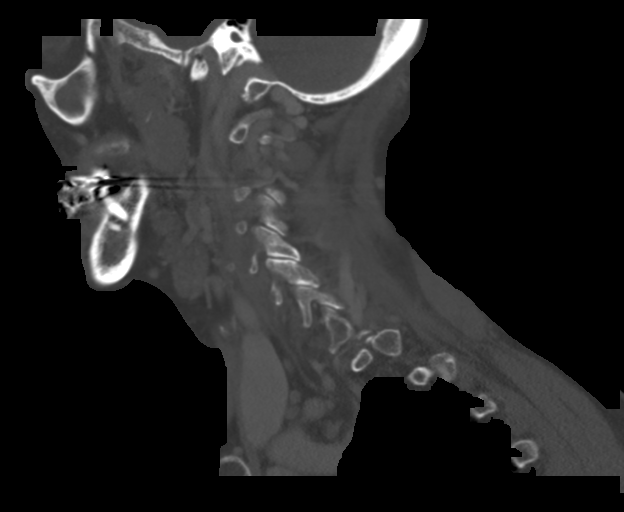
[im 34/81  bone]
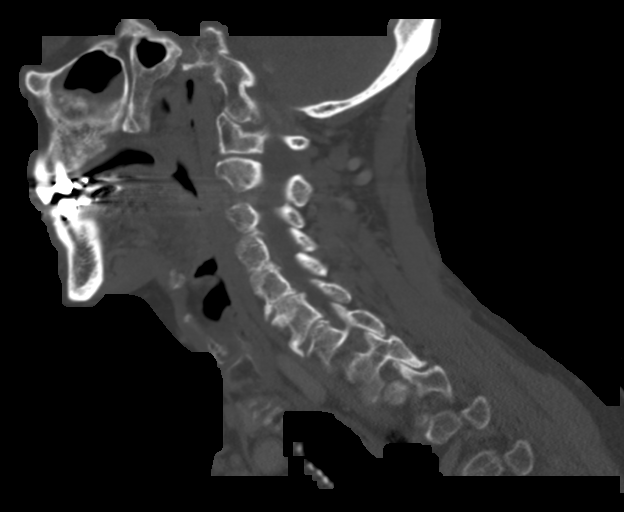
[im 41/81  soft-tissue]
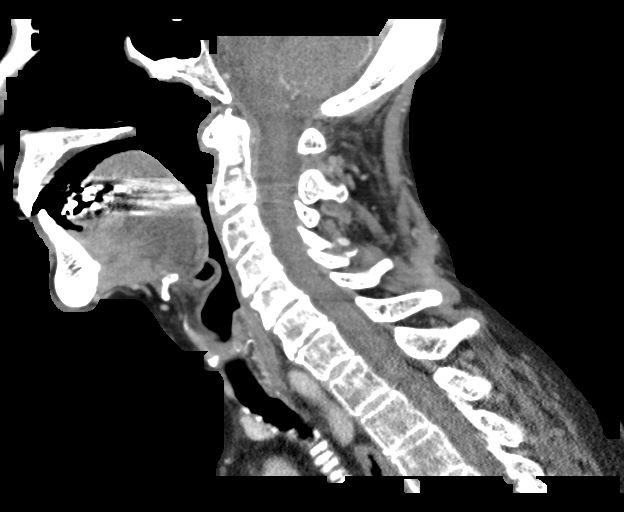
[im 41/81  bone]
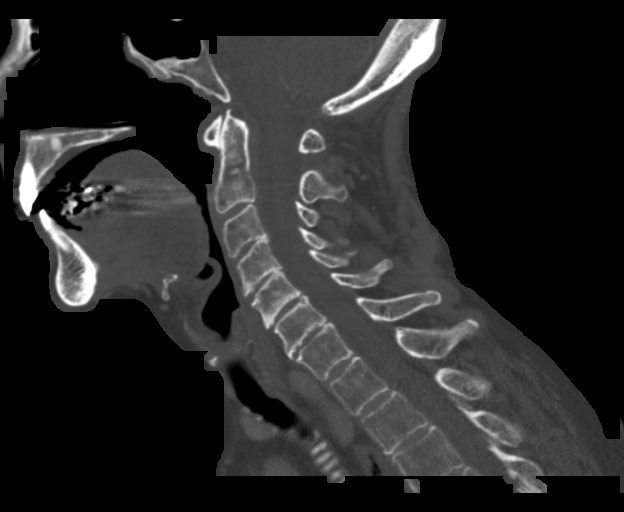
[im 47/81  bone]
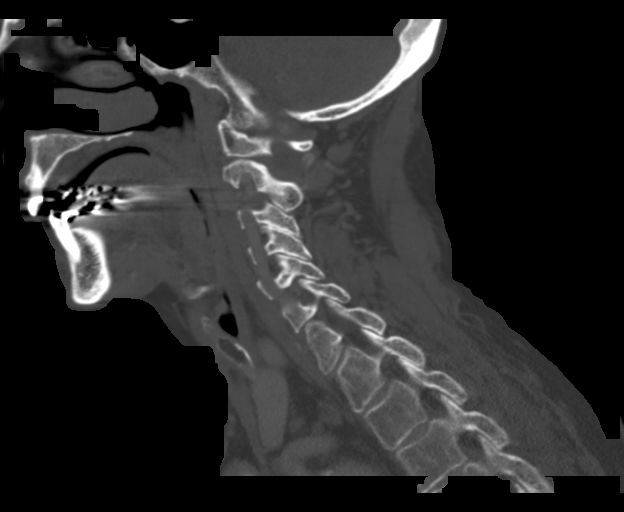
[im 54/81  bone]
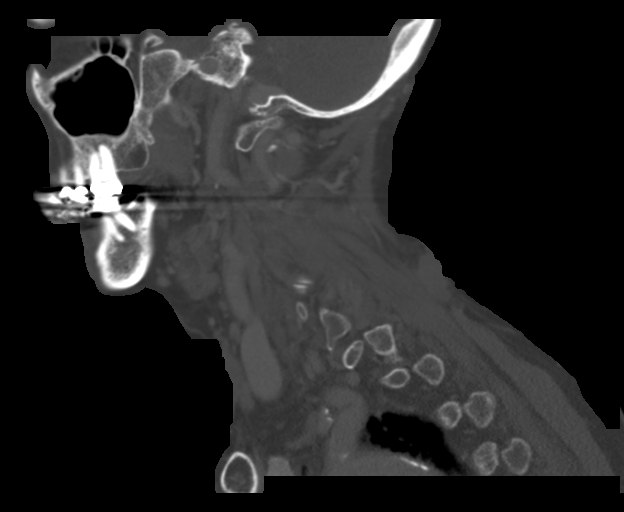

[Series 5: cor neck · coronal · 0.40mm/px · 3 of 106 slices shown]
[im 30/106  bone]
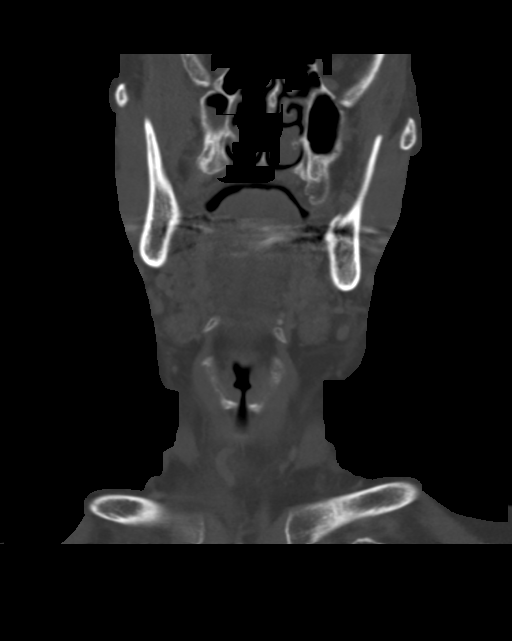
[im 45/106  bone]
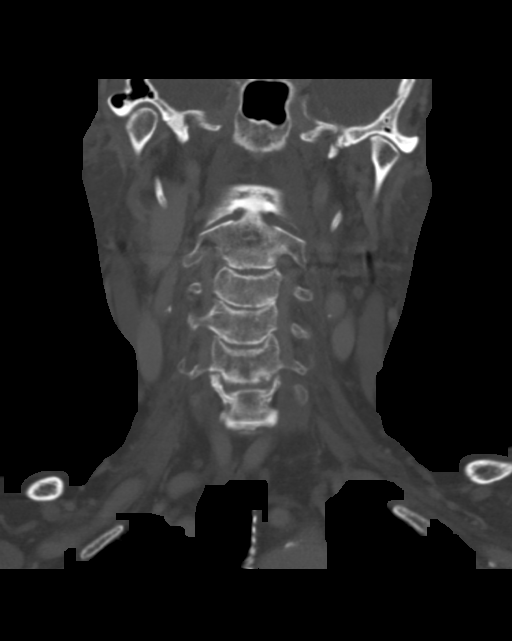
[im 61/106  bone]
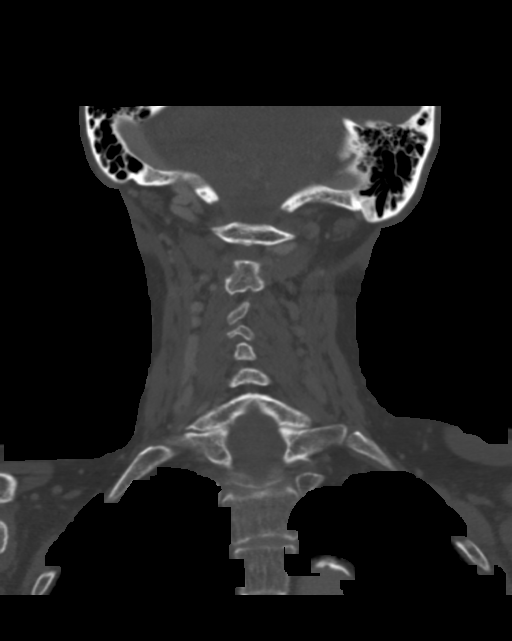

[Series 6: orthogonal ax · axial · 0.39mm/px · z∈[+1392,+1569]mm · 6 of 132 slices shown, 8 images]
[im 19/132  soft-tissue]
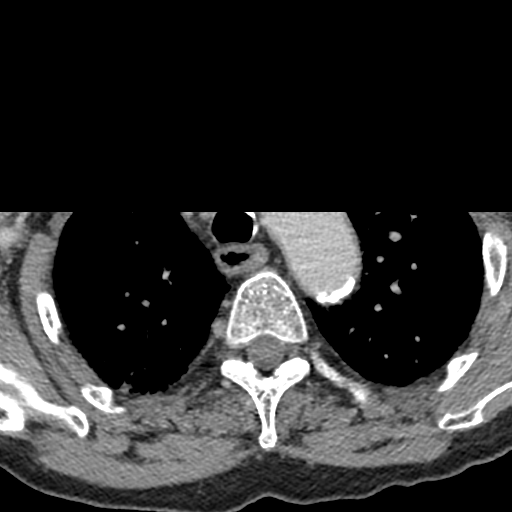
[im 19/132  bone]
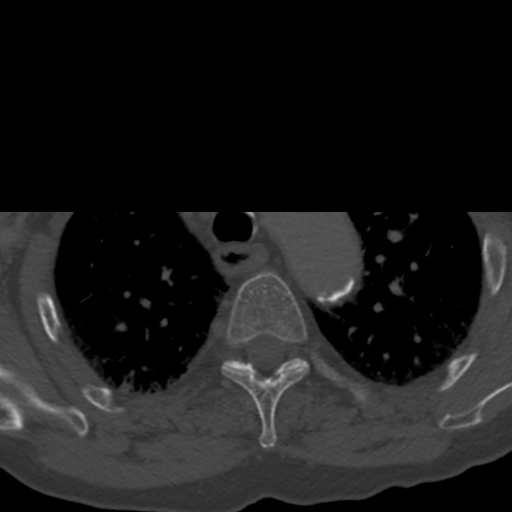
[im 38/132  bone]
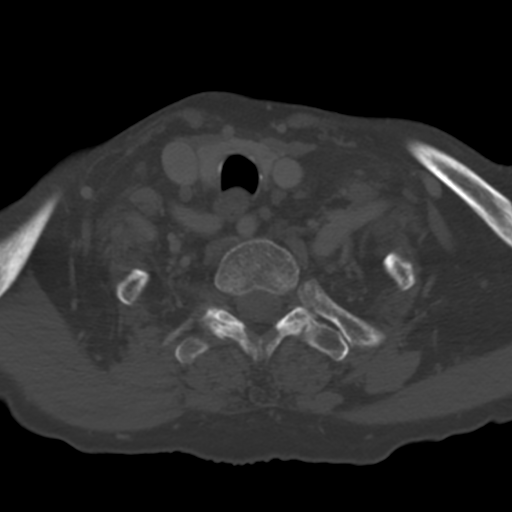
[im 57/132  bone]
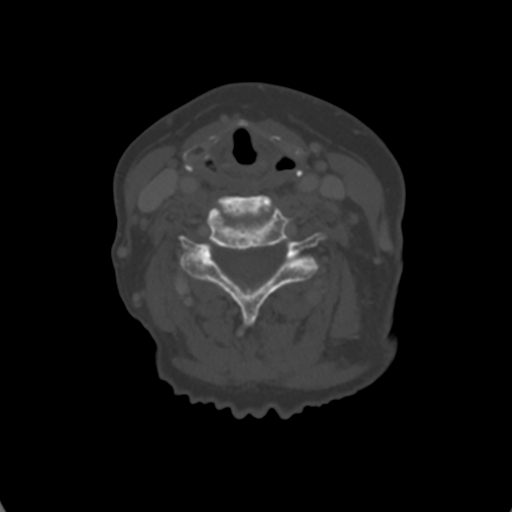
[im 75/132  bone]
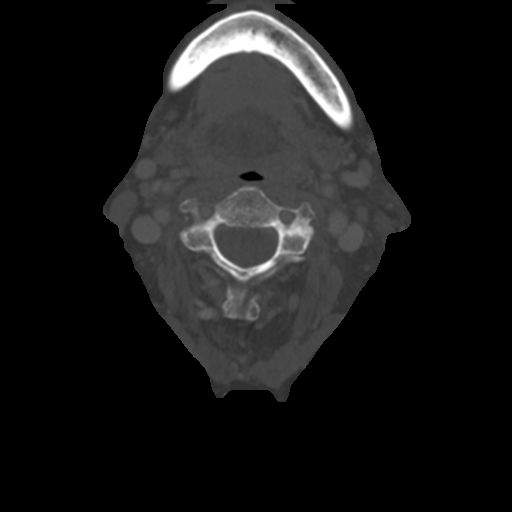
[im 94/132  soft-tissue]
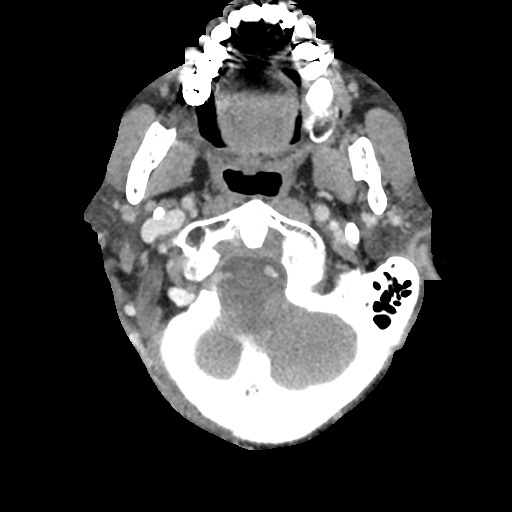
[im 94/132  bone]
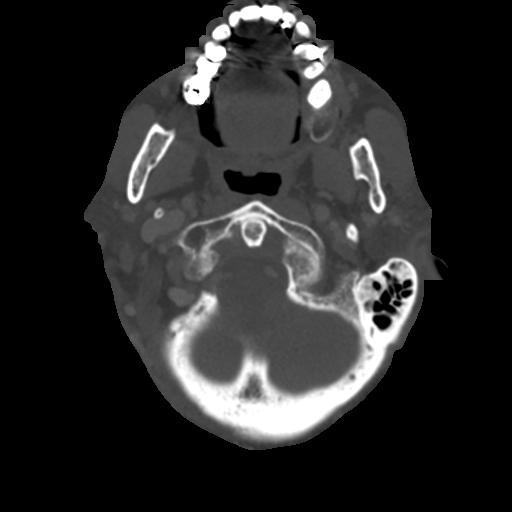
[im 113/132  bone]
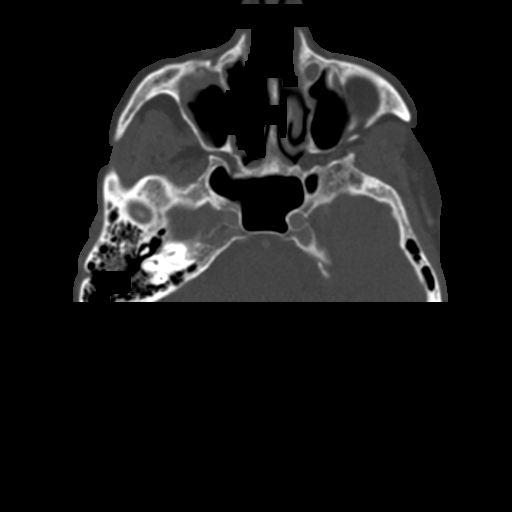

[14 of 33 positions shown; findings below may reference images not displayed]

FINDINGS: Pharynx and larynx: Normal. No mass or swelling.

Salivary glands: No inflammation, mass, or stone.

Thyroid: Normal.

Lymph nodes: No adenopathy. There is a prominent lymph node in the
right supraclavicular fossa measuring 10 mm in short axis, but this
node has a stable appearance (where seen) on 08/27/2010 cervical
spine CT. There are submandibular lymph nodes near the right neck
palpable marker, but symmetric and nonpathologic appearing.

Vascular: Usual atheromatous changes for age.

Limited intracranial: Negative

Visualized orbits: Bilateral cataract resection.

Mastoids and visualized paranasal sinuses: Right maxillary sinusitis
with mucosal thickening centered on the floor.

Skeleton: Usual degenerative changes. No acute or aggressive
finding.

Upper chest: No active disease.

Other: Palpable marker overlies the lower right masseter and
mandibular angle. These structures appear symmetric and normal.
IMPRESSION: 1. No pathologic finding at the right jaw palpable marker.
2. Moderate right maxillary sinusitis.

## 2018-03-22 ENCOUNTER — Encounter (INDEPENDENT_AMBULATORY_CARE_PROVIDER_SITE_OTHER): Payer: Self-pay | Admitting: Internal Medicine

## 2018-03-23 DIAGNOSIS — Z7901 Long term (current) use of anticoagulants: Secondary | ICD-10-CM | POA: Diagnosis not present

## 2018-03-23 DIAGNOSIS — N390 Urinary tract infection, site not specified: Secondary | ICD-10-CM | POA: Diagnosis not present

## 2018-03-23 DIAGNOSIS — R2689 Other abnormalities of gait and mobility: Secondary | ICD-10-CM | POA: Diagnosis not present

## 2018-03-23 DIAGNOSIS — I482 Chronic atrial fibrillation: Secondary | ICD-10-CM | POA: Diagnosis not present

## 2018-03-30 DIAGNOSIS — M545 Low back pain: Secondary | ICD-10-CM | POA: Diagnosis not present

## 2018-03-30 DIAGNOSIS — R252 Cramp and spasm: Secondary | ICD-10-CM | POA: Diagnosis not present

## 2018-03-30 DIAGNOSIS — I482 Chronic atrial fibrillation: Secondary | ICD-10-CM | POA: Diagnosis not present

## 2018-03-30 DIAGNOSIS — R2689 Other abnormalities of gait and mobility: Secondary | ICD-10-CM | POA: Diagnosis not present

## 2018-03-30 DIAGNOSIS — Z7901 Long term (current) use of anticoagulants: Secondary | ICD-10-CM | POA: Diagnosis not present

## 2018-03-30 DIAGNOSIS — S22030D Wedge compression fracture of third thoracic vertebra, subsequent encounter for fracture with routine healing: Secondary | ICD-10-CM | POA: Diagnosis not present

## 2018-03-30 DIAGNOSIS — G894 Chronic pain syndrome: Secondary | ICD-10-CM | POA: Diagnosis not present

## 2018-04-19 DIAGNOSIS — M545 Low back pain: Secondary | ICD-10-CM | POA: Diagnosis not present

## 2018-04-19 DIAGNOSIS — G894 Chronic pain syndrome: Secondary | ICD-10-CM | POA: Diagnosis not present

## 2018-04-19 DIAGNOSIS — J3489 Other specified disorders of nose and nasal sinuses: Secondary | ICD-10-CM | POA: Diagnosis not present

## 2018-04-19 DIAGNOSIS — I482 Chronic atrial fibrillation: Secondary | ICD-10-CM | POA: Diagnosis not present

## 2018-04-20 ENCOUNTER — Ambulatory Visit (INDEPENDENT_AMBULATORY_CARE_PROVIDER_SITE_OTHER): Payer: Medicare Other | Admitting: Internal Medicine

## 2018-04-20 ENCOUNTER — Encounter (INDEPENDENT_AMBULATORY_CARE_PROVIDER_SITE_OTHER): Payer: Self-pay | Admitting: Internal Medicine

## 2018-04-20 VITALS — BP 126/80 | HR 62 | Temp 97.1°F | Resp 18 | Ht 64.0 in | Wt 134.5 lb

## 2018-04-20 DIAGNOSIS — K5900 Constipation, unspecified: Secondary | ICD-10-CM | POA: Diagnosis not present

## 2018-04-20 MED ORDER — POLYETHYLENE GLYCOL 3350 17 GM/SCOOP PO POWD
8.5000 g | Freq: Every day | ORAL | 5 refills | Status: DC
Start: 2018-04-20 — End: 2018-06-22

## 2018-04-20 NOTE — Patient Instructions (Addendum)
High-fiber diet.   Can titrate MiraLAX/polyethylene glycol dose to 8.5 g to 17 g daily every other day.

## 2018-04-20 NOTE — Progress Notes (Signed)
Presenting complaint;  Follow-up for constipation.  Subjective:  Patient is 84 year old Caucasian female who is here for scheduled visit.  She was last seen in April 2018.  She continues to complain of constipation.  She has no more than 3 bowel movements per week.  She is using suppository once or twice a week.  She is not taking any medication by mouth.  She denies abdominal pain melena or rectal bleeding.  She has lost 10 pounds since her last visit.  She does not feel weight loss is due to poor appetite.  Her son lives with her.  He does most of the cooking etc.  While she is not trying to lose weight she is very happy she has lost 10 pounds. Patient's other complain is 1 of back pain.  She says she has daily pain.  She wants some relief.  She has talk with Drs. alcohol about this pain but he has not made any recommendations.    Current Medications: Outpatient Encounter Medications as of 04/20/2018  Medication Sig  . acetaminophen (TYLENOL) 500 MG tablet Take 500 mg by mouth every 6 (six) hours as needed.  . ALPRAZolam (XANAX) 0.5 MG tablet Take 0.5 mg by mouth at bedtime as needed. Patient states that she takes 1 1/2 every night to help with sleep  . bisacodyl (DULCOLAX) 10 MG suppository Place 10 mg rectally every three (3) days as needed for moderate constipation.  Marland Kitchen levothyroxine (SYNTHROID, LEVOTHROID) 75 MCG tablet Take 75 mcg by mouth daily before breakfast.  . metoprolol (TOPROL-XL) 25 MG 24 hr tablet Take 12.5 mg by mouth daily.   . simvastatin (ZOCOR) 20 MG tablet Take 10 mg by mouth daily.   Marland Kitchen warfarin (COUMADIN) 3 MG tablet Take 3 mg by mouth as directed. Patient states that she is taking 3 mg daily  . lidocaine (LIDODERM) 5 % Place 1 patch onto the skin daily. Remove & Discard patch within 12 hours or as directed by MD  . omeprazole (PRILOSEC) 20 MG capsule Take 20 mg by mouth every morning.   . [DISCONTINUED] alum & mag hydroxide-simeth (ANTACID ANTI-GAS MAX STRENGTH)  400-400-40 MG/5ML suspension Take 5 mLs by mouth as needed for indigestion.  . [DISCONTINUED] diclofenac sodium (VOLTAREN) 1 % GEL Apply topically 4 (four) times daily.  . [DISCONTINUED] gabapentin (NEURONTIN) 100 MG capsule Take 100 mg by mouth daily.  . [DISCONTINUED] HYDROcodone-acetaminophen (NORCO/VICODIN) 5-325 MG tablet Take 1 tablet by mouth daily as needed for moderate pain or severe pain.    No facility-administered encounter medications on file as of 04/20/2018.      Objective: Blood pressure 126/80, pulse 62, temperature (!) 97.1 F (36.2 C), temperature source Oral, resp. rate 18, height 5\' 4"  (1.626 m), weight 134 lb 8 oz (61 kg). Patient is alert and in no acute distress. Conjunctiva is pink. Sclera is nonicteric Oropharyngeal mucosa is normal. No neck masses or thyromegaly noted. Cardiac exam with regular rhythm normal S1 and S2. No murmur or gallop noted. Lungs are clear to auscultation. Abdomen is symmetrical soft and nontender without organomegaly or masses. No LE edema or clubbing noted.   Assessment:  #1.  Chronic constipation.  Dietary measures alone are not working.  She needs to go back on MiraLAX.  She should continue use suppository so that she would not get impacted.  #2.  Chronic back pain.  She has history of compression fracture of T12.  She needs to review options with Dr. Nevada Crane.   Plan:  Patient advised to go back on polyethylene glycol and take it anywhere from 8.5 g daily to 17 g daily or every other day. She will use Dulcolax suppository if she goes 2 days without a bowel movement. Patient will call if polyethylene glycol does not work. Office visit in 1 year.

## 2018-05-17 DIAGNOSIS — G3184 Mild cognitive impairment, so stated: Secondary | ICD-10-CM | POA: Diagnosis not present

## 2018-05-17 DIAGNOSIS — G894 Chronic pain syndrome: Secondary | ICD-10-CM | POA: Diagnosis not present

## 2018-05-17 DIAGNOSIS — I482 Chronic atrial fibrillation: Secondary | ICD-10-CM | POA: Diagnosis not present

## 2018-05-17 DIAGNOSIS — M545 Low back pain: Secondary | ICD-10-CM | POA: Diagnosis not present

## 2018-05-18 DIAGNOSIS — M546 Pain in thoracic spine: Secondary | ICD-10-CM | POA: Diagnosis not present

## 2018-05-18 DIAGNOSIS — M545 Low back pain: Secondary | ICD-10-CM | POA: Diagnosis not present

## 2018-05-26 DIAGNOSIS — M546 Pain in thoracic spine: Secondary | ICD-10-CM | POA: Diagnosis not present

## 2018-06-01 DIAGNOSIS — M4854XD Collapsed vertebra, not elsewhere classified, thoracic region, subsequent encounter for fracture with routine healing: Secondary | ICD-10-CM | POA: Diagnosis not present

## 2018-06-07 DIAGNOSIS — G894 Chronic pain syndrome: Secondary | ICD-10-CM | POA: Diagnosis not present

## 2018-06-07 DIAGNOSIS — M545 Low back pain: Secondary | ICD-10-CM | POA: Diagnosis not present

## 2018-06-07 DIAGNOSIS — I482 Chronic atrial fibrillation: Secondary | ICD-10-CM | POA: Diagnosis not present

## 2018-06-07 DIAGNOSIS — G3184 Mild cognitive impairment, so stated: Secondary | ICD-10-CM | POA: Diagnosis not present

## 2018-06-08 ENCOUNTER — Encounter: Payer: Self-pay | Admitting: Cardiovascular Disease

## 2018-06-08 ENCOUNTER — Ambulatory Visit: Payer: Medicare Other | Admitting: Cardiovascular Disease

## 2018-06-08 VITALS — BP 170/82 | HR 70 | Ht 64.0 in | Wt 134.8 lb

## 2018-06-08 DIAGNOSIS — S22000A Wedge compression fracture of unspecified thoracic vertebra, initial encounter for closed fracture: Secondary | ICD-10-CM | POA: Diagnosis not present

## 2018-06-08 DIAGNOSIS — I10 Essential (primary) hypertension: Secondary | ICD-10-CM

## 2018-06-08 DIAGNOSIS — E7800 Pure hypercholesterolemia, unspecified: Secondary | ICD-10-CM

## 2018-06-08 DIAGNOSIS — I4821 Permanent atrial fibrillation: Secondary | ICD-10-CM

## 2018-06-08 DIAGNOSIS — Z79899 Other long term (current) drug therapy: Secondary | ICD-10-CM | POA: Diagnosis not present

## 2018-06-08 DIAGNOSIS — Z7901 Long term (current) use of anticoagulants: Secondary | ICD-10-CM | POA: Diagnosis not present

## 2018-06-08 DIAGNOSIS — I482 Chronic atrial fibrillation: Secondary | ICD-10-CM | POA: Diagnosis not present

## 2018-06-08 NOTE — Progress Notes (Signed)
Patient ID: Leslie Rosario, female   DOB: [birth year], 84 y.o.   MRN: [id number]    Primary M.D.: Dr. Wende Neighbors  HPI: Leslie Rosario is a 84 y.o. female who presents for a 61 month cardiology evaluation.  I last saw her in August 2017  Leslie Rosario has a history of permanent atrial fibrillation and is on Coumadin anticoagulation. She has a history of hypothyroidism on thyroid replacement with Synthroid 75 mcg.There is also a history of hyperlipidemia and is on simvastatin 10 mg. She has  a history of hypertension as well as mild leg swelling.  In the past she had issues with leg edema but this has significant improved with support stockings.  As I last saw her, [place] outside [place].  She sees Dr. Wende Neighbors who checks her laboratory and she will be undergoing fasting labs.  She tells me she is under evaluation for her back and may require future kyphoplasty.  He denies any episodes of chest tightness.  She is unaware of any results of tachycardia.  She has permanent atrial fibrillation and is on chronic warfarin therapy.  She denies presyncope or syncope.  She underwent an echo Doppler study in September 2019 which showed an EF of 60 to 65%.  She had normal wall motion.  There was aortic valve sclerosis, mild mitral regurgitation, moderate biatrial enlargement, and peak PA systolic pressure was increased at 56 mm.  She presents for reevaluation.   Past Medical History:  Diagnosis Date  . Atrial fibrillation ([place])   . Constipation   . GERD (gastroesophageal reflux disease)   . Hyperlipidemia   . Hypothyroidism   . Nausea & vomiting   . T12 compression fracture ([place]) 06/2017    Past Surgical History:  Procedure Laterality Date  . COLONOSCOPY    . NM MYOCAR PERF WALL MOTION  04/2006   dipyridamole; normal pattern of perfusion to all regions, post-stress EF 73%  . TONSILLECTOMY    . TRANSTHORACIC ECHOCARDIOGRAM  04/2012   EF=>55%, borderline conc LVH; LA mod dilated;  RA mildly dilated; mild mitral annular calcif, mod MR; mild-mod TR with elevated RSVP 30-44mHg, mild pulm HTN; mild calcif of AV leaflets, AV mildly sclerotic; aortic root sclerosis/calcif  . UPPER GASTROINTESTINAL ENDOSCOPY      Allergies  Allergen Reactions  . Adhesive [Tape] Other (See Comments)    REACTION: pulls skin  . Codeine     REACTION: UNKNOWN REACTION  . Prednisone Other (See Comments)    REACTION: UNKNOWN  . Sulfonamide Derivatives     REACTION: UNKNOWN REACTION  . Vicodin [Hydrocodone-Acetaminophen]     Current Outpatient Medications  Medication Sig Dispense Refill  . acetaminophen (TYLENOL) 500 MG tablet Take 500 mg by mouth every 6 (six) hours as needed.    . ALPRAZolam (XANAX) 0.5 MG tablet Take 0.5 mg by mouth at bedtime as needed. Patient states that she takes 1 1/2 every night to help with sleep    . bisacodyl (DULCOLAX) 10 MG suppository Place 10 mg rectally every three (3) days as needed for moderate constipation.    .Marland Kitchenlevothyroxine (SYNTHROID, LEVOTHROID) 75 MCG tablet Take 75 mcg by mouth daily before breakfast.    . lidocaine (LIDODERM) 5 % Place 1 patch onto the skin daily. Remove & Discard patch within 12 hours or as directed by MD    . metoprolol (TOPROL-XL) 25 MG 24 hr tablet Take 12.5 mg by mouth daily.     .Marland Kitchen  omeprazole (PRILOSEC) 20 MG capsule Take 20 mg by mouth every morning.     . polyethylene glycol powder (GLYCOLAX/MIRALAX) powder Take 8.5 g by mouth daily. 255 g 5  . simvastatin (ZOCOR) 20 MG tablet Take 10 mg by mouth daily.     Marland Kitchen warfarin (COUMADIN) 3 MG tablet Take 3 mg by mouth as directed. Patient states that she is taking 3 mg daily     No current facility-administered medications for this visit.     Social History   Socioeconomic History  . Marital status: Widowed    Spouse name: Not on file  . Number of children: 3  . Years of education: Not on file  . Highest education level: Not on file  Occupational History    Employer:  RETIRED  Social Needs  . Financial resource strain: Not on file  . Food insecurity:    Worry: Not on file    Inability: Not on file  . Transportation needs:    Medical: Not on file    Non-medical: Not on file  Tobacco Use  . Smoking status: Former Smoker    Last attempt to quit: 01/10/1985    Years since quitting: 33.4  . Smokeless tobacco: Never Used  Substance and Sexual Activity  . Alcohol use: No    Alcohol/week: 0.0 oz  . Drug use: No  . Sexual activity: Not on file  Lifestyle  . Physical activity:    Days per week: Not on file    Minutes per session: Not on file  . Stress: Not on file  Relationships  . Social connections:    Talks on phone: Not on file    Gets together: Not on file    Attends religious service: Not on file    Active member of club or organization: Not on file    Attends meetings of clubs or organizations: Not on file    Relationship status: Not on file  . Intimate partner violence:    Fear of current or ex partner: Not on file    Emotionally abused: Not on file    Physically abused: Not on file    Forced sexual activity: Not on file  Other Topics Concern  . Not on file  Social History Narrative  . Not on file   Social history is notable that she is widowed and has 3 children. Since her husband passed away her son was with her. There is remote tobacco history but she quit in 1986. She remains active.  Family History  Problem Relation Age of Onset  . Cancer Mother   . Ovarian cancer Mother   . Cancer Father   . Cancer Brother   . Emphysema Brother   . Emphysema Brother   . Pneumonia Brother   . Aneurysm Brother   . Healthy Son   . Sleep apnea Son   . Aortic aneurysm Unknown   . Kidney cancer Son     ROS General: Negative; No fevers, chills, or night sweats;  HEENT: Negative; No changes in vision or hearing, sinus congestion, difficulty swallowing Pulmonary: Negative; No cough, wheezing, shortness of breath, hemoptysis Cardiovascular:  Negative; No chest pain, presyncope, syncope, palpitations Resolution of prior edema GI: Negative; No nausea, vomiting, diarrhea, or abdominal pain GU: Negative; No dysuria, hematuria, or difficulty voiding Musculoskeletal: back discomfort Hematologic/Oncology: Negative; no easy bruising, bleeding; on Coumadin Endocrine: Positive for hypothyroidism Neuro: Negative; no changes in balance, headaches Skin: Negative; No rashes or skin lesions Psychiatric: Negative; No  behavioral problems, depression Sleep: Negative; No snoring, daytime sleepiness, hypersomnolence, bruxism, restless legs, hypnogognic hallucinations, no cataplexy Other comprehensive 14 point system review is negative.   PE BP (!) 170/82   Pulse 70   Ht _0  (1.626 m)   Wt 134 lb 12.8 oz (61.1 kg)   BMI 23.14 kg/m    Wt Readings from Last 3 Encounters:  06/08/18 134 lb 12.8 oz (61.1 kg)  04/20/18 134 lb 8 oz (61 kg)  09/13/17 134 lb 0.6 oz (60.8 kg)   General: Alert, oriented, no distress.  Skin: normal turgor, no rashes, warm and dry HEENT: Normocephalic, atraumatic. Pupils equal round and reactive to light; sclera anicteric; extraocular muscles intact;  Nose without nasal septal hypertrophy Mouth/Parynx benign; Mallinpatti scale 2 Neck: No JVD, no carotid bruits; normal carotid upstroke Lungs: clear to ausculatation and percussion; no wheezing or rales Chest wall: without tenderness to palpitation Heart: PMI not displaced, irregularly irregular compatible with atrial fibrillation with a controlled ventricular response., s1 s2 normal, 1/6 systolic murmur, no diastolic murmur, no rubs, gallops, thrills, or heaves Abdomen: soft, nontender; no hepatosplenomehaly, BS+; abdominal aorta nontender and not dilated by palpation. Back: no CVA tenderness Pulses 2+ Musculoskeletal: full range of motion, normal strength, no joint deformities Extremities: no clubbing cyanosis or edema, Homan's sign negative  Neurologic: grossly  nonfocal; Cranial nerves grossly wnl Psychologic: Normal mood and affect   ECG (independently read by me): Atrial fibrillation with ventricular rate at 70 bpm.  Nonspecific ST changes.  Normal intervals.  August 2017 ECG (independently read by me): Atrial fibrillation with ventricular rate at 60 bpm.  QTc interval normal at 436 ms.  April 2016 ECG (independently read by me): Atrial fibrillation with a rate at 53.  No significant ST-T change.  February 2015 ECG (independently read by me): Atrial fibrillation with controlled ventricular response of 62 beats per minute. No significant ST changes.  LABS  BMP Latest Ref Rng & Units 09/14/2017 09/12/2017 07/03/2017  Glucose 65 - 99 mg/dL 119(H) 142(H) 106(H)  BUN 6 - 20 mg/dL _1 Creatinine 0.44 - 1.00 mg/dL 0.93 1.09(H) 1.00  Sodium 135 - 145 mmol/L 128(L) 134(L) 140  Potassium 3.5 - 5.1 mmol/L 4.1 4.0 4.2  Chloride 101 - 111 mmol/L 95(L) 100(L) 104  CO2 22 - 32 mmol/L 24 23 -  Calcium 8.9 - 10.3 mg/dL 8.7(L) 8.9 -    Hepatic Function Latest Ref Rng & Units 09/12/2017 07/19/2012 12/29/2011  Total Protein 6.5 - 8.1 g/dL 7.4 5.6(L) 7.6  Albumin 3.5 - 5.0 g/dL 3.9 2.6(L) 4.0  AST 15 - 41 U/L 17 62(H) 24  ALT 14 - 54 U/L 12(L) 39(H) 18  Alk Phosphatase 38 - 126 U/L 104 93 89  Total Bilirubin 0.3 - 1.2 mg/dL 0.6 1.0 0.4  Bilirubin, Direct 0.0 - 0.3 mg/dL - - <0.1    CBC Latest Ref Rng & Units 09/14/2017 09/12/2017 07/03/2017  WBC 4.0 - 10.5 K/uL 11.5(H) 9.3 -  Hemoglobin 12.0 - 15.0 g/dL 13.9 13.3 13.6  Hematocrit 36.0 - 46.0 % 40.7 40.0 40.0  Platelets 150 - 400 K/uL 554(H) 499(H) -   Lab Results  Component Value Date   MCV 91.7 09/14/2017   MCV 94.6 09/12/2017   MCV 93.2 09/26/2014    Lab Results  Component Value Date   TSH 1.054 09/12/2017    BNP No results found for: PROBNP  Lipid Panel     Component Value Date/Time   CHOL 167 09/13/2017  0533     RADIOLOGY: No results found.  IMPRESSION: 1. Permanent atrial  fibrillation ([place])   2. Essential hypertension   3. Long term current use of anticoagulant therapy   4. HYPERCHOLESTEROLEMIA   5. Medication management   6. Compression fracture of body of thoracic vertebra [facility])     ASSESSMENT AND PLAN: Leslie Rosario is a 84 year old female who appears younger than her stated age. She has a history of permanent atrial fibrillation.  When I last saw her in 2017 I had reduced her Toprol dose.  Presently she is in atrial fibrillation with a controlled ventricular rate on her regimen of Toprol 12.5 mg daily.  He is on warfarin 3 mg and Dr. Wende Neighbors follows her INR.  Has a history of hypothyroidism and is on levothyroxine replacement at 75 mcg.  She is on simvastatin 20 mg for hyperlipidemia.  I reviewed her most recent echo from September 2018 which was done at [facility].  This showed an EF of 60 to 65%.  There was aortic valve sclerosis without stenosis, mild mitral regurgitation, biatrial enlargement, and moderate pulmonary hypertension.  Presently, she feels sluggish but denies any chest pain.  She denies PND orthopnea.  There is no presyncope or syncope.  He has a compression fracture of her thoracic spine.  If she is to undergo future kyphoplasty surgery she will need to hold warfarin for her procedure.  Dr.Hall will be rechecking blood work I will asked that these be sent to me for my review.  I will see her in 1 year for reevaluation.    Time spent: 25 minutes  Troy Sine, MD, [facility]  06/10/2018 1:31 PM

## 2018-06-08 NOTE — Patient Instructions (Signed)
Medication Instructions:  Your physician recommends that you continue on your current medications as directed. Please refer to the Current Medication list given to you today.  Labwork: Please return for FASTING labs at Dr. Juel Burrow office (CMET, CBC, Lipid, TSH)  Our in office lab hours are Monday-Friday 8:00-4:00, closed for lunch 12:45-1:45 pm.  No appointment needed.  Follow-Up: Your physician wants you to follow-up in: 1 year with Dr. Claiborne Billings.  You will receive a reminder letter in the mail two months in advance. If you don't receive a letter, please call our office to schedule the follow-up appointment.   Any Other Special Instructions Will Be Listed Below (If Applicable).     If you need a refill on your cardiac medications before your next appointment, please call your pharmacy.

## 2018-06-10 ENCOUNTER — Encounter: Payer: Self-pay | Admitting: Cardiovascular Disease

## 2018-06-10 DIAGNOSIS — I482 Chronic atrial fibrillation: Secondary | ICD-10-CM | POA: Diagnosis not present

## 2018-06-10 DIAGNOSIS — Z5181 Encounter for therapeutic drug level monitoring: Secondary | ICD-10-CM | POA: Diagnosis not present

## 2018-06-10 DIAGNOSIS — E785 Hyperlipidemia, unspecified: Secondary | ICD-10-CM | POA: Diagnosis not present

## 2018-06-10 DIAGNOSIS — S22030D Wedge compression fracture of third thoracic vertebra, subsequent encounter for fracture with routine healing: Secondary | ICD-10-CM | POA: Diagnosis not present

## 2018-06-10 DIAGNOSIS — Z7901 Long term (current) use of anticoagulants: Secondary | ICD-10-CM | POA: Diagnosis not present

## 2018-06-10 DIAGNOSIS — S22080D Wedge compression fracture of T11-T12 vertebra, subsequent encounter for fracture with routine healing: Secondary | ICD-10-CM | POA: Diagnosis not present

## 2018-06-10 DIAGNOSIS — Z87311 Personal history of (healed) other pathological fracture: Secondary | ICD-10-CM | POA: Diagnosis not present

## 2018-06-10 DIAGNOSIS — Z6822 Body mass index (BMI) 22.0-22.9, adult: Secondary | ICD-10-CM | POA: Diagnosis not present

## 2018-06-10 DIAGNOSIS — E039 Hypothyroidism, unspecified: Secondary | ICD-10-CM | POA: Diagnosis not present

## 2018-06-10 DIAGNOSIS — G3184 Mild cognitive impairment, so stated: Secondary | ICD-10-CM | POA: Diagnosis not present

## 2018-06-10 DIAGNOSIS — G894 Chronic pain syndrome: Secondary | ICD-10-CM | POA: Diagnosis not present

## 2018-06-22 NOTE — H&P (Addendum)
Patient ID: Leslie Rosario MRN: [id number] DOB/AGE: [birth year] 84 y.o.  Admit date: (Not on file)  Admission Diagnoses:  T12 and L1 Compression fractures  HPI: Patient is here for her H&P prior to her kyphoplasty of T12-L1 . Pt has a hx of afib.  Pt is on Warfarin.  Past Medical History: Past Medical History:  Diagnosis Date  . Atrial fibrillation ([place])   . Constipation   . GERD (gastroesophageal reflux disease)   . Hyperlipidemia   . Hypothyroidism   . Nausea & vomiting   . T12 compression fracture ([place]) 06/2017    Surgical History: Past Surgical History:  Procedure Laterality Date  . COLONOSCOPY    . NM MYOCAR PERF WALL MOTION  04/2006   dipyridamole; normal pattern of perfusion to all regions, post-stress EF 73%  . TONSILLECTOMY    . TRANSTHORACIC ECHOCARDIOGRAM  04/2012   EF=>55%, borderline conc LVH; LA mod dilated; RA mildly dilated; mild mitral annular calcif, mod MR; mild-mod TR with elevated RSVP 30-23mmHg, mild pulm HTN; mild calcif of AV leaflets, AV mildly sclerotic; aortic root sclerosis/calcif  . UPPER GASTROINTESTINAL ENDOSCOPY      Family History: Family History  Problem Relation Age of Onset  . Cancer Mother   . Ovarian cancer Mother   . Cancer Father   . Cancer Brother   . Emphysema Brother   . Emphysema Brother   . Pneumonia Brother   . Aneurysm Brother   . Healthy Son   . Sleep apnea Son   . Aortic aneurysm Unknown   . Kidney cancer Son     Social History: Social History   Socioeconomic History  . Marital status: Widowed    Spouse name: Not on file  . Number of children: 3  . Years of education: Not on file  . Highest education level: Not on file  Occupational History    Employer: RETIRED  Social Needs  . Financial resource strain: Not on file  . Food insecurity:    Worry: Not on file    Inability: Not on file  . Transportation needs:    Medical: Not on file    Non-medical: Not on file  Tobacco Use  . Smoking status: Former  Smoker    Last attempt to quit: 01/10/1985    Years since quitting: 33.4  . Smokeless tobacco: Never Used  Substance and Sexual Activity  . Alcohol use: No    Alcohol/week: 0.0 oz  . Drug use: No  . Sexual activity: Not on file  Lifestyle  . Physical activity:    Days per week: Not on file    Minutes per session: Not on file  . Stress: Not on file  Relationships  . Social connections:    Talks on phone: Not on file    Gets together: Not on file    Attends religious service: Not on file    Active member of club or organization: Not on file    Attends meetings of clubs or organizations: Not on file    Relationship status: Not on file  . Intimate partner violence:    Fear of current or ex partner: Not on file    Emotionally abused: Not on file    Physically abused: Not on file    Forced sexual activity: Not on file  Other Topics Concern  . Not on file  Social History Narrative  . Not on file    Allergies: Adhesive [tape]; Codeine; Prednisone; Sulfonamide derivatives; and Vicodin [  hydrocodone-acetaminophen]  Medications: I have reviewed the patient's current medications.  Vital Signs: No data found.  Radiology: No results found.  Labs: No results for input(s): WBC, RBC, HCT, PLT in the last 72 hours. No results for input(s): NA, K, CL, CO2, BUN, CREATININE, GLUCOSE, CALCIUM in the last 72 hours. No results for input(s): LABPT, INR in the last 72 hours.  Review of Systems: ROS  Physical Exam: There is no height or weight on file to calculate BMI.  Physical Exam  Constitutional: She is oriented to person, place, and time. She appears well-developed and well-nourished.  HENT:  Head: Normocephalic.  Cardiovascular: Normal rate and regular rhythm.  Respiratory: Effort normal and breath sounds normal.  GI: Soft. Bowel sounds are normal.  Neurological: She is alert and oriented to person, place, and time.  Skin: Skin is warm and dry.  Psychiatric: She has a normal  mood and affect. Her behavior is normal. Judgment and thought content normal.   She has pain when she sits for long periods of time or when she stands to ambulate.  Pain is localized to the thoracolumbar junction.  No radiation of the pain into the lower extremities.  No numbness or dysesthesias in the lower extremity.  She has generalized weakness but no focal deficits.  No hip, knee, ankle pain with isolated joint range of motion.  Diminished but palpable pulses in the lower extremity.  Lumbar MRI: completed on 05/26/18 was reviewed with the patient.  I have also reviewed the radiology report.  Chronic wedge deformity of T12 and L1.  Proximally 30% loss of vertebral height of L1, 50% loss of height at T12.  No significant stenosis is noted.    Assessment and Plan: Risks and benefits of surgery were discussed with the patient. These include: Infection, bleeding, death, stroke, paralysis, ongoing or worse pain, need for additional surgery, leak of spinal fluid, adjacent segment degeneration requiring additional surgery, post-operative hematoma formation.  Risk of deep venous thrombosis (DVT) and the need for additional treatment.   Goal of surgery: Reduce (not eliminate) pain, and improve quality of life.   Ronette Deter, PAC for Melina Schools, MD [facility] [phone]  Patient continues to have significant back pain secondary to level osteoporotic compression fracture.  No change in her clinical exam from her H&P on 06/22/2018.  We have gone over the surgical procedure which is a 2 level kyphoplasty.  I have again reviewed the risks benefits and alternatives to surgery with the patient and her family and all their questions were encouraged and addressed.  At this point I spoken with anesthesia and we will try to do this surgery with IV sedation and local anesthesia.  Given her age and underlying medical comorbidities I do believe that he will most likely be easier in the postoperative  period if she does not have a general anesthetic.  If however she cannot tolerat the position or the procedure then we will abort and move forward with general anesthesia.

## 2018-06-24 ENCOUNTER — Encounter (HOSPITAL_COMMUNITY): Payer: Self-pay

## 2018-06-24 NOTE — Pre-Procedure Instructions (Signed)
Leslie Rosario  06/24/2018      [facility] - [place], [place] [address] Phone: [phone] Fax: [phone]    Your procedure is scheduled on July 17th.  Report to [facility] Admitting at 11:30 A.M.  Call this number if you have problems the morning of surgery:  [phone]   Remember:  Do not eat or drink after midnight.   Continue all medications as directed by your physician except follow these medication instructions before surgery below.  Follow your surgeon's orders regarding your coumadin and lovenox injections.   7 days prior to surgery STOP taking any Aspirin(unless otherwise instructed by your surgeon), VOLTAREN GEL, Aleve, Naproxen, Ibuprofen, Motrin, Advil, Goody's, BC's, all herbal medications, fish oil, and all vitamins.   Take these medicines the morning of surgery with A SIP OF WATER   acetaminophen (TYLENOL) 500 MG tablet-as needed for pain DULoxetine (CYMBALTA) 20 MG capsule levothyroxine (SYNTHROID, LEVOTHROID) 75 MCG tablet metoprolol (TOPROL-XL) 25 MG 24 hr tablet omeprazole (PRILOSEC) 20 MG capsule    Do not wear jewelry, make-up or nail polish.  Do not wear lotions, powders, or perfumes, or deodorant.  Do not shave 48 hours prior to surgery.    Do not bring valuables to the hospital.  [facility] is not responsible for any belongings or valuables.  Hearing aids, Contacts, eyeglasses, dentures or bridgework may not be worn into surgery.  Leave your suitcase in the car.  After surgery it may be brought to your room.  For patients admitted to the hospital, discharge time will be determined by your treatment team.  Patients discharged the day of surgery will not be allowed to drive home.    - Preparing For Surgery  Before surgery, you can play an important role. Because skin is not sterile, your skin needs to be as free of germs as possible. You can reduce the number of  germs on your skin by washing with CHG (chlorahexidine gluconate) Soap before surgery.  CHG is an antiseptic cleaner which kills germs and bonds with the skin to continue killing germs even after washing.    Oral Hygiene is also important to reduce your risk of infection.  Remember - BRUSH YOUR TEETH THE MORNING OF SURGERY WITH YOUR REGULAR TOOTHPASTE  Please do not use if you have an allergy to CHG or antibacterial soaps. If your skin becomes reddened/irritated stop using the CHG.  Do not shave (including legs and underarms) for at least 48 hours prior to first CHG shower. It is OK to shave your face.  Please follow these instructions carefully.   1. Shower the NIGHT BEFORE SURGERY and the MORNING OF SURGERY with CHG.   2. If you chose to wash your hair, wash your hair first as usual with your normal shampoo.  3. After you shampoo, rinse your hair and body thoroughly to remove the shampoo.  4. Use CHG as you would any other liquid soap. You can apply CHG directly to the skin and wash gently with a scrungie or a clean washcloth.   5. Apply the CHG Soap to your body ONLY FROM THE NECK DOWN.  Do not use on open wounds or open sores. Avoid contact with your eyes, ears, mouth and genitals (private parts). Wash Face and genitals (private parts)  with your normal soap.  6. Wash thoroughly, paying special attention to the area where your surgery will be performed.  7. Thoroughly rinse your body with warm water from the neck down.  8. DO NOT shower/wash with your normal soap after using and rinsing off the CHG Soap.  9. Pat yourself dry with a CLEAN TOWEL.  10. Wear CLEAN PAJAMAS to bed the night before surgery, wear comfortable clothes the morning of surgery  11. Place CLEAN SHEETS on your bed the night of your first shower and DO NOT SLEEP WITH PETS.    Day of Surgery:  Do not apply any deodorants/lotions.  Please wear clean clothes to the hospital/surgery center.   Remember to brush  your teeth WITH YOUR REGULAR TOOTHPASTE.   Please read over the following fact sheets that you were given.

## 2018-06-25 ENCOUNTER — Other Ambulatory Visit: Payer: Self-pay

## 2018-06-25 ENCOUNTER — Encounter (HOSPITAL_COMMUNITY): Payer: Self-pay

## 2018-06-25 ENCOUNTER — Encounter (HOSPITAL_COMMUNITY)
Admission: RE | Admit: 2018-06-25 | Discharge: 2018-06-25 | Disposition: A | Discharge status: Home / Self Care | Payer: Medicare Other | Source: Ambulatory Visit | Attending: Orthopedic Surgery | Admitting: Orthopedic Surgery

## 2018-06-25 DIAGNOSIS — Z01818 Encounter for other preprocedural examination: Secondary | ICD-10-CM | POA: Insufficient documentation

## 2018-06-25 DIAGNOSIS — E039 Hypothyroidism, unspecified: Secondary | ICD-10-CM | POA: Diagnosis not present

## 2018-06-25 DIAGNOSIS — Z7901 Long term (current) use of anticoagulants: Secondary | ICD-10-CM | POA: Diagnosis not present

## 2018-06-25 DIAGNOSIS — F419 Anxiety disorder, unspecified: Secondary | ICD-10-CM | POA: Diagnosis not present

## 2018-06-25 DIAGNOSIS — Z79899 Other long term (current) drug therapy: Secondary | ICD-10-CM | POA: Diagnosis not present

## 2018-06-25 DIAGNOSIS — Z79890 Hormone replacement therapy (postmenopausal): Secondary | ICD-10-CM | POA: Insufficient documentation

## 2018-06-25 DIAGNOSIS — Z87891 Personal history of nicotine dependence: Secondary | ICD-10-CM | POA: Diagnosis not present

## 2018-06-25 DIAGNOSIS — F329 Major depressive disorder, single episode, unspecified: Secondary | ICD-10-CM | POA: Diagnosis not present

## 2018-06-25 DIAGNOSIS — I4891 Unspecified atrial fibrillation: Secondary | ICD-10-CM | POA: Insufficient documentation

## 2018-06-25 DIAGNOSIS — R600 Localized edema: Secondary | ICD-10-CM | POA: Diagnosis not present

## 2018-06-25 DIAGNOSIS — I10 Essential (primary) hypertension: Secondary | ICD-10-CM | POA: Diagnosis not present

## 2018-06-25 DIAGNOSIS — M8448XA Pathological fracture, other site, initial encounter for fracture: Secondary | ICD-10-CM | POA: Diagnosis not present

## 2018-06-25 DIAGNOSIS — K219 Gastro-esophageal reflux disease without esophagitis: Secondary | ICD-10-CM | POA: Insufficient documentation

## 2018-06-25 DIAGNOSIS — Z01812 Encounter for preprocedural laboratory examination: Secondary | ICD-10-CM | POA: Insufficient documentation

## 2018-06-25 DIAGNOSIS — E785 Hyperlipidemia, unspecified: Secondary | ICD-10-CM | POA: Insufficient documentation

## 2018-06-25 DIAGNOSIS — Z6823 Body mass index (BMI) 23.0-23.9, adult: Secondary | ICD-10-CM | POA: Diagnosis not present

## 2018-06-25 DIAGNOSIS — I482 Chronic atrial fibrillation: Secondary | ICD-10-CM | POA: Diagnosis not present

## 2018-06-25 HISTORY — DX: Essential (primary) hypertension: I10

## 2018-06-25 HISTORY — DX: Anxiety disorder, unspecified: F41.9

## 2018-06-25 HISTORY — DX: Major depressive disorder, single episode, unspecified: F32.9

## 2018-06-25 HISTORY — DX: Unspecified osteoarthritis, unspecified site: M19.90

## 2018-06-25 HISTORY — DX: Dyspnea, unspecified: R06.00

## 2018-06-25 HISTORY — DX: Depression, unspecified: F32.A

## 2018-06-25 HISTORY — DX: Frequency of micturition: R35.0

## 2018-06-25 HISTORY — DX: Cardiac arrhythmia, unspecified: I49.9

## 2018-06-25 LAB — BASIC METABOLIC PANEL
Anion gap: 9 (ref 5–15)
BUN: 18 mg/dL (ref 8–23)
CO2: 23 mmol/L (ref 22–32)
Calcium: 9.1 mg/dL (ref 8.9–10.3)
Chloride: 105 mmol/L (ref 98–111)
Creatinine, Ser: 1.35 mg/dL — ABNORMAL HIGH (ref 0.44–1.00)
GFR calc Af Amer: 38 mL/min — ABNORMAL LOW (ref >60)
GFR calc non Af Amer: 33 mL/min — ABNORMAL LOW (ref >60)
Glucose, Bld: 111 mg/dL — ABNORMAL HIGH (ref 70–99)
Potassium: 4 mmol/L (ref 3.5–5.1)
Sodium: 137 mmol/L (ref 135–145)

## 2018-06-25 LAB — CBC
HCT: 38.2 % (ref 36.0–46.0)
Hemoglobin: 12 g/dL (ref 12.0–15.0)
MCH: 30.8 pg (ref 26.0–34.0)
MCHC: 31.4 g/dL (ref 30.0–36.0)
MCV: 98.2 fL (ref 78.0–100.0)
Platelets: 528 10*3/uL — ABNORMAL HIGH (ref 150–400)
RBC: 3.89 MIL/uL (ref 3.87–5.11)
RDW: 15.2 % (ref 11.5–15.5)
WBC: 7.4 10*3/uL (ref 4.0–10.5)

## 2018-06-25 LAB — SURGICAL PCR SCREEN
MRSA, PCR: NEGATIVE
Staphylococcus aureus: NEGATIVE

## 2018-06-25 NOTE — Progress Notes (Addendum)
Dr. Claiborne Billings   Cardiologist   Last visit 06-08-2018  PCP Dr. Wende Neighbors   Pt. To stop coumadin today 06-25-2018 and begin Lovenox injection today 06-25-2018 per pt. Son.

## 2018-06-28 NOTE — Progress Notes (Signed)
Anesthesia Chart Review:  Case:  [id number] Date/Time:  06/30/18 1315   Procedure:  KYPHOPLASTY L1 and T12 (N/A ) - 90 mins   Anesthesia type:  General   Pre-op diagnosis:  L1 and T12 pathologic compression Fracture   Location:  [facility] OR ROOM 04 / [place] [facility]   Surgeon:  Melina Schools, MD      DISCUSSION: 84 yo former smoker scheduled for above procedure. Pertinent medical hx includes Hypothyroid, Leslie (on warfarin), Leslie Rosario, Leslie Rosario, Leslie Rosario, Depression.  Pt was seen by her cardiologist Dr. Claiborne Billings on 06/08/18 and discussed possibility that she may require kyphoplasty. In his note he stated " I reviewed her most recent echo from September 2018 which was done at [facility].  This showed an EF of 60 to 65%.  There was aortic valve sclerosis without stenosis, mild mitral regurgitation, biatrial enlargement, and moderate pulmonary hypertension.  Presently, she feels sluggish but denies any chest pain.  She denies PND orthopnea.  There is no presyncope or syncope.  He has a compression fracture of her thoracic spine.  If she is to undergo future kyphoplasty surgery she will need to hold warfarin for her procedure.  Dr.Hall will be rechecking blood work I will asked that these be sent to me for my review.  I will see her in 1 year for reevaluation. "  Pt. to stop coumadin 06-25-2018 and begin Lovenox injection today 06-25-2018 per pt. son.   Anticipate she can proceed with surgery as scheduled barring acute status change  VS: BP (!) 140/59   Pulse 71   Temp 36.4 C   Resp 20   Ht 5' (1.524 m)   Wt 133 lb 14.4 oz (60.7 kg)   SpO2 95%   BMI 26.15 kg/m   PROVIDERS: Celene Squibb, MD is PCP  Troy Sine, MD is Cardiologist last seen 06/08/2018   LABS: Labs reviewed: Acceptable for surgery. (all labs ordered are listed, but only abnormal results are displayed)  Labs Reviewed  BASIC METABOLIC PANEL - Abnormal; Notable for the following components:      Result Value   Glucose, Bld 111 (*)    Creatinine, Ser 1.35 (*)    GFR calc non Af Amer 33 (*)    GFR calc Af Amer 38 (*)    All other components within normal limits  CBC - Abnormal; Notable for the following components:   Platelets 528 (*)    All other components within normal limits  SURGICAL PCR SCREEN     IMAGES: CHEST 1 VIEW 09/12/2017  FINDINGS: Stable cardiomegaly mediastinal contours with aortic atherosclerosis. Diffuse peribronchial and interstitial thickening consistent with pulmonary edema. Small left pleural effusion suspected. No confluent airspace disease. No pneumothorax.  IMPRESSION: Cardiomegaly pulmonary edema and probable small left pleural effusion, suspicious for CHF.  Clinical Data: Palpitations.  Upper respiratory infection.  CHEST - 2 VIEW 07/28/2012  Comparison: 07/18/2012  Findings: New airspace disease is seen at the left lung base with associated small left pleural effusion.  The cardiopericardial silhouette is enlarged. Interstitial markings are diffusely coarsened with chronic features. Imaged bony structures of the thorax are intact.  IMPRESSION: New airspace disease at the left base with small left pleural effusion.  Imaging features are consistent with pneumonia.  Follow- up imaging is recommended to ensure resolution.  Cardiomegaly with underlying chronic interstitial lung disease.  EKG: 06/08/2018: Leslie, nonspecific ST abnormality.   CV: Carotid US 09/13/2017 IMPRESSION: Very minimal amount of bilateral intimal thickening and atherosclerotic  plaque, not resulting in a hemodynamically significant stenosis within either internal carotid artery.  ECHO 09/13/2017 - Left ventricle: The cavity size was normal. Wall thickness was   normal. Systolic function was normal. The estimated ejection   fraction was in the range of 60% to 65%. Wall motion was normal;   there were no regional wall motion abnormalities. - Aortic valve: Mildly calcified annulus. Mildly  thickened   leaflets. Valve area (VTI): 1.52 cm^2. Valve area (Vmax): 1.5   cm^2. Valve area (Vmean): 1.25 cm^2. - Mitral valve: There was mild regurgitation. - Left atrium: The atrium was moderately dilated. - Right ventricle: The cavity size was mildly to moderately   dilated. - Right atrium: The atrium was moderately dilated. - Atrial septum: There was a patent foramen ovale. There is a small   left to right shunt. - Pulmonary arteries: Systolic pressure was moderately increased.   PA peak pressure: 56 mm Hg (S).  Past Medical History:  Diagnosis Date  . Leslie Rosario   . Arthritis   . Atrial fibrillation ([place])   . Constipation   . Depression   . Dyspnea   . Dysrhythmia    A-Fib  . Leslie Rosario (gastroesophageal reflux disease)   . Hyperlipidemia   . Hypertension   . Hypothyroidism   . Nausea & vomiting   . T12 compression fracture ([place]) 06/2017  . Urinary frequency     Past Surgical History:  Procedure Laterality Date  . COLONOSCOPY    . NM MYOCAR PERF WALL MOTION  04/2006   dipyridamole; normal pattern of perfusion to all regions, post-stress EF 73%  . TONSILLECTOMY    . TRANSTHORACIC ECHOCARDIOGRAM  04/2012   EF=>55%, borderline conc LVH; LA mod dilated; RA mildly dilated; mild mitral annular calcif, mod MR; mild-mod TR with elevated RSVP 30-25mmHg, mild pulm Leslie Rosario; mild calcif of AV leaflets, AV mildly sclerotic; aortic root sclerosis/calcif  . UPPER GASTROINTESTINAL ENDOSCOPY      MEDICATIONS: . acetaminophen (TYLENOL) 500 MG tablet  . ALPRAZolam (XANAX) 0.5 MG tablet  . bisacodyl (DULCOLAX) 10 MG suppository  . calcitonin, salmon, (MIACALCIN/FORTICAL) 200 UNIT/ACT nasal spray  . diclofenac sodium (VOLTAREN) 1 % GEL  . DULoxetine (CYMBALTA) 20 MG capsule  . enoxaparin (LOVENOX) 80 MG/0.8ML injection  . levothyroxine (SYNTHROID, LEVOTHROID) 75 MCG tablet  . lidocaine (LIDODERM) 5 %  . metoprolol (TOPROL-XL) 25 MG 24 hr tablet  . omeprazole (PRILOSEC) 20 MG capsule  .  simvastatin (ZOCOR) 20 MG tablet  . warfarin (COUMADIN) 3 MG tablet   No current facility-administered medications for this encounter.      Wynonia Musty [facility] Short Stay Center/Anesthesiology Phone [phone] 06/28/2018 10:14 AM

## 2018-06-30 ENCOUNTER — Ambulatory Visit (HOSPITAL_COMMUNITY): Payer: Medicare Other | Admitting: Anesthesiology

## 2018-06-30 ENCOUNTER — Encounter (HOSPITAL_COMMUNITY): Payer: Self-pay

## 2018-06-30 ENCOUNTER — Ambulatory Visit (HOSPITAL_COMMUNITY): Payer: Medicare Other

## 2018-06-30 ENCOUNTER — Observation Stay (HOSPITAL_COMMUNITY)
Admission: RE | Admit: 2018-06-30 | Discharge: 2018-07-01 | Disposition: A | Discharge status: Home / Self Care | Payer: Medicare Other | Source: Ambulatory Visit | Attending: Orthopedic Surgery | Admitting: Orthopedic Surgery

## 2018-06-30 ENCOUNTER — Ambulatory Visit (HOSPITAL_COMMUNITY): Payer: Medicare Other | Admitting: Emergency Medicine

## 2018-06-30 ENCOUNTER — Encounter (HOSPITAL_COMMUNITY)
Admission: RE | Disposition: A | Discharge status: Home / Self Care | Payer: Self-pay | Source: Ambulatory Visit | Attending: Orthopedic Surgery

## 2018-06-30 DIAGNOSIS — K219 Gastro-esophageal reflux disease without esophagitis: Secondary | ICD-10-CM | POA: Insufficient documentation

## 2018-06-30 DIAGNOSIS — M4854XA Collapsed vertebra, not elsewhere classified, thoracic region, initial encounter for fracture: Secondary | ICD-10-CM | POA: Diagnosis not present

## 2018-06-30 DIAGNOSIS — S32010A Wedge compression fracture of first lumbar vertebra, initial encounter for closed fracture: Secondary | ICD-10-CM | POA: Diagnosis not present

## 2018-06-30 DIAGNOSIS — M8088XA Other osteoporosis with current pathological fracture, vertebra(e), initial encounter for fracture: Principal | ICD-10-CM | POA: Insufficient documentation

## 2018-06-30 DIAGNOSIS — Z888 Allergy status to other drugs, medicaments and biological substances status: Secondary | ICD-10-CM | POA: Insufficient documentation

## 2018-06-30 DIAGNOSIS — F329 Major depressive disorder, single episode, unspecified: Secondary | ICD-10-CM | POA: Diagnosis not present

## 2018-06-30 DIAGNOSIS — Z882 Allergy status to sulfonamides status: Secondary | ICD-10-CM | POA: Diagnosis not present

## 2018-06-30 DIAGNOSIS — Z79899 Other long term (current) drug therapy: Secondary | ICD-10-CM | POA: Diagnosis not present

## 2018-06-30 DIAGNOSIS — Z885 Allergy status to narcotic agent status: Secondary | ICD-10-CM | POA: Insufficient documentation

## 2018-06-30 DIAGNOSIS — Z9889 Other specified postprocedural states: Secondary | ICD-10-CM

## 2018-06-30 DIAGNOSIS — I10 Essential (primary) hypertension: Secondary | ICD-10-CM | POA: Diagnosis not present

## 2018-06-30 DIAGNOSIS — E039 Hypothyroidism, unspecified: Secondary | ICD-10-CM | POA: Insufficient documentation

## 2018-06-30 DIAGNOSIS — Z7901 Long term (current) use of anticoagulants: Secondary | ICD-10-CM | POA: Insufficient documentation

## 2018-06-30 DIAGNOSIS — R2681 Unsteadiness on feet: Secondary | ICD-10-CM | POA: Diagnosis not present

## 2018-06-30 DIAGNOSIS — F419 Anxiety disorder, unspecified: Secondary | ICD-10-CM | POA: Insufficient documentation

## 2018-06-30 DIAGNOSIS — E785 Hyperlipidemia, unspecified: Secondary | ICD-10-CM | POA: Insufficient documentation

## 2018-06-30 DIAGNOSIS — S22080A Wedge compression fracture of T11-T12 vertebra, initial encounter for closed fracture: Secondary | ICD-10-CM | POA: Diagnosis not present

## 2018-06-30 DIAGNOSIS — I4891 Unspecified atrial fibrillation: Secondary | ICD-10-CM | POA: Insufficient documentation

## 2018-06-30 DIAGNOSIS — Z981 Arthrodesis status: Secondary | ICD-10-CM | POA: Diagnosis not present

## 2018-06-30 DIAGNOSIS — Z419 Encounter for procedure for purposes other than remedying health state, unspecified: Secondary | ICD-10-CM

## 2018-06-30 HISTORY — PX: KYPHOPLASTY: SHX5884

## 2018-06-30 LAB — APTT: aPTT: 42 seconds — ABNORMAL HIGH (ref 24–36)

## 2018-06-30 LAB — PROTIME-INR
INR: 1.2
Prothrombin Time: 15.1 seconds (ref 11.4–15.2)

## 2018-06-30 SURGERY — KYPHOPLASTY
Anesthesia: Monitor Anesthesia Care | Site: Back

## 2018-06-30 MED ORDER — DOCUSATE SODIUM 100 MG PO CAPS
100.0000 mg | ORAL_CAPSULE | Freq: Two times a day (BID) | ORAL | Status: DC
  Administered 2018-06-30: 100 mg via ORAL
  Filled 2018-06-30: qty 1

## 2018-06-30 MED ORDER — MENTHOL 3 MG MT LOZG: 1.0000 | LOZENGE | OROMUCOSAL | Status: DC | PRN

## 2018-06-30 MED ORDER — PHENOL 1.4 % MT LIQD: 1.0000 | OROMUCOSAL | Status: DC | PRN

## 2018-06-30 MED ORDER — TRAMADOL HCL 50 MG PO TABS
50.0000 mg | ORAL_TABLET | Freq: Four times a day (QID) | ORAL | 0 refills | Status: DC
Start: 2018-06-30 — End: 2018-08-24

## 2018-06-30 MED ORDER — ACETAMINOPHEN 650 MG RE SUPP: 650.0000 mg | RECTAL | Status: DC | PRN

## 2018-06-30 MED ORDER — IOPAMIDOL (ISOVUE-300) INJECTION 61%
INTRAVENOUS | Status: AC
  Filled 2018-06-30: qty 50

## 2018-06-30 MED ORDER — SODIUM CHLORIDE 0.9 % IJ SOLN
INTRAMUSCULAR | Status: AC
Start: 2018-06-30 — End: ?
  Filled 2018-06-30: qty 10

## 2018-06-30 MED ORDER — ONDANSETRON HCL 4 MG/2ML IJ SOLN
INTRAMUSCULAR | Status: AC
  Filled 2018-06-30: qty 2

## 2018-06-30 MED ORDER — MAGNESIUM CITRATE PO SOLN: 1.0000 | Freq: Once | ORAL | Status: DC | PRN

## 2018-06-30 MED ORDER — METOPROLOL SUCCINATE ER 25 MG PO TB24: 12.5000 mg | ORAL_TABLET | Freq: Every day | ORAL | Status: DC

## 2018-06-30 MED ORDER — ALPRAZOLAM 0.5 MG PO TABS: 0.5000 mg | ORAL_TABLET | Freq: Every day | ORAL | Status: DC

## 2018-06-30 MED ORDER — ACETAMINOPHEN 10 MG/ML IV SOLN
INTRAVENOUS | Status: DC | PRN
  Administered 2018-06-30: 1000 mg via INTRAVENOUS

## 2018-06-30 MED ORDER — PROPOFOL 10 MG/ML IV BOLUS
INTRAVENOUS | Status: AC
  Filled 2018-06-30: qty 20

## 2018-06-30 MED ORDER — BUPIVACAINE LIPOSOME 1.3 % IJ SUSP
20.0000 mL | INTRAMUSCULAR | Status: AC
  Administered 2018-06-30: 20 mL
  Filled 2018-06-30: qty 20

## 2018-06-30 MED ORDER — IOPAMIDOL (ISOVUE-300) INJECTION 61%
INTRAVENOUS | Status: DC | PRN
  Administered 2018-06-30: 100 mL

## 2018-06-30 MED ORDER — SIMVASTATIN 20 MG PO TABS: 10.0000 mg | ORAL_TABLET | Freq: Every day | ORAL | Status: DC

## 2018-06-30 MED ORDER — SODIUM CHLORIDE 0.9% FLUSH: 3.0000 mL | Freq: Two times a day (BID) | INTRAVENOUS | Status: DC

## 2018-06-30 MED ORDER — FENTANYL CITRATE (PF) 100 MCG/2ML IJ SOLN
25.0000 ug | INTRAMUSCULAR | Status: DC | PRN
  Administered 2018-06-30 (×2): 25 ug via INTRAVENOUS
  Administered 2018-06-30: 50 ug via INTRAVENOUS

## 2018-06-30 MED ORDER — SUCCINYLCHOLINE CHLORIDE 200 MG/10ML IV SOSY
PREFILLED_SYRINGE | INTRAVENOUS | Status: AC
  Filled 2018-06-30: qty 10

## 2018-06-30 MED ORDER — FENTANYL CITRATE (PF) 250 MCG/5ML IJ SOLN
INTRAMUSCULAR | Status: AC
  Filled 2018-06-30: qty 5

## 2018-06-30 MED ORDER — CEFAZOLIN SODIUM-DEXTROSE 2-4 GM/100ML-% IV SOLN
2.0000 g | Freq: Two times a day (BID) | INTRAVENOUS | Status: DC
  Administered 2018-06-30: 2 g via INTRAVENOUS
  Filled 2018-06-30: qty 100

## 2018-06-30 MED ORDER — PANTOPRAZOLE SODIUM 40 MG PO TBEC: 40.0000 mg | DELAYED_RELEASE_TABLET | Freq: Every day | ORAL | Status: DC

## 2018-06-30 MED ORDER — ONDANSETRON HCL 4 MG/2ML IJ SOLN
INTRAMUSCULAR | Status: DC | PRN
  Administered 2018-06-30: 4 mg via INTRAVENOUS

## 2018-06-30 MED ORDER — EPHEDRINE SULFATE 50 MG/ML IJ SOLN
INTRAMUSCULAR | Status: AC
  Filled 2018-06-30: qty 3

## 2018-06-30 MED ORDER — BUPIVACAINE-EPINEPHRINE 0.25% -1:200000 IJ SOLN
INTRAMUSCULAR | Status: DC | PRN
  Administered 2018-06-30: 30 mL

## 2018-06-30 MED ORDER — 0.9 % SODIUM CHLORIDE (POUR BTL) OPTIME
TOPICAL | Status: DC | PRN
  Administered 2018-06-30: 1000 mL

## 2018-06-30 MED ORDER — ONDANSETRON HCL 4 MG PO TABS: 4.0000 mg | ORAL_TABLET | Freq: Four times a day (QID) | ORAL | Status: DC | PRN

## 2018-06-30 MED ORDER — LEVOTHYROXINE SODIUM 75 MCG PO TABS
75.0000 ug | ORAL_TABLET | Freq: Every day | ORAL | Status: DC
  Administered 2018-07-01: 75 ug via ORAL
  Filled 2018-06-30: qty 1

## 2018-06-30 MED ORDER — ACETAMINOPHEN 10 MG/ML IV SOLN
INTRAVENOUS | Status: AC
Start: 2018-06-30 — End: ?
  Filled 2018-06-30: qty 100

## 2018-06-30 MED ORDER — TRAMADOL HCL 50 MG PO TABS
50.0000 mg | ORAL_TABLET | Freq: Four times a day (QID) | ORAL | Status: DC
  Administered 2018-06-30: 50 mg via ORAL
  Filled 2018-06-30: qty 1

## 2018-06-30 MED ORDER — FENTANYL CITRATE (PF) 100 MCG/2ML IJ SOLN
INTRAMUSCULAR | Status: AC
  Filled 2018-06-30: qty 2

## 2018-06-30 MED ORDER — PROPOFOL 500 MG/50ML IV EMUL
INTRAVENOUS | Status: DC | PRN
  Administered 2018-06-30: 50 ug/kg/min via INTRAVENOUS

## 2018-06-30 MED ORDER — SODIUM CHLORIDE 0.9% FLUSH: 3.0000 mL | INTRAVENOUS | Status: DC | PRN

## 2018-06-30 MED ORDER — CEFAZOLIN SODIUM-DEXTROSE 2-4 GM/100ML-% IV SOLN
2.0000 g | INTRAVENOUS | Status: AC
  Administered 2018-06-30: 2 g via INTRAVENOUS
  Filled 2018-06-30: qty 100

## 2018-06-30 MED ORDER — DULOXETINE HCL 20 MG PO CPEP
20.0000 mg | ORAL_CAPSULE | Freq: Every day | ORAL | Status: DC
  Filled 2018-06-30: qty 1

## 2018-06-30 MED ORDER — LACTATED RINGERS IV SOLN
Freq: Once | INTRAVENOUS | Status: AC
Start: 2018-06-30 — End: 2018-06-30
  Administered 2018-06-30: 11:00:00 via INTRAVENOUS

## 2018-06-30 MED ORDER — SODIUM CHLORIDE 0.9 % IV SOLN: 250.0000 mL | INTRAVENOUS | Status: DC

## 2018-06-30 MED ORDER — LACTATED RINGERS IV SOLN
INTRAVENOUS | Status: DC | PRN
  Administered 2018-06-30: 11:00:00 via INTRAVENOUS

## 2018-06-30 MED ORDER — ACETAMINOPHEN 325 MG PO TABS
650.0000 mg | ORAL_TABLET | ORAL | Status: DC | PRN
  Administered 2018-06-30 – 2018-07-01 (×2): 650 mg via ORAL
  Filled 2018-06-30 (×2): qty 2

## 2018-06-30 MED ORDER — BUPIVACAINE-EPINEPHRINE (PF) 0.25% -1:200000 IJ SOLN
INTRAMUSCULAR | Status: AC
  Filled 2018-06-30: qty 30

## 2018-06-30 MED ORDER — ONDANSETRON HCL 4 MG/2ML IJ SOLN: 4.0000 mg | Freq: Four times a day (QID) | INTRAMUSCULAR | Status: DC | PRN

## 2018-06-30 MED ORDER — ONDANSETRON 4 MG PO TBDP: 4.0000 mg | ORAL_TABLET | Freq: Three times a day (TID) | ORAL | 0 refills | Status: DC | PRN

## 2018-06-30 MED ORDER — LACTATED RINGERS IV SOLN: INTRAVENOUS | Status: DC

## 2018-06-30 MED ORDER — POLYETHYLENE GLYCOL 3350 17 G PO PACK: 17.0000 g | PACK | Freq: Every day | ORAL | Status: DC | PRN

## 2018-06-30 SURGICAL SUPPLY — 43 items
BANDAGE ADH SHEER 1  50/CT (GAUZE/BANDAGES/DRESSINGS) ×2 IMPLANT
BLADE SURG 15 STRL LF DISP TIS (BLADE) ×1 IMPLANT
BLADE SURG 15 STRL SS (BLADE) ×1
BONE FILLER DEVICE STRL SZ3 (INSTRUMENTS) IMPLANT
CEMENT KYPHON C01A KIT/MIXER (Cement) ×2 IMPLANT
CEMENT KYPHON CX01A KIT/MIXER (Cement) ×2 IMPLANT
COVER LIGHT HANDLE  DEROYL (MISCELLANEOUS) ×2 IMPLANT
CURETTE WEDGE 8.5MM KYPHX (MISCELLANEOUS) IMPLANT
DERMABOND ADVANCED (GAUZE/BANDAGES/DRESSINGS) ×1
DERMABOND ADVANCED .7 DNX12 (GAUZE/BANDAGES/DRESSINGS) ×1 IMPLANT
DRAPE C-ARM 42X72 X-RAY (DRAPES) ×4 IMPLANT
DRAPE INCISE IOBAN 66X45 STRL (DRAPES) ×2 IMPLANT
DRAPE LAPAROTOMY T 102X78X121 (DRAPES) ×2 IMPLANT
DRAPE SURG 17X23 STRL (DRAPES) IMPLANT
DRAPE U-SHAPE 47X51 STRL (DRAPES) IMPLANT
DRAPE WARM FLUID 44X44 (DRAPE) ×2 IMPLANT
DURAPREP 26ML APPLICATOR (WOUND CARE) ×2 IMPLANT
GLOVE BIO SURGEON STRL SZ 6.5 (GLOVE) IMPLANT
GLOVE BIOGEL PI IND STRL 6.5 (GLOVE) IMPLANT
GLOVE BIOGEL PI IND STRL 8.5 (GLOVE) ×1 IMPLANT
GLOVE BIOGEL PI INDICATOR 6.5 (GLOVE)
GLOVE BIOGEL PI INDICATOR 8.5 (GLOVE) ×1
GLOVE SS BIOGEL STRL SZ 8.5 (GLOVE) ×1 IMPLANT
GLOVE SUPERSENSE BIOGEL SZ 8.5 (GLOVE) ×1
GOWN STRL REUS W/ TWL LRG LVL3 (GOWN DISPOSABLE) ×1 IMPLANT
GOWN STRL REUS W/TWL 2XL LVL3 (GOWN DISPOSABLE) ×2 IMPLANT
GOWN STRL REUS W/TWL LRG LVL3 (GOWN DISPOSABLE) ×1
KIT BASIN OR (CUSTOM PROCEDURE TRAY) ×2 IMPLANT
KIT TURNOVER KIT B (KITS) ×2 IMPLANT
NEEDLE 21X1 OR PACK (NEEDLE) IMPLANT
NEEDLE HYPO 25GX1X1/2 BEV (NEEDLE) ×2 IMPLANT
NEEDLE SPNL 18GX3.5 QUINCKE PK (NEEDLE) ×4 IMPLANT
NS IRRIG 1000ML POUR BTL (IV SOLUTION) ×2 IMPLANT
PACK SURGICAL SETUP 50X90 (CUSTOM PROCEDURE TRAY) ×2 IMPLANT
PACK UNIVERSAL I (CUSTOM PROCEDURE TRAY) ×2 IMPLANT
PAD ARMBOARD 7.5X6 YLW CONV (MISCELLANEOUS) ×4 IMPLANT
SPONGE LAP 4X18 RFD (DISPOSABLE) ×2 IMPLANT
SUT MNCRL AB 3-0 PS2 18 (SUTURE) ×2 IMPLANT
SYR CONTROL 10ML LL (SYRINGE) ×2 IMPLANT
TOWEL OR 17X26 10 PK STRL BLUE (TOWEL DISPOSABLE) ×2 IMPLANT
TRAY KYPHOPAK 15/3 ONESTEP 1ST (MISCELLANEOUS) ×4 IMPLANT
TRAY KYPHOPAK 20/3 ONESTEP 1ST (MISCELLANEOUS) IMPLANT
WATER STERILE IRR 1000ML POUR (IV SOLUTION) ×2 IMPLANT

## 2018-06-30 NOTE — Brief Op Note (Signed)
06/30/2018  1:23 PM  PATIENT:  Leslie Rosario  84 y.o. female  PRE-OPERATIVE DIAGNOSIS:  L1 and T12 pathologic compression Fracture  POST-OPERATIVE DIAGNOSIS:  L1 and T12 pathologic compression Fracture  PROCEDURE:  Procedure(s) with comments: KYPHOPLASTY L1 and T12 (N/A) - 90 mins  SURGEON:  Surgeon(s) and Role:    Melina Schools, MD - Primary  PHYSICIAN ASSISTANT:   ASSISTANTS: none   ANESTHESIA:   IV sedation  EBL:  none  BLOOD ADMINISTERED:none  DRAINS: none   LOCAL MEDICATIONS USED:  MARCAINE    and OTHER exparel  SPECIMEN:  No Specimen  DISPOSITION OF SPECIMEN:  N/A  COUNTS:  YES  TOURNIQUET:  * No tourniquets in log *  DICTATION: .Dragon Dictation  PLAN OF CARE: Admit for overnight observation  PATIENT DISPOSITION:  PACU - hemodynamically stable.

## 2018-06-30 NOTE — Evaluation (Signed)
Physical Therapy Evaluation Patient Details Name: Leslie Rosario MRN: [id number] DOB: 1923-01-15 Today's Date: 06/30/2018   History of Present Illness  Pt is a 84 y/o female s/p L1 and T12 Kyphoplasty. PMH includes a fib.   Clinical Impression  Patient is s/p above surgery resulting in the deficits listed below (see PT Problem List). Pt requiring min to min guard A for mobility using RW this session. Educated about back precautions and required continuous reminders to maintain throughout session. Educated about generalized walking program to perform at home and importance of use of RW. Patient will benefit from skilled PT to increase their independence and safety with mobility (while adhering to their precautions) to allow discharge to the venue listed below.     Follow Up Recommendations Home health PT;Supervision for mobility/OOB    Equipment Recommendations  3in1 (PT)    Recommendations for Other Services OT consult     Precautions / Restrictions Precautions Precautions: Back Precaution Booklet Issued: Yes (comment) Precaution Comments: Reviewed back precautions with pt and family, however, required persistent cues to ensure she followed precautions.  Restrictions Weight Bearing Restrictions: No      Mobility  Bed Mobility Overal bed mobility: Needs Assistance Bed Mobility: Rolling;Sidelying to Sit;Sit to Sidelying Rolling: Min assist Sidelying to sit: Min assist     Sit to sidelying: Min assist General bed mobility comments: Min A for assist with rolling and trunk elevation to come up to sitting. Also required assist to scoot hips to EOB. Min A for LE assist for return to sidelying. Required cues for log roll technique.   Transfers Overall transfer level: Needs assistance Equipment used: Rolling walker (2 wheeled) Transfers: Sit to/from Stand Sit to Stand: Min assist         General transfer comment: Min A for lift assist and steadying. Verbal cues for safe hand  placement.   Ambulation/Gait Ambulation/Gait assistance: Min assist;Min guard Gait Distance (Feet): 100 Feet Assistive device: Rolling walker (2 wheeled) Gait Pattern/deviations: Step-through pattern;Decreased stride length;Shuffle;Trunk flexed Gait velocity: Decreased    General Gait Details: Slow, slightly unsteady gait requiring min to min guard A for steadying. Required cues for upright posture and for proximity to device. Also required cues to increase step height as pt was shuffling BLE. Educated about use of RW at home to increase safety and about generalized walking program to perform at home.   Stairs            Wheelchair Mobility    Modified Rankin (Stroke Patients Only)       Balance Overall balance assessment: Needs assistance Sitting-balance support: No upper extremity supported;Feet supported Sitting balance-Leahy Scale: Fair     Standing balance support: Bilateral upper extremity supported;During functional activity Standing balance-Leahy Scale: Poor Standing balance comment: Reliant on UE support                              Pertinent Vitals/Pain Pain Assessment: Faces Faces Pain Scale: Hurts even more Pain Location: back  Pain Descriptors / Indicators: Operative site guarding;Sore Pain Intervention(s): Limited activity within patient's tolerance;Monitored during session;Repositioned    Home Living Family/patient expects to be discharged to:: Private residence Living Arrangements: Children Available Help at Discharge: Family;Available PRN/intermittently Type of Home: House Home Access: Stairs to enter Entrance Stairs-Rails: Left Entrance Stairs-Number of Steps: 3 Home Layout: One level Home Equipment: Walker - 2 wheels;Cane - single point      Prior Function Level of  Independence: Independent               Hand Dominance        Extremity/Trunk Assessment   Upper Extremity Assessment Upper Extremity Assessment: Defer  to OT evaluation    Lower Extremity Assessment Lower Extremity Assessment: Generalized weakness    Cervical / Trunk Assessment Cervical / Trunk Assessment: Other exceptions Cervical / Trunk Exceptions: s/p kyphoplasty   Communication   Communication: No difficulties  Cognition Arousal/Alertness: Awake/alert Behavior During Therapy: WFL for tasks assessed/performed Overall Cognitive Status: Within Functional Limits for tasks assessed                                        General Comments General comments (skin integrity, edema, etc.): Pt's son present during session.     Exercises     Assessment/Plan    PT Assessment Patient needs continued PT services  PT Problem List Decreased strength;Decreased balance;Decreased mobility;Decreased knowledge of use of DME;Decreased knowledge of precautions;Pain       PT Treatment Interventions DME instruction;Gait training;Stair training;Functional mobility training;Therapeutic activities;Therapeutic exercise;Balance training;Patient/family education    PT Goals (Current goals can be found in the Care Plan section)  Acute Rehab PT Goals Patient Stated Goal: to go home  PT Goal Formulation: With patient Time For Goal Achievement: 07/14/18 Potential to Achieve Goals: Good    Frequency Min 5X/week   Barriers to discharge        Co-evaluation               AM-PAC PT "6 Clicks" Daily Activity  Outcome Measure Difficulty turning over in bed (including adjusting bedclothes, sheets and blankets)?: Unable Difficulty moving from lying on back to sitting on the side of the bed? : Unable Difficulty sitting down on and standing up from a chair with arms (e.g., wheelchair, bedside commode, etc,.)?: Unable Help needed moving to and from a bed to chair (including a wheelchair)?: A Little Help needed walking in hospital room?: A Little Help needed climbing 3-5 steps with a railing? : A Lot 6 Click Score: 11    End of  Session Equipment Utilized During Treatment: Gait belt Activity Tolerance: Patient tolerated treatment well Patient left: in bed;with call bell/phone within reach;with family/visitor present Nurse Communication: Mobility status PT Visit Diagnosis: Unsteadiness on feet (R26.81);Other abnormalities of gait and mobility (R26.89);Pain Pain - part of body: (back )    Time: 2263-3354 PT Time Calculation (min) (ACUTE ONLY): 26 min   Charges:   PT Evaluation $PT Eval Low Complexity: 1 Low PT Treatments $Gait Training: 8-22 mins   PT G Codes:        Leighton Ruff, PT, DPT  Acute Rehabilitation Services  Pager: [phone]   Rudean Hitt 06/30/2018, 6:45 PM

## 2018-06-30 NOTE — Anesthesia Preprocedure Evaluation (Addendum)
Anesthesia Evaluation  Patient identified by MRN, date of birth, ID band Patient awake    Reviewed: Allergy & Precautions, NPO status , Patient's Chart, lab work & pertinent test results, reviewed documented beta blocker date and time   Airway Mallampati: II  TM Distance: >3 FB Neck ROM: Full    Dental  (+) Teeth Intact, Dental Advisory Given, Chipped,    Pulmonary former smoker,     + decreased breath sounds      Cardiovascular hypertension, Pt. on home beta blockers and Pt. on medications + dysrhythmias Atrial Fibrillation  Rhythm:Regular Rate:Normal     Neuro/Psych PSYCHIATRIC DISORDERS Anxiety Depression negative neurological ROS     GI/Hepatic Neg liver ROS, GERD  Medicated and Controlled,  Endo/Other  Hypothyroidism   Renal/GU negative Renal ROS     Musculoskeletal  (+) Arthritis , Osteoarthritis,    Abdominal Normal abdominal exam  (+)   Peds  Hematology negative hematology ROS (+)   Anesthesia Other Findings - HLD  Reproductive/Obstetrics                           Anesthesia Physical Anesthesia Plan  ASA: IV  Anesthesia Plan: MAC   Post-op Pain Management:    Induction: Intravenous  PONV Risk Score and Plan: 4 or greater and Treatment may vary due to age or medical condition, Propofol infusion and Ondansetron  Airway Management Planned: Nasal Cannula  Additional Equipment: None  Intra-op Plan:   Post-operative Plan:   Informed Consent: I have reviewed the patients History and Physical, chart, labs and discussed the procedure including the risks, benefits and alternatives for the proposed anesthesia with the patient or authorized representative who has indicated his/her understanding and acceptance.   Dental advisory given  Plan Discussed with: CRNA, Surgeon and Anesthesiologist  Anesthesia Plan Comments:       Lab Results  Component Value Date   WBC 7.4  06/25/2018   HGB 12.0 06/25/2018   HCT 38.2 06/25/2018   MCV 98.2 06/25/2018   PLT 528 (H) 06/25/2018   Lab Results  Component Value Date   CREATININE 1.35 (H) 06/25/2018   BUN 18 06/25/2018   NA 137 06/25/2018   K 4.0 06/25/2018   CL 105 06/25/2018   CO2 23 06/25/2018   Lab Results  Component Value Date   INR 2.09 09/14/2017   INR 2.24 09/12/2017   INR 2.5 05/03/2013   EKG: atrial fibrillation.  Echo: - Left ventricle: The cavity size was normal. Wall thickness was   normal. Systolic function was normal. The estimated ejection   fraction was in the range of 60% to 65%. Wall motion was normal;   there were no regional wall motion abnormalities. - Aortic valve: Mildly calcified annulus. Mildly thickened   leaflets. Valve area (VTI): 1.52 cm^2. Valve area (Vmax): 1.5   cm^2. Valve area (Vmean): 1.25 cm^2. - Mitral valve: There was mild regurgitation. - Left atrium: The atrium was moderately dilated. - Right ventricle: The cavity size was mildly to moderately   dilated. - Right atrium: The atrium was moderately dilated. - Atrial septum: There was a patent foramen ovale. There is a small   left to right shunt. - Pulmonary arteries: Systolic pressure was moderately increased.   PA peak pressure: 56 mm Hg (S).  Anesthesia Quick Evaluation

## 2018-06-30 NOTE — Transfer of Care (Signed)
Immediate Anesthesia Transfer of Care Note  Patient: Leslie Rosario  Procedure(s) Performed: KYPHOPLASTY L1 and T12 (N/A Back)  Patient Location: PACU  Anesthesia Type:MAC  Level of Consciousness: awake, alert , oriented and patient cooperative  Airway & Oxygen Therapy: Patient Spontanous Breathing and Patient connected to nasal cannula oxygen  Post-op Assessment: Report given to RN, Post -op Vital signs reviewed and stable and Patient moving all extremities X 4  Post vital signs: Reviewed and stable  Last Vitals:  Vitals Value Taken Time  BP 149/71 06/30/2018  1:23 PM  Temp    Pulse 97 06/30/2018  1:27 PM  Resp 20 06/30/2018  1:27 PM  SpO2 95 % 06/30/2018  1:27 PM  Vitals shown include unvalidated device data.  Last Pain:  Vitals:   06/30/18 1323  TempSrc:   PainSc: (P) 0-No pain         Complications: No apparent anesthesia complications

## 2018-06-30 NOTE — Op Note (Signed)
Operative report  Preoperative diagnosis: T12 and L1 osteoporotic compression fractures  Postoperative diagnosis: Same  Operative procedure: Kyphoplasty L89 and L1  Complications: None  Indications: This is a very pleasant 84 year old woman with significant lower thoracic upper lumbar pain.  MRI and x-rays demonstrated acute to subacute compression deformity fractures of T12 and L1.  Due to the failure of conservative management we elected to move forward with surgery.  All appropriate risks benefits and alternatives were discussed with the patient and her family and consent was obtained.  Operative report: Patient was brought the operating room and placed prone on the surgical table.  When she was made comfortable IV sedation was provided and the back was prepped and draped in a standard fashion.  Timeout was taken to confirm patient procedure and all other important data.  Once this was done x-ray was then sterilely brought into the field and identified the T12 and L1 vertebral bodies in the AP and lateral plane.  I then marked out my stab incision site on the lateral border of the pedicle and infiltrated this area with quarter percent Marcaine with epinephrine mixed with Exparel.  Once I achieved adequate local anesthesia I made a small incision and then advanced the Jamshidi needle down to the lateral aspect of the pedicle.  Using biplane fluoroscopy guidance I advanced the Jamshidi needle into the T12 pedicle.  As I advanced that I confirmed satisfactory trajectory in the lateral plane.  Once I was nearing the medial wall of the pedicle I confirmed that I was just beyond the posterior wall of the vertebral body.  I then advanced the Jamshidi into the vertebral body.  I repeated this exact same procedure on the contralateral side.  I then drilled and then sounded the bony canal to ensure it was solid.  I then placed the inflatable bone tamp and inflated them to the appropriate level.  I then deflated  the balloons and then inserted a total of approximately 2 cc on either side for a total of 4 cc at T12.  I had excellent positioning of the cement and fill.  There was no migration or leak of the cement superiorly inferiorly laterally or posteriorly.  At this point I allowed the cement to harden and I remove the Jamshidi needles.  I then repositioned and identified the L1 pedicle on the left-hand side using the same technique I anesthetized the skin and then made a small incision and advanced the Jamshidi needle down.  The left L1 pedicle was noted to be hypoplastic and quite narrow and despite attempting an extrapedicular approach I did not feel as though I could safely position his Jamshidi needle.  As such I placed just the right side Jamshidi needle.  This was advanced taking a transpedicular approach using biplane fluoroscopy guidance.  The bone tamp was then inflated and then the cement was inserted.  Again there was no migration or leakage of the cement superiorly inferiorly laterally or posteriorly.  Again the cement was allowed to harden and I removed the Jamshidi needle.  At this point I have final x-rays were done which were satisfactory.  I had excellent cement fill no adverse positioning of the cement.  All of the back was then cleaned and a simple 3-0 Monocryl stitch was used to close all 4 stab incision sites along with Dermabond and Band-Aid.  The patient was then transferred to the PACU without incident.  The end of the case all needle and sponge counts were  correct there were no adverse intraoperative events.

## 2018-06-30 NOTE — Anesthesia Postprocedure Evaluation (Signed)
Anesthesia Post Note  Patient: Leslie Rosario  Procedure(s) Performed: KYPHOPLASTY L1 and T12 (N/A Back)     Patient location during evaluation: PACU Anesthesia Type: MAC Level of consciousness: awake and alert Pain management: pain level controlled Vital Signs Assessment: post-procedure vital signs reviewed and stable Respiratory status: spontaneous breathing, nonlabored ventilation, respiratory function stable and patient connected to nasal cannula oxygen Cardiovascular status: stable and blood pressure returned to baseline Postop Assessment: no apparent nausea or vomiting Anesthetic complications: no    Last Vitals:  Vitals:   06/30/18 1524 06/30/18 1617  BP: (!) 166/86 139/70  Pulse: 75 68  Resp: 17 17  Temp: 36.8 C   SpO2: 98% 98%    Last Pain:  Vitals:   06/30/18 1524  TempSrc: Oral  PainSc:     LLE Motor Response: Purposeful movement;Responds to commands (06/30/18 1614) LLE Sensation: No numbness;No tingling (06/30/18 1614) RLE Motor Response: Purposeful movement;Responds to commands (06/30/18 1614) RLE Sensation: No numbness;No tingling (06/30/18 1614)      Effie Berkshire

## 2018-06-30 NOTE — Anesthesia Procedure Notes (Signed)
Procedure Name: MAC Date/Time: 06/30/2018 12:23 PM Performed by: Carney Living, CRNA Pre-anesthesia Checklist: Patient identified, Emergency Drugs available, Suction available, Patient being monitored and Timeout performed Patient Re-evaluated:Patient Re-evaluated prior to induction Oxygen Delivery Method: Nasal cannula

## 2018-06-30 NOTE — Discharge Instructions (Signed)
Balloon Kyphoplasty, Care After °Refer to this sheet in the next few weeks. These instructions provide you with information about caring for yourself after your procedure. Your health care provider may also give you more specific instructions. Your treatment has been planned according to current medical practices, but problems sometimes occur. Call your health care provider if you have any problems or questions after your procedure. °What can I expect after the procedure? °After your procedure, it is common to have back pain. °Follow these instructions at home: °Incision care °· Follow instructions from your health care provider about how to take care of your incisions. Make sure you: °? Wash your hands with soap and water before you change your bandage (dressing). If soap and water are not available, use hand sanitizer. °? Change your dressing as told by your health care provider. °? Leave stitches (sutures), skin glue, or adhesive strips in place. These skin closures may need to be in place for 2 weeks or longer. If adhesive strip edges start to loosen and curl up, you may trim the loose edges. Do not remove adhesive strips completely unless your health care provider tells you to do that. °· Check your incision area every day for signs of infection. Watch for: °? Redness, swelling, or pain. °? Fluid, blood, or pus. °· Keep your dressing dry until your health care provider says that it can be removed. °Activity ° °· Rest your back and avoid intense physical activity for as long as told by your health care provider. °· Return to your normal activities as told by your health care provider. Ask your health care provider what activities are safe for you. °· Do not lift anything that is heavier than 10 lb (4.5 kg). This is about the weight of a gallon of milk. You may need to avoid heavy lifting for several weeks. °General instructions °· Take over-the-counter and prescription medicines only as told by your health care  provider. °· If directed, apply ice to the painful area: °? Put ice in a plastic bag. °? Place a towel between your skin and the bag. °? Leave the ice on for 20 minutes, 2-3 times per day. °· Do not use tobacco products, including cigarettes, chewing tobacco, or e-cigarettes. If you need help quitting, ask your health care provider. °· Keep all follow-up visits as told by your health care provider. This is important. °Contact a health care provider if: °· You have a fever. °· You have redness, swelling, or pain at the site of your incisions. °· You have fluid, blood, or pus coming from your incisions. °· You have pain that gets worse or does not get better with medicine. °· You develop numbness or weakness in any part of your body. °Get help right away if: °· You have chest pain. °· You have difficulty breathing. °· You cannot move your legs. °· You cannot control your bladder or bowel movements. °· You suddenly become weak or numb on one side of your body. °· You become very confused. °· You have trouble speaking or understanding, or both. °This information is not intended to replace advice given to you by your health care provider. Make sure you discuss any questions you have with your health care provider. °Document Released: 08/22/2015 Document Revised: 05/08/2016 Document Reviewed: 03/26/2015 °Elsevier Interactive Patient Education © 2018 [employer] ° °

## 2018-07-01 ENCOUNTER — Encounter (HOSPITAL_COMMUNITY): Payer: Self-pay | Admitting: Orthopedic Surgery

## 2018-07-01 DIAGNOSIS — I10 Essential (primary) hypertension: Secondary | ICD-10-CM | POA: Diagnosis not present

## 2018-07-01 DIAGNOSIS — R2681 Unsteadiness on feet: Secondary | ICD-10-CM | POA: Diagnosis not present

## 2018-07-01 DIAGNOSIS — Z79899 Other long term (current) drug therapy: Secondary | ICD-10-CM | POA: Diagnosis not present

## 2018-07-01 DIAGNOSIS — M8088XA Other osteoporosis with current pathological fracture, vertebra(e), initial encounter for fracture: Secondary | ICD-10-CM | POA: Diagnosis not present

## 2018-07-01 DIAGNOSIS — F419 Anxiety disorder, unspecified: Secondary | ICD-10-CM | POA: Diagnosis not present

## 2018-07-01 DIAGNOSIS — Z882 Allergy status to sulfonamides status: Secondary | ICD-10-CM | POA: Diagnosis not present

## 2018-07-01 DIAGNOSIS — Z888 Allergy status to other drugs, medicaments and biological substances status: Secondary | ICD-10-CM | POA: Diagnosis not present

## 2018-07-01 DIAGNOSIS — I4891 Unspecified atrial fibrillation: Secondary | ICD-10-CM | POA: Diagnosis not present

## 2018-07-01 DIAGNOSIS — E785 Hyperlipidemia, unspecified: Secondary | ICD-10-CM | POA: Diagnosis not present

## 2018-07-01 DIAGNOSIS — Z7901 Long term (current) use of anticoagulants: Secondary | ICD-10-CM | POA: Diagnosis not present

## 2018-07-01 DIAGNOSIS — K219 Gastro-esophageal reflux disease without esophagitis: Secondary | ICD-10-CM | POA: Diagnosis not present

## 2018-07-01 DIAGNOSIS — E039 Hypothyroidism, unspecified: Secondary | ICD-10-CM | POA: Diagnosis not present

## 2018-07-01 DIAGNOSIS — F329 Major depressive disorder, single episode, unspecified: Secondary | ICD-10-CM | POA: Diagnosis not present

## 2018-07-01 DIAGNOSIS — Z885 Allergy status to narcotic agent status: Secondary | ICD-10-CM | POA: Diagnosis not present

## 2018-07-01 NOTE — Progress Notes (Signed)
Patient alert and oriented, mae's well, voiding adequate amount of urine, swallowing without difficulty, no c/o dizziness or SOB, no c/o pain at time of discharge. Patient discharged home with family. Script and discharged instructions given to patient. Patient and son stated understanding of instructions given. Patient has an appointment with Dr. Rolena Infante

## 2018-07-01 NOTE — Evaluation (Signed)
Occupational Therapy Evaluation Patient Details Name: Leslie Rosario MRN: [id number] DOB: 11-23-1923 Today's Date: 07/01/2018    History of Present Illness Pt is a 84 y/o female s/p L1 and T12 Kyphoplasty. PMH includes a fib, GERD, T12 compression fx.    Clinical Impression   PTA patient reports independent with ADL, IADL.  She currently requires min assist for UB dressing, supervision for UB bathing, mod A for LB dressing using reacher, mod A for LB dressing, toileting with close supervision, and toilet transfers with min guard to close supervision assistance. She has been educated on precautions, AE, compensatory techniques for ADL participation, recommendations, and DME.  She is unable to recall to or adhere to precautions during functional tasks without verbal cueing, and requires cueing for safety awareness. Recommended son assist with LB dressing, and patient has 24/7 assistance during all mobility and self care tasks (not showering until having assistance from [facility] or aide).  Patient plans to dc home with sons support, but recommend aides support as son was hesitant to assist with ADL tasks.  Recommend 3:1 commode. Will continue to follow while admitted.     Follow Up Recommendations  Home health OT;Supervision/Assistance - 24 hour;Other (comment)(aide)    Equipment Recommendations  3 in 1 bedside commode    Recommendations for Other Services       Precautions / Restrictions Precautions Precautions: Back Precaution Booklet Issued: No(issued in prior PT session ) Precaution Comments: Reviewed back precautions with pt and family, however, required max cueing to ensure adherance precautions.  Restrictions Weight Bearing Restrictions: No      Mobility Bed Mobility Overal bed mobility: Needs Assistance Bed Mobility: Rolling;Sidelying to Sit;Sit to Sidelying Rolling: Min assist Sidelying to sit: Min guard     Sit to sidelying: Min assist General bed mobility comments: seated in  recliner upon therapist entry  Transfers Overall transfer level: Needs assistance Equipment used: Rolling walker (2 wheeled) Transfers: Sit to/from Stand Sit to Stand: Supervision         General transfer comment: cueing for hand placement and technique, able to transition without physical assistance    Balance Overall balance assessment: Needs assistance Sitting-balance support: No upper extremity supported;Feet supported Sitting balance-Leahy Scale: Fair     Standing balance support: During functional activity;No upper extremity supported Standing balance-Leahy Scale: Poor Standing balance comment: able to manage clothing without UE support with close supervision within base of support                           ADL either performed or assessed with clinical judgement   ADL Overall ADL's : Needs assistance/impaired Eating/Feeding: Supervision/ safety;Sitting   Grooming: Supervision/safety;Sitting;Set up   Upper Body Bathing: Set up;Supervision/ safety;Sitting   Lower Body Bathing: Moderate assistance;Sit to/from stand;Cueing for compensatory techniques;Cueing for safety;Cueing for back precautions Lower Body Bathing Details (indicate cue type and reason): educated on safety with precautions, bathing seated, using long sponge and having assistance if in shower Upper Body Dressing : Sitting;Minimal assistance Upper Body Dressing Details (indicate cue type and reason): requires assistance to adhere to precautions and re-position bra Lower Body Dressing: Cueing for compensatory techniques;Cueing for back precautions;Moderate assistance;Sit to/from stand Lower Body Dressing Details (indicate cue type and reason): requires assistance with shoes, max cueing to adhere to back precautions, decreased functional reach and poor understanding/recall using reacher; close supervision when standing Toilet Transfer: Min guard;Ambulation;BSC;RW Toilet Transfer Details (indicate cue  type and reason): cueing for hand  placement and safety; simulated to recliner  Toileting- Clothing Manipulation and Hygiene: Supervision/safety;Sit to/from stand;Cueing for back precautions;Cueing for compensatory techniques Toileting - Clothing Manipulation Details (indicate cue type and reason): educated on precautions and safety techniques   Tub/Shower Transfer Details (indicate cue type and reason): educated on completing with assistance only, did not complete this session Functional mobility during ADLs: Rolling walker;Cueing for safety;Min guard;Supervision/safety General ADL Comments: extensively reviewed precautions and ADL compensation, pt with poor recall      Vision Baseline Vision/History: No visual deficits Patient Visual Report: No change from baseline Vision Assessment?: No apparent visual deficits     Perception     Praxis      Pertinent Vitals/Pain Pain Assessment: Faces Faces Pain Scale: Hurts even more Pain Location: back Pain Descriptors / Indicators: Operative site guarding;Sore Pain Intervention(s): Monitored during session     Hand Dominance Right   Extremity/Trunk Assessment Upper Extremity Assessment Upper Extremity Assessment: Generalized weakness   Lower Extremity Assessment Lower Extremity Assessment: Defer to PT evaluation   Cervical / Trunk Assessment Cervical / Trunk Assessment: Other exceptions Cervical / Trunk Exceptions: s/p kyphoplasty    Communication Communication Communication: No difficulties   Cognition Arousal/Alertness: Awake/alert Behavior During Therapy: WFL for tasks assessed/performed Overall Cognitive Status: Impaired/Different from baseline Area of Impairment: Memory;Problem solving                     Memory: Decreased recall of precautions;Decreased short-term memory       Problem Solving: Slow processing;Decreased initiation;Difficulty sequencing;Requires verbal cues General Comments: increased time and  constant cueing for precautions   General Comments  son presents (exited during dressing), limited support from son for ADLs    Exercises     Shoulder Instructions      Home Living Family/patient expects to be discharged to:: Private residence Living Arrangements: Children Available Help at Discharge: Family;Available PRN/intermittently Type of Home: House Home Access: Stairs to enter [employer] of Steps: 3 Entrance Stairs-Rails: Left Home Layout: One level     Bathroom Shower/Tub: Occupational psychologist: Standard     Home Equipment: Environmental consultant - 2 wheels;Cane - single point   Additional Comments: pt reports son lives with patient, but voices "hes not there all the time"       Prior Functioning/Environment Level of Independence: Independent        Comments: ADLs, IADLs, mobility        OT Problem List: Decreased strength;Decreased activity tolerance;Impaired balance (sitting and/or standing);Decreased safety awareness;Decreased knowledge of use of DME or AE;Decreased knowledge of precautions;Pain;Decreased cognition      OT Treatment/Interventions: Self-care/ADL training;Energy conservation;DME and/or AE instruction;Therapeutic activities;Patient/family education;Balance training    OT Goals(Current goals can be found in the care plan section) Acute Rehab OT Goals Patient Stated Goal: to go home  OT Goal Formulation: With patient Time For Goal Achievement: 07/15/18 Potential to Achieve Goals: Good  OT Frequency: Min 2X/week   Barriers to D/C:            Co-evaluation              AM-PAC PT "6 Clicks" Daily Activity     Outcome Measure Help from another person eating meals?: None Help from another person taking care of personal grooming?: A Little Help from another person toileting, which includes using toliet, bedpan, or urinal?: A Little Help from another person bathing (including washing, rinsing, drying)?: A Little Help from  another person to put on and  taking off regular upper body clothing?: A Little Help from another person to put on and taking off regular lower body clothing?: A Lot 6 Click Score: 18   End of Session Equipment Utilized During Treatment: Rolling walker Nurse Communication: Mobility status  Activity Tolerance: Patient tolerated treatment well Patient left: in chair;with call bell/phone within reach;with family/visitor present  OT Visit Diagnosis: Other abnormalities of gait and mobility (R26.89);Muscle weakness (generalized) (M62.81);Pain Pain - part of body: (back)                Time: 0698-6148 OT Time Calculation (min): 37 min Charges:  OT General Charges $OT Visit: 1 Visit OT Evaluation $OT Eval Moderate Complexity: 1 Mod OT Treatments $Self Care/Home Management : 8-22 mins G-Codes:     Delight Stare, OTR/L  Pager [phone]  Delight Stare 07/01/2018, 10:32 AM

## 2018-07-01 NOTE — Progress Notes (Signed)
Physical Therapy Treatment Patient Details Name: Leslie Rosario MRN: [id number] DOB: [birth year] Today's Date: 07/01/2018    History of Present Illness Pt is a 84 y/o female s/p L1 and T12 Kyphoplasty. PMH includes a fib.     PT Comments    Pt progressing towards physical therapy goals. Was able to attempt stair training this session and completed with min assist for balance support. Son present at end of session. Recommended 24 hour assist initially due to difficulty recalling precautions and continued balance disturbance. Anticipate pt will continue to progress well with post-op mobility upon d/c.    Follow Up Recommendations  Home health PT;Supervision for mobility/OOB     Equipment Recommendations  3in1 (PT)    Recommendations for Other Services OT consult     Precautions / Restrictions Precautions Precautions: Back Precaution Booklet Issued: Yes (comment) Precaution Comments: Reviewed back precautions with pt and family, however, required persistent cues to ensure she followed precautions.  Restrictions Weight Bearing Restrictions: No    Mobility  Bed Mobility Overal bed mobility: Needs Assistance Bed Mobility: Rolling;Sidelying to Sit;Sit to Sidelying Rolling: Min assist Sidelying to sit: Min guard     Sit to sidelying: Min assist General bed mobility comments: Min A for assist with rolling fully onto R hip. Min guard to come up to sitting. Min A for LE assist for return to sidelying. Required cues for log roll technique.   Transfers Overall transfer level: Needs assistance Equipment used: Rolling walker (2 wheeled) Transfers: Sit to/from Stand Sit to Stand: Min assist         General transfer comment: Pt was able to power-up to full stand without assistance. Close guard provided for safety.   Ambulation/Gait Ambulation/Gait assistance: Min guard Gait Distance (Feet): 150 Feet Assistive device: Rolling walker (2 wheeled) Gait Pattern/deviations: Step-through  pattern;Decreased stride length;Shuffle;Trunk flexed Gait velocity: Decreased  Gait velocity interpretation: 1.31 - 2.62 ft/sec, indicative of limited community ambulator General Gait Details: Slow but generally steady with the RW. Pt was able to ambulate to and from stairwell without difficulty, however pt reports fatigue at end of gait training.    Stairs Stairs: Yes Stairs assistance: Min assist Stair Management: One rail Left;Step to pattern;Forwards Number of Stairs: 3 General stair comments: HHA on the R side in addition to L railing use. Assist for balance support and safety to power up to the next step.    Wheelchair Mobility    Modified Rankin (Stroke Patients Only)       Balance Overall balance assessment: Needs assistance Sitting-balance support: No upper extremity supported;Feet supported Sitting balance-Leahy Scale: Fair     Standing balance support: Bilateral upper extremity supported;During functional activity Standing balance-Leahy Scale: Poor Standing balance comment: Reliant on UE support                             Cognition Arousal/Alertness: Awake/alert Behavior During Therapy: WFL for tasks assessed/performed Overall Cognitive Status: Within Functional Limits for tasks assessed                                        Exercises      General Comments        Pertinent Vitals/Pain Pain Assessment: Faces Faces Pain Scale: Hurts even more Pain Location: back  Pain Descriptors / Indicators: Operative site guarding;Sore Pain Intervention(s): Monitored during session  Home Living                      Prior Function            PT Goals (current goals can now be found in the care plan section) Acute Rehab PT Goals Patient Stated Goal: to go home  PT Goal Formulation: With patient Time For Goal Achievement: 07/14/18 Potential to Achieve Goals: Good Progress towards PT goals: Progressing toward goals     Frequency    Min 5X/week      PT Plan Current plan remains appropriate    Co-evaluation              AM-PAC PT "6 Clicks" Daily Activity  Outcome Measure  Difficulty turning over in bed (including adjusting bedclothes, sheets and blankets)?: Unable Difficulty moving from lying on back to sitting on the side of the bed? : Unable Difficulty sitting down on and standing up from a chair with arms (e.g., wheelchair, bedside commode, etc,.)?: A Little Help needed moving to and from a bed to chair (including a wheelchair)?: A Little Help needed walking in hospital room?: A Little Help needed climbing 3-5 steps with a railing? : A Little 6 Click Score: 14    End of Session Equipment Utilized During Treatment: Gait belt Activity Tolerance: Patient tolerated treatment well Patient left: in bed;with call bell/phone within reach;with family/visitor present Nurse Communication: Mobility status PT Visit Diagnosis: Unsteadiness on feet (R26.81);Other abnormalities of gait and mobility (R26.89);Pain Pain - part of body: (back )     Time: 1740-8144 PT Time Calculation (min) (ACUTE ONLY): 32 min  Charges:  $Gait Training: 23-37 mins                    G Codes:       Rolinda Roan, PT, DPT Acute Rehabilitation Services Pager: [phone]    Thelma Comp 07/01/2018, 9:25 AM

## 2018-07-01 NOTE — Progress Notes (Signed)
    Subjective: 1 Day Post-Op Procedure(s) (LRB): KYPHOPLASTY L1 and T12 (N/A) Patient reports pain as 4 on 0-10 scale.   Denies CP or SOB.  Voiding without difficulty. Positive flatus.  Pt passed out yesterday after working with PT  Objective: Vital signs in last 24 hours: Temp:  [97.6 F (36.4 C)-98.3 F (36.8 C)] 97.9 F (36.6 C) (07/18 0723) Pulse Rate:  [67-101] 67 (07/18 0723) Resp:  [10-18] 16 (07/18 0723) BP: (97-189)/(47-95) 135/66 (07/18 0723) SpO2:  [90 %-99 %] 95 % (07/18 0723) Weight:  [60.8 kg (134 lb)] 60.8 kg (134 lb) (07/17 1055)  Intake/Output from previous day: 07/17 0701 - 07/18 0700 In: 700 [I.V.:400; IV Piggyback:300] Out: -  Intake/Output this shift: No intake/output data recorded.  Labs: No results for input(s): HGB in the last 72 hours. No results for input(s): WBC, RBC, HCT, PLT in the last 72 hours. No results for input(s): NA, K, CL, CO2, BUN, CREATININE, GLUCOSE, CALCIUM in the last 72 hours. Recent Labs    06/30/18 1130  INR 1.20    Physical Exam: Neurologically intact ABD soft Sensation intact distally Dorsiflexion/Plantar flexion intact Incision: scant drainage Compartment soft Body mass index is 26.17 kg/m.   Assessment/Plan: 1 Day Post-Op Procedure(s) (LRB): KYPHOPLASTY L1 and T12 (N/A) Advance diet Up with therapy  Hopeful to DC today  Avyon Herendeen, Darla Lesches for Dr. Melina Schools [facility] [phone] 07/01/2018, 7:39 AM

## 2018-07-01 NOTE — Care Management Note (Signed)
Case Management Note  Patient Details  Name: AVAEH EWER MRN: [id number] Date of Birth: November 23, 1923  Subjective/Objective:  Pt is a 84 y/o female s/p L1 and T12 Kyphoplasty.                  Action/Plan: Patient has been active with [facility], no changes. They will resume services at discharge.    Expected Discharge Date:  07/01/18               Expected Discharge Plan:  [place]  In-House Referral:  NA  Discharge planning Services  CM Consult  Post Acute Care Choice:  Home Health, Resumption of Svcs/PTA Provider Choice offered to:  Patient  DME Arranged:  N/A DME Agency:     HH Arranged:  PT, OT, RN, Nurse's Aide [place] Agency:  [facility]  Status of Service:  Completed, signed off  If discussed at [place] of Stay Meetings, dates discussed:    Additional Comments:  Ninfa Meeker, RN 07/01/2018, 1:39 PM

## 2018-07-06 NOTE — Discharge Summary (Signed)
Physician Discharge Summary  Patient ID: Leslie Rosario MRN: [id number] DOB/AGE: November 28, 1923 84 y.o.  Admit date: 06/30/2018 Discharge date: 07/01/18  Admission Diagnoses:  Compression fracture of L1 and T12  Discharge Diagnoses:  Active Problems:   S/P kyphoplasty   Past Medical History:  Diagnosis Date  . Anxiety   . Arthritis   . Atrial fibrillation ([place])   . Constipation   . Depression   . Dyspnea   . Dysrhythmia    A-Fib  . GERD (gastroesophageal reflux disease)   . Hyperlipidemia   . Hypertension   . Hypothyroidism   . Nausea & vomiting   . T12 compression fracture ([place]) 06/2017  . Urinary frequency     Surgeries: Procedure(s): KYPHOPLASTY L1 and T12 on 06/30/2018   Consultants (if any):   Discharged Condition: Improved  Hospital Course: Leslie Rosario is an 84 y.o. female who was admitted 06/30/2018 with a diagnosis of T12 and L1 compression fractures and went to the operating room on 06/30/2018 and underwent the above named procedures.  Post op day one pt reported moderate pain.  The pt had passed out in their bed the night before after PT.  Tramadol was discontinued at that time.  Pts pain was managed on Tylenol. Pt was cleared by PT for DC with home health.  Pt does reside with her son.  Social work also assisted the pt insetting up post op services.   She was given perioperative antibiotics:  Anti-infectives (From admission, onward)   Start     Dose/Rate Route Frequency Ordered Stop   06/30/18 2330  ceFAZolin (ANCEF) IVPB 2g/100 mL premix  Status:  Discontinued     2 g 200 mL/hr over 30 Minutes Intravenous Every 12 hours 06/30/18 1518 07/01/18 1702   06/30/18 1105  ceFAZolin (ANCEF) IVPB 2g/100 mL premix     2 g 200 mL/hr over 30 Minutes Intravenous 30 min pre-op 06/30/18 1105 06/30/18 1245    .  She was given sequential compression devices, early ambulation, and TED for DVT prophylaxis.  She benefited maximally from the hospital stay and there were no  complications.    Recent vital signs:  Vitals:   07/01/18 0339 07/01/18 0723  BP: (!) 104/47 135/66  Pulse: 73 67  Resp: 18 16  Temp: 98.2 F (36.8 C) 97.9 F (36.6 C)  SpO2: 91% 95%    Recent laboratory studies:  Lab Results  Component Value Date   HGB 12.0 06/25/2018   HGB 13.9 09/14/2017   HGB 13.3 09/12/2017   Lab Results  Component Value Date   WBC 7.4 06/25/2018   PLT 528 (H) 06/25/2018   Lab Results  Component Value Date   INR 1.20 06/30/2018   Lab Results  Component Value Date   NA 137 06/25/2018   K 4.0 06/25/2018   CL 105 06/25/2018   CO2 23 06/25/2018   BUN 18 06/25/2018   CREATININE 1.35 (H) 06/25/2018   GLUCOSE 111 (H) 06/25/2018    Discharge Medications:   Allergies as of 07/01/2018      Reactions   Adhesive [tape] Other (See Comments)   REACTION: pulls skin   Codeine    UNKNOWN REACTION   Prednisone    UNKNOWN   Sulfonamide Derivatives    UNKNOWN REACTION   Vicodin [hydrocodone-acetaminophen]    "Intolerance "      Medication List    STOP taking these medications   acetaminophen 500 MG tablet Commonly known as:  TYLENOL  calcitonin (salmon) 200 UNIT/ACT nasal spray Commonly known as:  MIACALCIN/FORTICAL   diclofenac sodium 1 % Gel Commonly known as:  VOLTAREN   lidocaine 5 % Commonly known as:  LIDODERM   simvastatin 20 MG tablet Commonly known as:  ZOCOR     TAKE these medications   ALPRAZolam 0.5 MG tablet Commonly known as:  XANAX Take 0.5 mg by mouth at bedtime.   bisacodyl 10 MG suppository Commonly known as:  DULCOLAX Place 10 mg rectally every three (3) days as needed for moderate constipation.   DULoxetine 20 MG capsule Commonly known as:  CYMBALTA Take 20 mg by mouth daily.   enoxaparin 80 MG/0.8ML injection Commonly known as:  LOVENOX Inject 80 mg into the skin daily.   levothyroxine 75 MCG tablet Commonly known as:  SYNTHROID, LEVOTHROID Take 75 mcg by mouth daily before breakfast.   metoprolol  succinate 25 MG 24 hr tablet Commonly known as:  TOPROL-XL Take 12.5 mg by mouth at bedtime.   omeprazole 20 MG capsule Commonly known as:  PRILOSEC Take 20 mg by mouth every morning.   ondansetron 4 MG disintegrating tablet Commonly known as:  ZOFRAN ODT Take 1 tablet (4 mg total) by mouth every 8 (eight) hours as needed for nausea or vomiting.   traMADol 50 MG tablet Commonly known as:  ULTRAM Take 1 tablet (50 mg total) by mouth 4 (four) times daily.   warfarin 3 MG tablet Commonly known as:  COUMADIN Take 3 mg by mouth daily. Patient states that she is taking 3 mg daily       Diagnostic Studies: Dg Lumbar Spine 2-3 Views  Result Date: 06/30/2018 CLINICAL DATA:  Fluoroscopic localization for kyphoplasty at L1 and T12. EXAM: LUMBAR SPINE - 2-3 VIEW; DG C-ARM 61-120 MIN COMPARISON:  None available. FINDINGS: PA and lateral views of the thoracolumbar spine for localization purposes of kyphoplasty at T12 and L1. Cement appears well positioned within the bony confines of the T12 and L1 vertebral bodies. No adverse features. Focal kyphotic angulation of the thoracolumbar junction noted. No listhesis or malalignment. IMPRESSION: Fluoroscopic localization for kyphoplasty at T12 and L1. No adverse features. Electronically Signed   By: Jeannine Boga M.D.   On: 06/30/2018 15:23   Dg C-arm 1-60 Min  Result Date: 06/30/2018 CLINICAL DATA:  Fluoroscopic localization for kyphoplasty at L1 and T12. EXAM: LUMBAR SPINE - 2-3 VIEW; DG C-ARM 61-120 MIN COMPARISON:  None available. FINDINGS: PA and lateral views of the thoracolumbar spine for localization purposes of kyphoplasty at T12 and L1. Cement appears well positioned within the bony confines of the T12 and L1 vertebral bodies. No adverse features. Focal kyphotic angulation of the thoracolumbar junction noted. No listhesis or malalignment. IMPRESSION: Fluoroscopic localization for kyphoplasty at T12 and L1. No adverse features. Electronically  Signed   By: Jeannine Boga M.D.   On: 06/30/2018 15:23    Disposition:  Pt will present to clinic in 2 weeks Pt discharged into the care of her son Discharge Instructions    Incentive spirometry RT   Complete by:  As directed       Follow-up Information    Melina Schools, MD Follow up in 2 week(s).   Specialty:  Orthopedic Surgery Contact information: [address] [place] [address] [phone]            Signed: Valinda Hoar 07/06/2018, 11:31 AM

## 2018-07-19 DIAGNOSIS — S22030D Wedge compression fracture of third thoracic vertebra, subsequent encounter for fracture with routine healing: Secondary | ICD-10-CM | POA: Diagnosis not present

## 2018-07-19 DIAGNOSIS — Z6823 Body mass index (BMI) 23.0-23.9, adult: Secondary | ICD-10-CM | POA: Diagnosis not present

## 2018-07-19 DIAGNOSIS — R0602 Shortness of breath: Secondary | ICD-10-CM | POA: Diagnosis not present

## 2018-07-19 DIAGNOSIS — Z7901 Long term (current) use of anticoagulants: Secondary | ICD-10-CM | POA: Diagnosis not present

## 2018-07-19 DIAGNOSIS — R2689 Other abnormalities of gait and mobility: Secondary | ICD-10-CM | POA: Diagnosis not present

## 2018-07-19 DIAGNOSIS — I482 Chronic atrial fibrillation: Secondary | ICD-10-CM | POA: Diagnosis not present

## 2018-07-28 DIAGNOSIS — Z7901 Long term (current) use of anticoagulants: Secondary | ICD-10-CM | POA: Diagnosis not present

## 2018-07-28 DIAGNOSIS — I482 Chronic atrial fibrillation: Secondary | ICD-10-CM | POA: Diagnosis not present

## 2018-08-09 DIAGNOSIS — M4856XD Collapsed vertebra, not elsewhere classified, lumbar region, subsequent encounter for fracture with routine healing: Secondary | ICD-10-CM | POA: Diagnosis not present

## 2018-08-23 NOTE — Progress Notes (Signed)
Cardiology Office Note   Date:  08/24/2018   ID:  Leslie Rosario, DOB 04/17/1923, MRN [id number]  PCP:  Celene Squibb, MD  Cardiologist:  Dr. Claiborne Billings  Chief Complaint  Patient presents with  . Edema    sore legs  . Atrial Fibrillation     History of Present Illness: Leslie Rosario is a 84 y.o. female who presents for ongoing assessment and management of atrial fib on coumadin for anticoagulation, hypertension, and hyperlipidemia. Other history includes hypothyroidism s/p thyroidectomy.  She has chronic LEE. She has a compression fx of her thoracic spine. She was seen last by Dr. Claiborne Billings on 06/08/2018 for pre-operative evaluation to have kyphoplasty surgery and was to hold coumadin.    She was discharge from the hospital on 07/01/2018 after surgery for T-12 and L1 compression fx. During hospitalization she had syncopal episode and tramadol was discontinued.   She is followed by Dr. Nevada Crane in [place] for coumadin dosing. She has fallen 2 weeks ago and injured her right chest. She continues to have soreness there.  She states she regrets having back surgery as she has had more pain since having the procedure.   Past Medical History:  Diagnosis Date  . Anxiety   . Arthritis   . Atrial fibrillation ([place])   . Constipation   . Depression   . Dyspnea   . Dysrhythmia    A-Fib  . GERD (gastroesophageal reflux disease)   . Hyperlipidemia   . Hypertension   . Hypothyroidism   . Nausea & vomiting   . T12 compression fracture ([place]) 06/2017  . Urinary frequency     Past Surgical History:  Procedure Laterality Date  . COLONOSCOPY    . KYPHOPLASTY N/A 06/30/2018   Procedure: KYPHOPLASTY L1 and T12;  Surgeon: Melina Schools, MD;  Location: [facility];  Service: Orthopedics;  Laterality: N/A;  90 mins  . NM MYOCAR PERF WALL MOTION  04/2006   dipyridamole; normal pattern of perfusion to all regions, post-stress EF 73%  . TONSILLECTOMY    . TRANSTHORACIC ECHOCARDIOGRAM  04/2012   EF=>55%, borderline conc  LVH; LA mod dilated; RA mildly dilated; mild mitral annular calcif, mod MR; mild-mod TR with elevated RSVP 30-85mmHg, mild pulm HTN; mild calcif of AV leaflets, AV mildly sclerotic; aortic root sclerosis/calcif  . UPPER GASTROINTESTINAL ENDOSCOPY       Current Outpatient Medications  Medication Sig Dispense Refill  . ALPRAZolam (XANAX) 0.5 MG tablet Take 0.5 mg by mouth at bedtime.     . bisacodyl (DULCOLAX) 10 MG suppository Place 10 mg rectally every three (3) days as needed for moderate constipation.    . DULoxetine (CYMBALTA) 20 MG capsule Take 20 mg by mouth daily.    Marland Kitchen levothyroxine (SYNTHROID, LEVOTHROID) 75 MCG tablet Take 75 mcg by mouth daily before breakfast.    . metoprolol (TOPROL-XL) 25 MG 24 hr tablet Take 12.5 mg by mouth at bedtime.     Marland Kitchen omeprazole (PRILOSEC) 20 MG capsule Take 20 mg by mouth every morning.     . simvastatin (ZOCOR) 20 MG tablet Take 20 mg by mouth daily.    Marland Kitchen warfarin (COUMADIN) 3 MG tablet Take 3 mg by mouth daily. Patient states that she is taking 3 mg daily     No current facility-administered medications for this visit.     Allergies:   Adhesive [tape]; Codeine; Prednisone; Sulfonamide derivatives; and Vicodin [hydrocodone-acetaminophen]    Social History:  The patient  reports that she quit  smoking about 33 years ago. She has never used smokeless tobacco. She reports that she does not drink alcohol or use drugs.   Family History:  The patient's family history includes Aneurysm in her brother; Aortic aneurysm in her unknown relative; Cancer in her brother, father, and mother; Emphysema in her brother and brother; Healthy in her son; Kidney cancer in her son; Ovarian cancer in her mother; Pneumonia in her brother; Sleep apnea in her son.    ROS: All other systems are reviewed and negative. Unless otherwise mentioned in H&P    PHYSICAL EXAM: VS:  BP (!) 163/79 (BP Location: Right Arm, Patient Position: Sitting, Cuff Size: Normal)   Pulse 63   Ht  5\' 1"  (1.549 m)   Wt 130 lb (59 kg)   BMI 24.56 kg/m  , BMI Body mass index is 24.56 kg/m. GEN: Well nourished, well developed, in no acute distress, frail.  HEENT: normal  Neck: no JVD, carotid bruits, or masses Cardiac: IRRR; no murmurs, rubs, or gallops, left leg dependent edema. Wearing support hose.   Respiratory:  clear to auscultation bilaterally, normal work of breathing GI: soft, nontender, nondistended, + BS MS: no deformity or atrophy Soreness on the right.  Skin: warm and dry, no rash Neuro:  Strength and sensation are intact Psych: euthymic mood, full affect   EKG: Atrial fib rate of 61 bpm.   Recent Labs: 09/12/2017: ALT 12; B Natriuretic Peptide 238.0; TSH 1.054 06/25/2018: BUN 18; Creatinine, Ser 1.35; Hemoglobin 12.0; Platelets 528; Potassium 4.0; Sodium 137    Lipid Panel    Component Value Date/Time   CHOL 167 2017/10/04 0533   TRIG 140 10-04-2017 0533   HDL 42 Oct 04, 2017 0533   CHOLHDL 4.0 10/04/17 0533   VLDL 28 Oct 04, 2017 0533   LDLCALC 97 10/04/17 0533      Wt Readings from Last 3 Encounters:  08/24/18 130 lb (59 kg)  06/30/18 134 lb (60.8 kg)  06/25/18 133 lb 14.4 oz (60.7 kg)      Other studies Reviewed: Echocardiogram 10/04/18  Left ventricle: The cavity size was normal. Wall thickness was   normal. Systolic function was normal. The estimated ejection   fraction was in the range of 60% to 65%. Wall motion was normal;   there were no regional wall motion abnormalities. - Aortic valve: Mildly calcified annulus. Mildly thickened   leaflets. Valve area (VTI): 1.52 cm^2. Valve area (Vmax): 1.5   cm^2. Valve area (Vmean): 1.25 cm^2. - Mitral valve: There was mild regurgitation. - Left atrium: The atrium was moderately dilated. - Right ventricle: The cavity size was mildly to moderately   dilated. - Right atrium: The atrium was moderately dilated. - Atrial septum: There was a patent foramen ovale. There is a small   left to right  shunt. - Pulmonary arteries: Systolic pressure was moderately increased.   PA peak pressure: 56 mm Hg (S).  Carotid Doppler Ultrasound 10/04/17 Very minimal amount of bilateral intimal thickening and atherosclerotic plaque, not resulting in a hemodynamically significant stenosis within either internal carotid artery.   ASSESSMENT AND PLAN:  1.  Atrial fib: Rate is controlled despite stating she is having pain. She continues on metoprolol 12.5 mg at HS. She continues on coumadin and is followed by Dr Nevada Crane for dosing. I will check CBC.   2. Hypercholesterolemia: She remains on statin therapy.   3. Right sided chest pain from fall. I will check CXR (PA/LAT) at [facility] as this is closer for her, to  make sure she does not have a fx. She is tender on the right lower chest.    Current medicines are reviewed at length with the patient today.    Labs/ tests ordered today include: BMET, CBC, INR CXR   Phill Myron. West Pugh, ANP, AACC   08/24/2018 5:04 PM    [facility] [address], [place], [place] [address] Phone: [phone]; Fax: [phone]

## 2018-08-24 ENCOUNTER — Encounter: Payer: Self-pay | Admitting: Adult Health

## 2018-08-24 ENCOUNTER — Ambulatory Visit: Payer: Medicare Other | Admitting: Adult Health

## 2018-08-24 VITALS — BP 163/79 | HR 63 | Ht 61.0 in | Wt 130.0 lb

## 2018-08-24 DIAGNOSIS — R0789 Other chest pain: Secondary | ICD-10-CM

## 2018-08-24 DIAGNOSIS — Z79899 Other long term (current) drug therapy: Secondary | ICD-10-CM | POA: Diagnosis not present

## 2018-08-24 DIAGNOSIS — I482 Chronic atrial fibrillation: Secondary | ICD-10-CM

## 2018-08-24 DIAGNOSIS — W19XXXS Unspecified fall, sequela: Secondary | ICD-10-CM | POA: Diagnosis not present

## 2018-08-24 DIAGNOSIS — I4821 Permanent atrial fibrillation: Secondary | ICD-10-CM

## 2018-08-24 NOTE — Patient Instructions (Signed)
Curt Bears NP recommends that you return for lab work TODAY  A chest x-ray takes a picture of the organs and structures inside the chest, including the heart, lungs, and blood vessels. This test can show several things, including, whether the heart is enlarges; whether fluid is building up in the lungs; and whether pacemaker / defibrillator leads are still in place. Please have this done at [facility] NP wants you to follow-up in: 6 months with Dr. Claiborne Billings. You will receive a reminder letter in the mail two months in advance. If you don't receive a letter, please call our office to schedule the follow-up appointment.

## 2018-08-25 LAB — CBC
Hematocrit: 41.7 % (ref 34.0–46.6)
Hemoglobin: 13.2 g/dL (ref 11.1–15.9)
MCH: 29.2 pg (ref 26.6–33.0)
MCHC: 31.7 g/dL (ref 31.5–35.7)
MCV: 92 fL (ref 79–97)
Platelets: 588 10*3/uL — ABNORMAL HIGH (ref 150–450)
RBC: 4.52 x10E6/uL (ref 3.77–5.28)
RDW: 13.1 % (ref 12.3–15.4)
WBC: 5.5 10*3/uL (ref 3.4–10.8)

## 2018-08-25 LAB — BASIC METABOLIC PANEL
BUN/Creatinine Ratio: 16 (ref 12–28)
BUN: 19 mg/dL (ref 10–36)
CO2: 23 mmol/L (ref 20–29)
Calcium: 9.6 mg/dL (ref 8.7–10.3)
Chloride: 102 mmol/L (ref 96–106)
Creatinine, Ser: 1.21 mg/dL — ABNORMAL HIGH (ref 0.57–1.00)
GFR calc Af Amer: 45 mL/min/{1.73_m2} — ABNORMAL LOW (ref >59)
GFR calc non Af Amer: 39 mL/min/{1.73_m2} — ABNORMAL LOW (ref >59)
Glucose: 90 mg/dL (ref 65–99)
Potassium: 4.7 mmol/L (ref 3.5–5.2)
Sodium: 140 mmol/L (ref 134–144)

## 2018-08-25 LAB — PROTIME-INR
INR: 2.3 — ABNORMAL HIGH (ref 0.8–1.2)
Prothrombin Time: 22.8 s — ABNORMAL HIGH (ref 9.1–12.0)

## 2018-09-17 DIAGNOSIS — I4891 Unspecified atrial fibrillation: Secondary | ICD-10-CM | POA: Diagnosis not present

## 2018-09-17 DIAGNOSIS — R600 Localized edema: Secondary | ICD-10-CM | POA: Diagnosis not present

## 2018-09-17 DIAGNOSIS — R2689 Other abnormalities of gait and mobility: Secondary | ICD-10-CM | POA: Diagnosis not present

## 2018-09-17 DIAGNOSIS — N183 Chronic kidney disease, stage 3 (moderate): Secondary | ICD-10-CM | POA: Diagnosis not present

## 2018-11-12 ENCOUNTER — Inpatient Hospital Stay (HOSPITAL_COMMUNITY)
Admission: EM | Admit: 2018-11-12 | Discharge: 2018-11-14 | DRG: 194 | Disposition: A | Discharge status: Home Health | Payer: Medicare Other | Attending: Internal Medicine | Admitting: Internal Medicine

## 2018-11-12 ENCOUNTER — Emergency Department (HOSPITAL_COMMUNITY): Payer: Medicare Other

## 2018-11-12 ENCOUNTER — Encounter (HOSPITAL_COMMUNITY): Payer: Self-pay | Admitting: Emergency Medicine

## 2018-11-12 ENCOUNTER — Other Ambulatory Visit: Payer: Self-pay

## 2018-11-12 DIAGNOSIS — R05 Cough: Secondary | ICD-10-CM | POA: Diagnosis not present

## 2018-11-12 DIAGNOSIS — Z885 Allergy status to narcotic agent status: Secondary | ICD-10-CM

## 2018-11-12 DIAGNOSIS — J181 Lobar pneumonia, unspecified organism: Secondary | ICD-10-CM | POA: Diagnosis not present

## 2018-11-12 DIAGNOSIS — E860 Dehydration: Secondary | ICD-10-CM | POA: Diagnosis present

## 2018-11-12 DIAGNOSIS — K219 Gastro-esophageal reflux disease without esophagitis: Secondary | ICD-10-CM | POA: Diagnosis not present

## 2018-11-12 DIAGNOSIS — Z882 Allergy status to sulfonamides status: Secondary | ICD-10-CM

## 2018-11-12 DIAGNOSIS — F329 Major depressive disorder, single episode, unspecified: Secondary | ICD-10-CM | POA: Diagnosis present

## 2018-11-12 DIAGNOSIS — I4821 Permanent atrial fibrillation: Secondary | ICD-10-CM | POA: Diagnosis not present

## 2018-11-12 DIAGNOSIS — Z79899 Other long term (current) drug therapy: Secondary | ICD-10-CM | POA: Diagnosis not present

## 2018-11-12 DIAGNOSIS — Z888 Allergy status to other drugs, medicaments and biological substances status: Secondary | ICD-10-CM | POA: Diagnosis not present

## 2018-11-12 DIAGNOSIS — I4891 Unspecified atrial fibrillation: Secondary | ICD-10-CM | POA: Diagnosis present

## 2018-11-12 DIAGNOSIS — J168 Pneumonia due to other specified infectious organisms: Secondary | ICD-10-CM | POA: Diagnosis not present

## 2018-11-12 DIAGNOSIS — G9349 Other encephalopathy: Secondary | ICD-10-CM | POA: Diagnosis present

## 2018-11-12 DIAGNOSIS — E039 Hypothyroidism, unspecified: Secondary | ICD-10-CM | POA: Diagnosis not present

## 2018-11-12 DIAGNOSIS — Z87891 Personal history of nicotine dependence: Secondary | ICD-10-CM

## 2018-11-12 DIAGNOSIS — F0390 Unspecified dementia without behavioral disturbance: Secondary | ICD-10-CM | POA: Diagnosis present

## 2018-11-12 DIAGNOSIS — N183 Chronic kidney disease, stage 3 (moderate): Secondary | ICD-10-CM | POA: Diagnosis present

## 2018-11-12 DIAGNOSIS — J189 Pneumonia, unspecified organism: Principal | ICD-10-CM | POA: Diagnosis present

## 2018-11-12 DIAGNOSIS — I129 Hypertensive chronic kidney disease with stage 1 through stage 4 chronic kidney disease, or unspecified chronic kidney disease: Secondary | ICD-10-CM | POA: Diagnosis present

## 2018-11-12 DIAGNOSIS — F419 Anxiety disorder, unspecified: Secondary | ICD-10-CM | POA: Diagnosis present

## 2018-11-12 DIAGNOSIS — E871 Hypo-osmolality and hyponatremia: Secondary | ICD-10-CM | POA: Diagnosis present

## 2018-11-12 DIAGNOSIS — I10 Essential (primary) hypertension: Secondary | ICD-10-CM | POA: Diagnosis not present

## 2018-11-12 DIAGNOSIS — I482 Chronic atrial fibrillation, unspecified: Secondary | ICD-10-CM | POA: Diagnosis not present

## 2018-11-12 DIAGNOSIS — Z7901 Long term (current) use of anticoagulants: Secondary | ICD-10-CM | POA: Diagnosis not present

## 2018-11-12 DIAGNOSIS — E7800 Pure hypercholesterolemia, unspecified: Secondary | ICD-10-CM | POA: Diagnosis not present

## 2018-11-12 DIAGNOSIS — S22080A Wedge compression fracture of T11-T12 vertebra, initial encounter for closed fracture: Secondary | ICD-10-CM | POA: Diagnosis present

## 2018-11-12 DIAGNOSIS — R531 Weakness: Secondary | ICD-10-CM | POA: Diagnosis not present

## 2018-11-12 DIAGNOSIS — R059 Cough, unspecified: Secondary | ICD-10-CM

## 2018-11-12 LAB — URINALYSIS, ROUTINE W REFLEX MICROSCOPIC
Bilirubin Urine: NEGATIVE
Glucose, UA: NEGATIVE mg/dL
Hgb urine dipstick: NEGATIVE
Ketones, ur: NEGATIVE mg/dL
Leukocytes, UA: NEGATIVE
Nitrite: NEGATIVE
Protein, ur: 30 mg/dL — AB
Specific Gravity, Urine: 1.013 (ref 1.005–1.030)
pH: 5 (ref 5.0–8.0)

## 2018-11-12 LAB — BASIC METABOLIC PANEL
Anion gap: 9 (ref 5–15)
BUN: 29 mg/dL — ABNORMAL HIGH (ref 8–23)
CO2: 23 mmol/L (ref 22–32)
Calcium: 8.7 mg/dL — ABNORMAL LOW (ref 8.9–10.3)
Chloride: 101 mmol/L (ref 98–111)
Creatinine, Ser: 1.36 mg/dL — ABNORMAL HIGH (ref 0.44–1.00)
GFR calc Af Amer: 39 mL/min — ABNORMAL LOW (ref >60)
GFR calc non Af Amer: 33 mL/min — ABNORMAL LOW (ref >60)
Glucose, Bld: 128 mg/dL — ABNORMAL HIGH (ref 70–99)
Potassium: 3.7 mmol/L (ref 3.5–5.1)
Sodium: 133 mmol/L — ABNORMAL LOW (ref 135–145)

## 2018-11-12 LAB — CBC
HCT: 37 % (ref 36.0–46.0)
Hemoglobin: 11.9 g/dL — ABNORMAL LOW (ref 12.0–15.0)
MCH: 28.7 pg (ref 26.0–34.0)
MCHC: 32.2 g/dL (ref 30.0–36.0)
MCV: 89.2 fL (ref 80.0–100.0)
Platelets: 486 10*3/uL — ABNORMAL HIGH (ref 150–400)
RBC: 4.15 MIL/uL (ref 3.87–5.11)
RDW: 17.2 % — ABNORMAL HIGH (ref 11.5–15.5)
WBC: 12.1 10*3/uL — ABNORMAL HIGH (ref 4.0–10.5)
nRBC: 0 % (ref 0.0–0.2)

## 2018-11-12 LAB — BLOOD GAS, ARTERIAL
Acid-base deficit: 1.6 mmol/L (ref 0.0–2.0)
Bicarbonate: 23.3 mmol/L (ref 20.0–28.0)
Drawn by: 234301
FIO2: 21
O2 Saturation: 90.5 %
Patient temperature: 37
pCO2 arterial: 34.8 mmHg (ref 32.0–48.0)
pH, Arterial: 7.422 (ref 7.350–7.450)
pO2, Arterial: 60.3 mmHg — ABNORMAL LOW (ref 83.0–108.0)

## 2018-11-12 LAB — TROPONIN I: Troponin I: <0.03 ng/mL (ref <0.03)

## 2018-11-12 LAB — PROTIME-INR
INR: 1.95
Prothrombin Time: 22 seconds — ABNORMAL HIGH (ref 11.4–15.2)

## 2018-11-12 MED ORDER — ONDANSETRON HCL 4 MG/2ML IJ SOLN: 4.0000 mg | Freq: Four times a day (QID) | INTRAMUSCULAR | Status: DC | PRN

## 2018-11-12 MED ORDER — WARFARIN - PHARMACIST DOSING INPATIENT: Freq: Every day | Status: DC

## 2018-11-12 MED ORDER — SODIUM CHLORIDE 0.9 % IV SOLN
1.0000 g | INTRAVENOUS | Status: DC
  Administered 2018-11-13: 1 g via INTRAVENOUS
  Filled 2018-11-12: qty 1
  Filled 2018-11-12: qty 10

## 2018-11-12 MED ORDER — BENZONATATE 100 MG PO CAPS: 100.0000 mg | ORAL_CAPSULE | Freq: Three times a day (TID) | ORAL | Status: DC | PRN

## 2018-11-12 MED ORDER — SODIUM CHLORIDE 0.9 % IV SOLN
500.0000 mg | INTRAVENOUS | Status: DC
  Administered 2018-11-12 – 2018-11-13 (×2): 500 mg via INTRAVENOUS
  Filled 2018-11-12 (×4): qty 500

## 2018-11-12 MED ORDER — IPRATROPIUM-ALBUTEROL 0.5-2.5 (3) MG/3ML IN SOLN: 3.0000 mL | RESPIRATORY_TRACT | Status: DC | PRN

## 2018-11-12 MED ORDER — IBUPROFEN 100 MG PO CHEW
100.0000 mg | CHEWABLE_TABLET | Freq: Three times a day (TID) | ORAL | Status: DC | PRN
  Filled 2018-11-12: qty 1

## 2018-11-12 MED ORDER — PANTOPRAZOLE SODIUM 40 MG PO TBEC
40.0000 mg | DELAYED_RELEASE_TABLET | Freq: Every day | ORAL | Status: DC
  Administered 2018-11-13 – 2018-11-14 (×2): 40 mg via ORAL
  Filled 2018-11-12 (×2): qty 1

## 2018-11-12 MED ORDER — WARFARIN SODIUM 2 MG PO TABS: 3.0000 mg | ORAL_TABLET | Freq: Every day | ORAL | Status: DC

## 2018-11-12 MED ORDER — SIMVASTATIN 20 MG PO TABS
20.0000 mg | ORAL_TABLET | Freq: Every day | ORAL | Status: DC
  Administered 2018-11-13: 20 mg via ORAL
  Filled 2018-11-12: qty 1

## 2018-11-12 MED ORDER — METOPROLOL SUCCINATE ER 25 MG PO TB24
12.5000 mg | ORAL_TABLET | Freq: Every day | ORAL | Status: DC
  Administered 2018-11-12 – 2018-11-13 (×2): 12.5 mg via ORAL
  Filled 2018-11-12 (×2): qty 1

## 2018-11-12 MED ORDER — WARFARIN SODIUM 2 MG PO TABS
3.0000 mg | ORAL_TABLET | Freq: Once | ORAL | Status: AC
  Administered 2018-11-12: 3 mg via ORAL
  Filled 2018-11-12: qty 1

## 2018-11-12 MED ORDER — LEVOTHYROXINE SODIUM 75 MCG PO TABS
75.0000 ug | ORAL_TABLET | Freq: Every day | ORAL | Status: DC
  Administered 2018-11-13 – 2018-11-14 (×2): 75 ug via ORAL
  Filled 2018-11-12 (×2): qty 1

## 2018-11-12 MED ORDER — SODIUM CHLORIDE 0.9 % IV SOLN
1.0000 g | Freq: Once | INTRAVENOUS | Status: AC
  Administered 2018-11-12: 1 g via INTRAVENOUS
  Filled 2018-11-12: qty 10

## 2018-11-12 MED ORDER — SODIUM CHLORIDE 0.9 % IV BOLUS
1000.0000 mL | Freq: Once | INTRAVENOUS | Status: AC
  Administered 2018-11-12: 1000 mL via INTRAVENOUS

## 2018-11-12 NOTE — ED Provider Notes (Signed)
[facility] EMERGENCY DEPARTMENT Provider Note   CSN: [id number] Arrival date & time: 11/12/18  1029     History   Chief Complaint Chief Complaint  Patient presents with  . Weakness    HPI Leslie Rosario is a 84 y.o. female.  Level 5 caveat for inability to give history.  History obtained from 2 sons.  Patient is normally ambulatory with a walker.  She has been very weak the past several days and unable to get out of bed.  Decreased oral intake.  Review of systems positive for sinus congestion.  Unable to get full history from patient.     Past Medical History:  Diagnosis Date  . Anxiety   . Arthritis   . Atrial fibrillation ([place])   . Constipation   . Depression   . Dyspnea   . Dysrhythmia    A-Fib  . GERD (gastroesophageal reflux disease)   . Hyperlipidemia   . Hypertension   . Hypothyroidism   . Nausea & vomiting   . T12 compression fracture ([place]) 06/2017  . Urinary frequency     Patient Active Problem List   Diagnosis Date Noted  . S/P kyphoplasty 06/30/2018  . Acute encephalopathy 09/13/2017  . T12 compression fracture ([place]) 09/13/2017  . Altered mental status   . Chronic prescription benzodiazepine use   . Opiate use   . Slurred speech 09/12/2017  . Essential hypertension 07/23/2016  . Hypothyroidism 03/30/2015  . Mild pulmonary hypertension ([place]) 01/16/2014  . Moderate mitral regurgitation by prior echocardiogram 01/16/2014  . Long term current use of anticoagulant therapy 02/20/2013  . Dehydration 07/18/2012  . UTI (urinary tract infection) 07/18/2012  . Constipation 02/17/2012  . Acute gastritis 12/29/2011  . GERD (gastroesophageal reflux disease)   . Atrial fibrillation ([place]) 09/12/2010  . POLYDIPSIA 09/12/2010  . DYSPHAGIA 08/22/2010  . ABDOMINAL PAIN, LEFT LOWER QUADRANT 03/05/2010  . HYPERCHOLESTEROLEMIA 03/04/2010  . HYPERLIPIDEMIA 03/04/2010  . Anxiety state 03/04/2010  . GERD 03/04/2010  . HIATAL HERNIA, HX OF 03/04/2010    Past  Surgical History:  Procedure Laterality Date  . COLONOSCOPY    . KYPHOPLASTY N/A 06/30/2018   Procedure: KYPHOPLASTY L1 and T12;  Surgeon: Melina Schools, MD;  Location: [facility];  Service: Orthopedics;  Laterality: N/A;  90 mins  . NM MYOCAR PERF WALL MOTION  04/2006   dipyridamole; normal pattern of perfusion to all regions, post-stress EF 73%  . TONSILLECTOMY    . TRANSTHORACIC ECHOCARDIOGRAM  04/2012   EF=>55%, borderline conc LVH; LA mod dilated; RA mildly dilated; mild mitral annular calcif, mod MR; mild-mod TR with elevated RSVP 30-60mmHg, mild pulm HTN; mild calcif of AV leaflets, AV mildly sclerotic; aortic root sclerosis/calcif  . UPPER GASTROINTESTINAL ENDOSCOPY       OB History   None      Home Medications    Prior to Admission medications   Medication Sig Start Date End Date Taking? Authorizing Provider  ALPRAZolam Duanne Moron) 0.5 MG tablet Take 0.5 mg by mouth at bedtime.    Yes [provider]  bisacodyl (DULCOLAX) 10 MG suppository Place 10 mg rectally every three (3) days as needed for moderate constipation.   Yes [provider]  DULoxetine (CYMBALTA) 20 MG capsule Take 20 mg by mouth daily.   Yes [provider]  levothyroxine (SYNTHROID, LEVOTHROID) 75 MCG tablet Take 75 mcg by mouth daily before breakfast.   Yes [provider]  metoprolol (TOPROL-XL) 25 MG 24 hr tablet Take 12.5 mg  by mouth at bedtime.    Yes [provider]  omeprazole (PRILOSEC) 20 MG capsule Take 20 mg by mouth every morning.    Yes [provider]  simvastatin (ZOCOR) 20 MG tablet Take 20 mg by mouth daily.   Yes [provider]  warfarin (COUMADIN) 3 MG tablet Take 3 mg by mouth daily. Patient states that she is taking 3 mg daily   Yes [provider]    Family History Family History  Problem Relation Age of Onset  . Cancer Mother   . Ovarian cancer Mother   . Cancer Father   . Cancer Brother   . Emphysema Brother   .  Emphysema Brother   . Pneumonia Brother   . Aneurysm Brother   . Healthy Son   . Sleep apnea Son   . Aortic aneurysm Unknown   . Kidney cancer Son     Social History Social History   Tobacco Use  . Smoking status: Former Smoker    Last attempt to quit: 01/10/1985    Years since quitting: 33.8  . Smokeless tobacco: Never Used  . Tobacco comment: never inhaled  Substance Use Topics  . Alcohol use: No    Alcohol/week: 0.0 standard drinks  . Drug use: No     Allergies   Adhesive [tape]; Codeine; Prednisone; Sulfonamide derivatives; and Vicodin [hydrocodone-acetaminophen]   Review of Systems Review of Systems  Unable to perform ROS: Acuity of condition     Physical Exam Updated Vital Signs BP (!) 150/77   Pulse 70   Temp (!) 97.5 F (36.4 C) (Oral)   Resp (!) 25   Ht 5\' 1"  (1.549 m)   Wt 56.2 kg   SpO2 96%   BMI 23.43 kg/m   Physical Exam  Constitutional:  Pale, mumbles answers to questions.  HENT:  Head: Normocephalic and atraumatic.  Eyes: Conjunctivae are normal.  Neck: Neck supple.  Cardiovascular: Normal rate and regular rhythm.  Pulmonary/Chest: Effort normal and breath sounds normal.  No frank respiratory distress  Abdominal: Soft. Bowel sounds are normal.  Musculoskeletal:  unable  Neurological:  unable  Skin: Skin is warm and dry.  Psychiatric:  unable  Nursing note and vitals reviewed.    ED Treatments / Results  Labs (all labs ordered are listed, but only abnormal results are displayed) Labs Reviewed  BASIC METABOLIC PANEL - Abnormal; Notable for the following components:      Result Value   Sodium 133 (*)    Glucose, Bld 128 (*)    BUN 29 (*)    Creatinine, Ser 1.36 (*)    Calcium 8.7 (*)    GFR calc non Af Amer 33 (*)    GFR calc Af Amer 39 (*)    All other components within normal limits  CBC - Abnormal; Notable for the following components:   WBC 12.1 (*)    Hemoglobin 11.9 (*)    RDW 17.2 (*)    Platelets 486 (*)     All other components within normal limits  URINALYSIS, ROUTINE W REFLEX MICROSCOPIC - Abnormal; Notable for the following components:   APPearance HAZY (*)    Protein, ur 30 (*)    Bacteria, UA RARE (*)    All other components within normal limits  TROPONIN I    EKG EKG Interpretation  Date/Time:  Friday November 12 2018 10:45:22 EST Ventricular Rate:  73 PR Interval:    QRS Duration: 99 QT Interval:  407 QTC Calculation:  449 R Axis:   55 Text Interpretation:  Atrial fibrillation Low voltage, precordial leads Borderline repolarization abnormality Confirmed by [name] [phone]) on 11/12/2018 1:00:15 PM   Radiology Dg Chest Port 1 View  Result Date: 11/12/2018 CLINICAL DATA:  Cough.  Weakness.  Atrial fibrillation. EXAM: PORTABLE CHEST 1 VIEW COMPARISON:  09/12/2017 and 02/25/2013 FINDINGS: There is a new focal area of infiltrate in the lateral aspect of the right upper lobe adjacent to the minor fissure. Heart size and pulmonary vascularity are normal. No appreciable effusions. No acute bone abnormality. Aortic atherosclerosis. IMPRESSION: Faint infiltrate in the right upper lobe which may represent pneumonia. Aortic Atherosclerosis (ICD10-I70.0). Electronically Signed   By: Lorriane Shire M.D.   On: 11/12/2018 11:29    Procedures Procedures (including critical care time)  Medications Ordered in ED Medications  azithromycin (ZITHROMAX) 500 mg in sodium chloride 0.9 % 250 mL IVPB (has no administration in time range)  cefTRIAXone (ROCEPHIN) 1 g in sodium chloride 0.9 % 100 mL IVPB (has no administration in time range)  sodium chloride 0.9 % bolus 1,000 mL (0 mLs Intravenous Stopped 11/12/18 1424)     Initial Impression / Assessment and Plan / ED Course  I have reviewed the triage vital signs and the nursing notes.  Pertinent labs & imaging results that were available during my care of the patient were reviewed by me and considered in my medical decision making (see chart  for details).    Patient complains of profound weakness.  Chest x-ray suggests right upper lobe pneumonia.  IV Rocephin, IV Zithromax, admit to general medicine.   Final Clinical Impressions(s) / ED Diagnoses   Final diagnoses:  Pneumonia of right upper lobe due to infectious organism [facility])    ED Discharge Orders    None       [name], MD 11/12/18 [phone]

## 2018-11-12 NOTE — Progress Notes (Signed)
ANTICOAGULATION CONSULT NOTE - Initial Consult  Pharmacy Consult for warfarin Indication: atrial fibrillation  Allergies  Allergen Reactions  . Adhesive [Tape] Other (See Comments)    REACTION: pulls skin  . Codeine     UNKNOWN REACTION  . Prednisone     UNKNOWN  . Sulfonamide Derivatives     UNKNOWN REACTION  . Vicodin [Hydrocodone-Acetaminophen]     "Intolerance "    Patient Measurements: Height: 5\' 3"  (160 cm) Weight: 123 lb 7.3 oz (56 kg) IBW/kg (Calculated) : 52.4   Vital Signs: Temp: 97.9 F (36.6 C) (11/29 1726) Temp Source: Oral (11/29 1726) BP: 139/88 (11/29 1726) Pulse Rate: 74 (11/29 1726)  Labs: Recent Labs    11/12/18 1109 11/12/18 1728  HGB 11.9*  --   HCT 37.0  --   PLT 486*  --   LABPROT  --  22.0*  INR  --  1.95  CREATININE 1.36*  --   TROPONINI <0.03  --     Estimated Creatinine Clearance: 21.4 mL/min (A) (by C-G formula based on SCr of 1.36 mg/dL (H)).   Medical History: Past Medical History:  Diagnosis Date  . Anxiety   . Arthritis   . Atrial fibrillation ([place])   . Constipation   . Depression   . Dyspnea   . Dysrhythmia    A-Fib  . GERD (gastroesophageal reflux disease)   . Hyperlipidemia   . Hypertension   . Hypothyroidism   . Nausea & vomiting   . T12 compression fracture ([place]) 06/2017  . Urinary frequency     Medications:  Medications Prior to Admission  Medication Sig Dispense Refill Last Dose  . ALPRAZolam (XANAX) 0.5 MG tablet Take 0.5 mg by mouth at bedtime.    11/11/2018 at Unknown time  . bisacodyl (DULCOLAX) 10 MG suppository Place 10 mg rectally every three (3) days as needed for moderate constipation.   Past Month at Unknown time  . DULoxetine (CYMBALTA) 20 MG capsule Take 20 mg by mouth daily.   11/11/2018 at Unknown time  . levothyroxine (SYNTHROID, LEVOTHROID) 75 MCG tablet Take 75 mcg by mouth daily before breakfast.   11/12/2018 at Unknown time  . metoprolol (TOPROL-XL) 25 MG 24 hr tablet Take 12.5 mg by  mouth at bedtime.    11/11/2018 at 2200  . omeprazole (PRILOSEC) 20 MG capsule Take 20 mg by mouth every morning.    11/12/2018 at Unknown time  . simvastatin (ZOCOR) 20 MG tablet Take 20 mg by mouth daily.   11/12/2018 at Unknown time  . warfarin (COUMADIN) 3 MG tablet Take 3 mg by mouth daily. Patient states that she is taking 3 mg daily   11/11/2018 at 1500    Assessment: Pharmacy consulted to dose warfarin in patient with atrial fibrillation.  INR on admission is 1.95.  Patient's home dose is listed as 3 mg daily.  Goal of Therapy:  INR 2-3 Monitor platelets by anticoagulation protocol: Yes   Plan:  Warfarin 3 mg x 1 dose. Monitor INR and s/s of bleeding.  Revonda Standard Esco Joslyn 11/12/2018,7:07 PM

## 2018-11-12 NOTE — ED Triage Notes (Signed)
Pt reports generalized weakness. Pt denies pain or recent fall.

## 2018-11-12 NOTE — H&P (Signed)
PCP:   Celene Squibb, MD   Chief Complaint:  Weakness.  HPI: This is a 84 year old female who presents after the family noted that 2 days ago she became very weak. She was hardly able to walk. There is no report of fevers, chills or confusion. There is no report of nausea or vomiting.  Per her son the patient has also had some increased nasal congestion.  She has history of confusion but has not been more confused over the past few days.  She has had increased somnolence.  She lives with her sons. She uses a walker at baseline and is normally able to move around. She has not been able to do this the last few days. Her sons took her to the ER today. All history provided by the sons, who are present at bedside. The patient is mostly asleep. CXR suggests possible pneumonia  Review of Systems:  The patient denies somnolence, anorexia, fever, weight loss,, vision loss, decreased hearing, hoarseness, chest pain, syncope, dyspnea on exertion, peripheral edema, balance deficits, hemoptysis, abdominal pain, melena, hematochezia, severe indigestion/heartburn, hematuria, incontinence, genital sores, muscle weakness, suspicious skin lesions, transient blindness, difficulty walking, depression, unusual weight change, abnormal bleeding, enlarged lymph nodes, angioedema, and breast masses.  Past Medical History: Past Medical History:  Diagnosis Date  . Anxiety   . Arthritis   . Atrial fibrillation ([place])   . Constipation   . Depression   . Dyspnea   . Dysrhythmia    A-Fib  . GERD (gastroesophageal reflux disease)   . Hyperlipidemia   . Hypertension   . Hypothyroidism   . Nausea & vomiting   . T12 compression fracture ([place]) 06/2017  . Urinary frequency    Past Surgical History:  Procedure Laterality Date  . COLONOSCOPY    . KYPHOPLASTY N/A 06/30/2018   Procedure: KYPHOPLASTY L1 and T12;  Surgeon: Melina Schools, MD;  Location: [facility];  Service: Orthopedics;  Laterality: N/A;  90 mins  . NM MYOCAR  PERF WALL MOTION  04/2006   dipyridamole; normal pattern of perfusion to all regions, post-stress EF 73%  . TONSILLECTOMY    . TRANSTHORACIC ECHOCARDIOGRAM  04/2012   EF=>55%, borderline conc LVH; LA mod dilated; RA mildly dilated; mild mitral annular calcif, mod MR; mild-mod TR with elevated RSVP 30-4mmHg, mild pulm HTN; mild calcif of AV leaflets, AV mildly sclerotic; aortic root sclerosis/calcif  . UPPER GASTROINTESTINAL ENDOSCOPY      Medications: Prior to Admission medications   Medication Sig Start Date End Date Taking? Authorizing Provider  ALPRAZolam Duanne Moron) 0.5 MG tablet Take 0.5 mg by mouth at bedtime.    Yes [provider]  bisacodyl (DULCOLAX) 10 MG suppository Place 10 mg rectally every three (3) days as needed for moderate constipation.   Yes [provider]  DULoxetine (CYMBALTA) 20 MG capsule Take 20 mg by mouth daily.   Yes [provider]  levothyroxine (SYNTHROID, LEVOTHROID) 75 MCG tablet Take 75 mcg by mouth daily before breakfast.   Yes [provider]  metoprolol (TOPROL-XL) 25 MG 24 hr tablet Take 12.5 mg by mouth at bedtime.    Yes [provider]  omeprazole (PRILOSEC) 20 MG capsule Take 20 mg by mouth every morning.    Yes [provider]  simvastatin (ZOCOR) 20 MG tablet Take 20 mg by mouth daily.   Yes [provider]  warfarin (COUMADIN) 3 MG tablet Take 3 mg by mouth daily. Patient states that she is taking 3 mg daily  Yes [provider]    Allergies:   Allergies  Allergen Reactions  . Adhesive [Tape] Other (See Comments)    REACTION: pulls skin  . Codeine     UNKNOWN REACTION  . Prednisone     UNKNOWN  . Sulfonamide Derivatives     UNKNOWN REACTION  . Vicodin [Hydrocodone-Acetaminophen]     "Intolerance "    Social History:  reports that she quit smoking about 33 years ago. She has never used smokeless tobacco. She reports that she does not drink alcohol or use  drugs.  Family History: Family History  Problem Relation Age of Onset  . Cancer Mother   . Ovarian cancer Mother   . Cancer Father   . Cancer Brother   . Emphysema Brother   . Emphysema Brother   . Pneumonia Brother   . Aneurysm Brother   . Healthy Son   . Sleep apnea Son   . Aortic aneurysm Unknown   . Kidney cancer Son     Physical Exam: Vitals:   11/12/18 1400 11/12/18 1423 11/12/18 1430 11/12/18 1500  BP: (!) 152/88  (!) 144/74 (!) 151/74  Pulse: 71  67 70  Resp: 14  18 17   Temp:  (!) 97.5 F (36.4 C)    TempSrc:  Oral    SpO2: 94%  93% 93%  Weight:      Height:        General: Somnolent but arousable.  Patient appears confused, weak, no acute distress, nontoxic Eyes: PERRLA, pink conjunctiva, no scleral icterus ENT: Moist oral mucosa, neck supple, no thyromegaly Lungs: clear to ascultation, no wheeze, no crackles, no use of accessory muscles Cardiovascular: regular rate and rhythm, no regurgitation, no gallops, no murmurs. No carotid bruits, no JVD Abdomen: soft, positive BS, non-tender, non-distended, no organomegaly, not an acute abdomen GU: not examined Neuro: Unable to properly assess due to patient's somnolence Musculoskeletal: Unable to properly assess due to patient's not following instructions Skin: no rash, no subcutaneous crepitation, no decubitus Psych: Demented   Labs on Admission:  Recent Labs    11/12/18 1109  NA 133*  K 3.7  CL 101  CO2 23  GLUCOSE 128*  BUN 29*  CREATININE 1.36*  CALCIUM 8.7*   No results for input(s): AST, ALT, ALKPHOS, BILITOT, PROT, ALBUMIN in the last 72 hours. No results for input(s): LIPASE, AMYLASE in the last 72 hours. Recent Labs    11/12/18 1109  WBC 12.1*  HGB 11.9*  HCT 37.0  MCV 89.2  PLT 486*   Recent Labs    11/12/18 1109  TROPONINI <0.03   Invalid input(s): POCBNP No results for input(s): DDIMER in the last 72 hours. No results for input(s): HGBA1C in the last 72 hours. No results for  input(s): CHOL, HDL, LDLCALC, TRIG, CHOLHDL, LDLDIRECT in the last 72 hours. No results for input(s): TSH, T4TOTAL, T3FREE, THYROIDAB in the last 72 hours.  Invalid input(s): FREET3 No results for input(s): VITAMINB12, FOLATE, FERRITIN, TIBC, IRON, RETICCTPCT in the last 72 hours.  Micro Results: No results found for this or any previous visit (from the past 240 hour(s)).   Radiological Exams on Admission: Dg Chest Port 1 View  Result Date: 11/12/2018 CLINICAL DATA:  Cough.  Weakness.  Atrial fibrillation. EXAM: PORTABLE CHEST 1 VIEW COMPARISON:  09/12/2017 and 02/25/2013 FINDINGS: There is a new focal area of infiltrate in the lateral aspect of the right upper lobe adjacent to the minor fissure. Heart size and pulmonary vascularity are  normal. No appreciable effusions. No acute bone abnormality. Aortic atherosclerosis. IMPRESSION: Faint infiltrate in the right upper lobe which may represent pneumonia. Aortic Atherosclerosis (ICD10-I70.0). Electronically Signed   By: Lorriane Shire M.D.   On: 11/12/2018 11:29    Assessment/Plan Present on Admission: . Community acquired pneumonia/weakness -Admit to [facility] -Blood cultures and urine cultures ordered -Pneumonia order set -IV antibiotics, oxygen, nebulizers -Respiratory to evaluate and treat  Dementia with acute encephalopathy -Treating underlying cause -Patient somnolent but easily arousable.  Obtain ABG and monitor  . Atrial fibrillation ([place]) -Stable, home meds resumed, including Coumadin.  Pharmacy to manage  . Essential hypertension -Stable, home meds resumed  . HYPERCHOLESTEROLEMIA -Stable, home meds resumed  . Hypothyroidism -Stable, home meds resumed -TSH ordered  Chronic kidney disease -Stable, at baseline  . T12 compression fracture ([place]) -  Murle Hellstrom 11/12/2018, 3:38 PM

## 2018-11-13 ENCOUNTER — Encounter (HOSPITAL_COMMUNITY): Payer: Self-pay

## 2018-11-13 DIAGNOSIS — J181 Lobar pneumonia, unspecified organism: Secondary | ICD-10-CM

## 2018-11-13 DIAGNOSIS — Z7901 Long term (current) use of anticoagulants: Secondary | ICD-10-CM

## 2018-11-13 DIAGNOSIS — I10 Essential (primary) hypertension: Secondary | ICD-10-CM

## 2018-11-13 DIAGNOSIS — R531 Weakness: Secondary | ICD-10-CM

## 2018-11-13 LAB — BASIC METABOLIC PANEL
Anion gap: 9 (ref 5–15)
BUN: 25 mg/dL — ABNORMAL HIGH (ref 8–23)
CO2: 22 mmol/L (ref 22–32)
Calcium: 8.3 mg/dL — ABNORMAL LOW (ref 8.9–10.3)
Chloride: 106 mmol/L (ref 98–111)
Creatinine, Ser: 1 mg/dL (ref 0.44–1.00)
GFR calc Af Amer: 56 mL/min — ABNORMAL LOW (ref >60)
GFR calc non Af Amer: 49 mL/min — ABNORMAL LOW (ref >60)
Glucose, Bld: 104 mg/dL — ABNORMAL HIGH (ref 70–99)
Potassium: 3.5 mmol/L (ref 3.5–5.1)
Sodium: 137 mmol/L (ref 135–145)

## 2018-11-13 LAB — CBC
HCT: 38.2 % (ref 36.0–46.0)
Hemoglobin: 12.2 g/dL (ref 12.0–15.0)
MCH: 29 pg (ref 26.0–34.0)
MCHC: 31.9 g/dL (ref 30.0–36.0)
MCV: 90.7 fL (ref 80.0–100.0)
Platelets: 505 10*3/uL — ABNORMAL HIGH (ref 150–400)
RBC: 4.21 MIL/uL (ref 3.87–5.11)
RDW: 17.6 % — ABNORMAL HIGH (ref 11.5–15.5)
WBC: 10.6 10*3/uL — ABNORMAL HIGH (ref 4.0–10.5)
nRBC: 0 % (ref 0.0–0.2)

## 2018-11-13 LAB — PROTIME-INR
INR: 1.86
Prothrombin Time: 21.2 seconds — ABNORMAL HIGH (ref 11.4–15.2)

## 2018-11-13 LAB — TSH: TSH: 1.401 u[IU]/mL (ref 0.350–4.500)

## 2018-11-13 LAB — HIV ANTIBODY (ROUTINE TESTING W REFLEX): HIV Screen 4th Generation wRfx: NONREACTIVE

## 2018-11-13 LAB — STREP PNEUMONIAE URINARY ANTIGEN: Strep Pneumo Urinary Antigen: NEGATIVE

## 2018-11-13 MED ORDER — WARFARIN SODIUM 2.5 MG PO TABS
4.5000 mg | ORAL_TABLET | Freq: Once | ORAL | Status: AC
  Administered 2018-11-13: 4.5 mg via ORAL
  Filled 2018-11-13: qty 1

## 2018-11-13 MED ORDER — IBUPROFEN 100 MG/5ML PO SUSP: 100.0000 mg | Freq: Three times a day (TID) | ORAL | Status: DC | PRN

## 2018-11-13 MED ORDER — AZITHROMYCIN 250 MG PO TABS
500.0000 mg | ORAL_TABLET | Freq: Every day | ORAL | Status: DC
  Administered 2018-11-14: 500 mg via ORAL
  Filled 2018-11-13: qty 2

## 2018-11-13 MED ORDER — ENSURE ENLIVE PO LIQD
237.0000 mL | Freq: Two times a day (BID) | ORAL | Status: DC
  Administered 2018-11-14: 237 mL via ORAL

## 2018-11-13 NOTE — Progress Notes (Signed)
PROGRESS NOTE    Leslie Rosario   QJJ:[id number]  DOB: March 01, 1923  DOA: 11/12/2018 PCP: Celene Squibb, MD   Brief Narrative:  Leslie Rosario is a 84 y/o with A-fib on Coumadin, HTN,  hypothyroidism who presents from home for generalized weakness (could not walk by heself), somnolence and nasal congestion for 2 days. In ED: CXR >  Faint infiltrate in the right upper lobe which may represent pneumonia.  WBC 12.1  BUN 29, Cr 1.36 Started on Ceftriaxone and Azithromycin  Subjective: Mild cough which is only 1/ as bad as yesterday.  ROS: no complaints of nausea, vomiting, constipation diarrhea, dyspnea or dysuria. No other complaints.   Assessment & Plan:   Principal Problem:   Community acquired pneumonia leading to severe generalized weakness - cont IV antibiotics - obtain PT eval ASAP  Active Problems: CKD 3, mild hyponatremia - suspect she was dehydrated - based on prior Cr, she ranges from 0.9- 1.3 and has improved to 1 today-      Chronic Atrial fibrillation, HTN   Long term current use of anticoagulant therapy - rate controlled- d/c telemetry - cont Toprolol - INR is low at 1.86- Coumadin per pharmacy    Hypothyroidism - cont Synthroid   HLD - cont statin  DVT prophylaxis: SCDs Coumadin Code Status: Full code Family Communication:  Disposition Plan: home possibly tomorrow if weakness improves Consultants:   none Procedures:   none Antimicrobials:  Anti-infectives (From admission, onward)   Start     Dose/Rate Route Frequency Ordered Stop   11/13/18 1600  cefTRIAXone (ROCEPHIN) 1 g in sodium chloride 0.9 % 100 mL IVPB     1 g 200 mL/hr over 30 Minutes Intravenous Every 24 hours 11/12/18 1755 11/20/18 1559   11/12/18 1430  azithromycin (ZITHROMAX) 500 mg in sodium chloride 0.9 % 250 mL IVPB     500 mg 250 mL/hr over 60 Minutes Intravenous Every 24 hours 11/12/18 1424     11/12/18 1430  cefTRIAXone (ROCEPHIN) 1 g in sodium chloride 0.9 % 100 mL IVPB     1  g 200 mL/hr over 30 Minutes Intravenous  Once 11/12/18 1424 11/12/18 1611       Objective: Vitals:   11/12/18 1726 11/12/18 2147 11/13/18 0609 11/13/18 1353  BP: 139/88 (!) 146/73 (!) 145/79 (!) 149/88  Pulse: 74 80 87 97  Resp: 20 18 20 17   Temp: 97.9 F (36.6 C) 98 F (36.7 C) 97.7 F (36.5 C)   TempSrc: Oral Oral Oral   SpO2: 95% 96% 92% 99%  Weight: 56 kg     Height: 5\' 3"  (1.6 m)       Intake/Output Summary (Last 24 hours) at 11/13/2018 1405 Last data filed at 11/13/2018 0900 Gross per 24 hour  Intake 1100 ml  Output 150 ml  Net 950 ml   Filed Weights   11/12/18 1038 11/12/18 1726  Weight: 56.2 kg 56 kg    Examination: General exam: Appears comfortable - very weak, has trouble moving around in the bed on her own HEENT: PERRLA, oral mucosa moist, no sclera icterus or thrush Respiratory system: Clear to auscultation. Respiratory effort normal. Cardiovascular system: S1 & S2 heard, RRR.   Gastrointestinal system: Abdomen soft, non-tender, nondistended. Normal bowel sounds. Extremities: No cyanosis, clubbing or edema Skin: No rashes or ulcers Psychiatry:  Mood & affect appropriate.     Data Reviewed: I have personally reviewed following labs and imaging studies  CBC: Recent Labs  Lab  11/12/18 1109 11/13/18 0642  WBC 12.1* 10.6*  HGB 11.9* 12.2  HCT 37.0 38.2  MCV 89.2 90.7  PLT 486* 161*   Basic Metabolic Panel: Recent Labs  Lab 11/12/18 1109 11/13/18 0642  NA 133* 137  K 3.7 3.5  CL 101 106  CO2 23 22  GLUCOSE 128* 104*  BUN 29* 25*  CREATININE 1.36* 1.00  CALCIUM 8.7* 8.3*   GFR: Estimated Creatinine Clearance: 29.1 mL/min (by C-G formula based on SCr of 1 mg/dL). Liver Function Tests: No results for input(s): AST, ALT, ALKPHOS, BILITOT, PROT, ALBUMIN in the last 168 hours. No results for input(s): LIPASE, AMYLASE in the last 168 hours. No results for input(s): AMMONIA in the last 168 hours. Coagulation Profile: Recent Labs  Lab  11/12/18 1728 11/13/18 0642  INR 1.95 1.86   Cardiac Enzymes: Recent Labs  Lab 11/12/18 1109  TROPONINI <0.03   BNP (last 3 results) No results for input(s): PROBNP in the last 8760 hours. HbA1C: No results for input(s): HGBA1C in the last 72 hours. CBG: No results for input(s): GLUCAP in the last 168 hours. Lipid Profile: No results for input(s): CHOL, HDL, LDLCALC, TRIG, CHOLHDL, LDLDIRECT in the last 72 hours. Thyroid Function Tests: Recent Labs    11/13/18 0642  TSH 1.401   Anemia Panel: No results for input(s): VITAMINB12, FOLATE, FERRITIN, TIBC, IRON, RETICCTPCT in the last 72 hours. Urine analysis:    Component Value Date/Time   COLORURINE YELLOW 11/12/2018 1041   APPEARANCEUR HAZY (A) 11/12/2018 1041   LABSPEC 1.013 11/12/2018 1041   PHURINE 5.0 11/12/2018 1041   GLUCOSEU NEGATIVE 11/12/2018 1041   HGBUR NEGATIVE 11/12/2018 [place] 11/12/2018 [place] 11/12/2018 1041   PROTEINUR 30 (A) 11/12/2018 1041   UROBILINOGEN 0.2 09/26/2014 2240   NITRITE NEGATIVE 11/12/2018 1041   LEUKOCYTESUR NEGATIVE 11/12/2018 1041   Sepsis Labs: @LABRCNTIP (procalcitonin:4,lacticidven:4) ) Recent Results (from the past 240 hour(s))  Culture, blood (Routine X 2) w Reflex to ID Panel     Status: None (Preliminary result)   Collection Time: 11/12/18  5:37 PM  Result Value Ref Range Status   Specimen Description LEFT ANTECUBITAL  Final   Special Requests   Final    BOTTLES DRAWN AEROBIC AND ANAEROBIC Blood Culture adequate volume   Culture   Final    NO GROWTH < 12 HOURS Performed at [facility], [address], [place], [place] [address]    Report Status PENDING  Incomplete  Culture, blood (Routine X 2) w Reflex to ID Panel     Status: None (Preliminary result)   Collection Time: 11/12/18  5:39 PM  Result Value Ref Range Status   Specimen Description BLOOD LEFT ARM  Final   Special Requests   Final    BOTTLES DRAWN AEROBIC AND  ANAEROBIC Blood Culture adequate volume   Culture   Final    NO GROWTH < 12 HOURS Performed at [facility], [address], [place], [place] [address]    Report Status PENDING  Incomplete         Radiology Studies: Dg Chest Port 1 View  Result Date: 11/12/2018 CLINICAL DATA:  Cough.  Weakness.  Atrial fibrillation. EXAM: PORTABLE CHEST 1 VIEW COMPARISON:  09/12/2017 and 02/25/2013 FINDINGS: There is a new focal area of infiltrate in the lateral aspect of the right upper lobe adjacent to the minor fissure. Heart size and pulmonary vascularity are normal. No appreciable effusions. No acute bone abnormality. Aortic atherosclerosis.  IMPRESSION: Faint infiltrate in the right upper lobe which may represent pneumonia. Aortic Atherosclerosis (ICD10-I70.0). Electronically Signed   By: Lorriane Shire M.D.   On: 11/12/2018 11:29      Scheduled Meds: . feeding supplement (ENSURE ENLIVE)  237 mL Oral BID BM  . levothyroxine  75 mcg Oral Q0600  . metoprolol succinate  12.5 mg Oral QHS  . pantoprazole  40 mg Oral Daily  . simvastatin  20 mg Oral q1800  . warfarin  4.5 mg Oral Once  . Warfarin - Pharmacist Dosing Inpatient   Does not apply q1800   Continuous Infusions: . azithromycin 500 mg (11/12/18 1617)  . cefTRIAXone (ROCEPHIN)  IV       LOS: 1 day    Time spent in minutes: 35    Debbe Odea, MD [facility] Pager: [email or web address] Password [email or web address] 11/13/2018, 2:05 PM

## 2018-11-13 NOTE — Progress Notes (Signed)
Initial Nutrition Assessment  DOCUMENTATION CODES:  Not applicable  INTERVENTION:  Ensure Enlive po BID, each supplement provides 350 kcal and 20 grams of protein  Will monitor intake and follow up/adjusted supplements as indicated  NUTRITION DIAGNOSIS:  Increased nutrient needs related to acute illness(CAP) as evidenced by estimated nutritional requirements for this condition   GOAL:  Patient will meet greater than or equal to 90% of their needs  MONITOR:  PO intake, Supplement acceptance, Labs, Weight trends, I & O's, Skin  REASON FOR ASSESSMENT:  Malnutrition Screening Tool    ASSESSMENT:  84 y/o female PMHx Dementia, Anxiety, depression, CKD3, GERD, Afib, HTN/HLD. Brought by family d/t 2 days of severe weakness, somnolence and decreased PO intake. CXR suggest CAP and pt admitted for management.   RD operating remotely on weekends. Per review of chart, pt has had poor intake for several days which appears to be secondary to weakness/somnolence in setting of PNA. Anticipate intake will improve as underlying PNA is treated.   Long term, Pt does appear to have lost some weight over the past 6 months. She was admitted at 124 lbs and appears to have been weighing 133-134 lbs this past June-July.   This is a loss of 10 lbs or 7.5% in ~4-5 months. She has wt of 130 in Sept, though this wt is suspicious for being reported/estimated.   Question if pts underlying dementia has been progressing  and contributing to wt loss or if she has lost all this weight acutely.   Will add oral supplements to help pt meet acutely heightened kcal/pro needs  Labs: Glu: 104, WBC:10.6, Bun/creat:29/1.36 -> 25/1.0 Meds: PPI, warfarin, IV abx  Recent Labs  Lab 11/12/18 1109 11/13/18 0642  NA 133* 137  K 3.7 3.5  CL 101 106  CO2 23 22  BUN 29* 25*  CREATININE 1.36* 1.00  CALCIUM 8.7* 8.3*  GLUCOSE 128* 104*   NUTRITION - FOCUSED PHYSICAL EXAM: Unable to conduct  Diet Order:   Diet Order           Diet regular Room service appropriate? Yes; Fluid consistency: Thin  Diet effective now             EDUCATION NEEDS:  No education needs have been identified at this time  Skin:  Skin Assessment: Reviewed RN Assessment  Last BM:  11/29  Height:  Ht Readings from Last 1 Encounters:  11/12/18 5\' 3"  (1.6 m)   Weight:  Wt Readings from Last 1 Encounters:  11/12/18 56 kg   Wt Readings from Last 10 Encounters:  11/12/18 56 kg  08/24/18 59 kg  06/30/18 60.8 kg  06/25/18 60.7 kg  06/08/18 61.1 kg  04/20/18 61 kg  09/13/17 60.8 kg  07/03/17 64.9 kg  04/08/17 65.4 kg  07/21/16 66.2 kg   Ideal Body Weight:  52.27 kg  BMI:  Body mass index is 21.87 kg/m.  Estimated Nutritional Needs:  Kcal:  1500-1700 kcals (27-30 kcal/kg bw) Protein:  78-90g Pro (1.4-1.6g/kg bw) Fluid:  1.4-1.7 L fluid (25-30 ml/kg bw)  Burtis Junes RD, LDN, CNSC Clinical Nutrition Available Tues-Sat via Pager: [phone] 11/13/2018 2:03 PM

## 2018-11-13 NOTE — Progress Notes (Signed)
ANTICOAGULATION CONSULT NOTE - Follow Up Consult  Pharmacy Consult for warfarin dosing Indication: atrial fibrillation   INR goal: 2-3 INR today: 1.83 Home dose: warfarin 3mg  daily Drug Interactions:  Azithromycin Concurrent anti-platelet meds: ibuprofen (prn)   Patient Measurements: Height: 5\' 3"  (160 cm) Weight: 123 lb 7.3 oz (56 kg) IBW/kg (Calculated) : 52.4 Heparin Dosing Weight:   Vital Signs: Temp: 97.7 F (36.5 C) (11/30 0609) Temp Source: Oral (11/30 0609) BP: 145/79 (11/30 0609) Pulse Rate: 87 (11/30 0609)  Labs: Recent Labs    11/12/18 1109 11/12/18 1728 11/13/18 0642  HGB 11.9*  --  12.2  HCT 37.0  --  38.2  PLT 486*  --  505*  LABPROT  --  22.0* 21.2*  INR  --  1.95 1.86  CREATININE 1.36*  --  1.00  TROPONINI <0.03  --   --     Estimated Creatinine Clearance: 29.1 mL/min (by C-G formula based on SCr of 1 mg/dL).     Assessment:   Pharmacy consulted to dose warfarin for this 89 yof on chronic anti-coagulation with warfarin 3mg  daily. INR today is sub-therapeutic, so will give boost dose today to reach INR goal.    Goal of Therapy:  INR 2-3 Monitor platelets by anticoagulation protocol: Yes   Plan:  Give warfarin 4.5mg  today (50% > usual home dose ) Continue to monitor pertinent labs.  Despina Pole, Pharm. D. Clinical Pharmacist 11/13/2018 10:04 AM

## 2018-11-14 LAB — PROTIME-INR
INR: 1.8
Prothrombin Time: 20.7 seconds — ABNORMAL HIGH (ref 11.4–15.2)

## 2018-11-14 MED ORDER — BENZONATATE 100 MG PO CAPS: 100.0000 mg | ORAL_CAPSULE | Freq: Three times a day (TID) | ORAL | 0 refills | Status: DC | PRN

## 2018-11-14 MED ORDER — IPRATROPIUM-ALBUTEROL 20-100 MCG/ACT IN AERS: 1.0000 | INHALATION_SPRAY | Freq: Four times a day (QID) | RESPIRATORY_TRACT | 0 refills | Status: DC | PRN

## 2018-11-14 MED ORDER — WARFARIN SODIUM 5 MG PO TABS
5.0000 mg | ORAL_TABLET | Freq: Once | ORAL | Status: AC
  Administered 2018-11-14: 5 mg via ORAL
  Filled 2018-11-14: qty 1

## 2018-11-14 MED ORDER — AMOXICILLIN-POT CLAVULANATE 500-125 MG PO TABS: 1.0000 | ORAL_TABLET | Freq: Two times a day (BID) | ORAL | 0 refills | Status: AC

## 2018-11-14 MED ORDER — ALPRAZOLAM 0.5 MG PO TABS: 0.5000 mg | ORAL_TABLET | Freq: Every evening | ORAL | 0 refills | Status: DC | PRN

## 2018-11-14 MED ORDER — ENSURE ENLIVE PO LIQD: 237.0000 mL | Freq: Two times a day (BID) | ORAL | Status: AC

## 2018-11-14 NOTE — Evaluation (Signed)
Physical Therapy Evaluation Patient Details Name: Leslie Rosario MRN: [id number] DOB: 10/05/1923 Today's Date: 11/14/2018   History of Present Illness  This is a 84 year old female who presents after the family noted that 2 days ago she became very weak. She was hardly able to walk. There is no report of fevers, chills or confusion. There is no report of nausea or vomiting.  Per her son the patient has also had some increased nasal congestion.  She has history of confusion but has not been more confused over the past few days.  She has had increased somnolence.  She lives with her sons. She uses a walker at baseline and is normally able to move around. She has not been able to do this the last few days. Her sons took her to the ER today. All history provided by the sons, who are present at bedside. The patient is mostly asleep. CXR suggests possible pneumonia  Clinical Impression  Ms. Riner's son states that someone is with pt at all times.  States that her activity level is basically walking from her bedroom to the living room.  Pt is needs min assist for bed mobility at this time and would benefit from improved walking tolerance to improve safety at home therefore home health is recommended.     Follow Up Recommendations Home health PT    Equipment Recommendations  3in1 (PT)    Recommendations for Other Services   none    Precautions / Restrictions Precautions Precautions: None Restrictions Weight Bearing Restrictions: No      Mobility  Bed Mobility Overal bed mobility: Needs Assistance Bed Mobility: Supine to Sit     Supine to sit: Min assist        Transfers Overall transfer level: Needs assistance Equipment used: Rolling walker (2 wheeled) Transfers: Stand Pivot Transfers   Stand pivot transfers: Supervision          Ambulation/Gait Ambulation/Gait assistance: Supervision Gait Distance (Feet): 60 Feet Assistive device: Rolling walker (2 wheeled) Gait  Pattern/deviations: WFL(Within Functional Limits) Gait velocity: decreased but near normal for 93          Balance                                             Pertinent Vitals/Pain Pain Assessment: No/denies pain    Home Living Family/patient expects to be discharged to:: Private residence   Available Help at Discharge: Family Type of Home: House Home Access: Level entry     Home Layout: One level Home Equipment: Environmental consultant - standard      Prior Function   PT lives with sons and is walks minimally in the home with a rolling walker.               Hand Dominance   Dominant Hand: Right    Extremity/Trunk Assessment   Upper Extremity Assessment Upper Extremity Assessment: Defer to OT evaluation    Lower Extremity Assessment Lower Extremity Assessment: Overall WFL for tasks assessed       Communication   Communication: No difficulties  Cognition Arousal/Alertness: Awake/alert Behavior During Therapy: WFL for tasks assessed/performed Overall Cognitive Status: Within Functional Limits for tasks assessed  Exercises General Exercises - Lower Extremity Ankle Circles/Pumps: AROM;10 reps Quad Sets: AROM;10 reps Gluteal Sets: AROM;5 reps Heel Slides: AROM;10 reps Hip ABduction/ADduction: AROM;5 reps   Assessment/Plan    PT Assessment All further PT needs can be met in the next venue of care;Patient needs continued PT services  PT Problem List Decreased strength;Decreased activity tolerance       PT Treatment Interventions Gait training;Therapeutic activities;Therapeutic exercise    PT Goals (Current goals can be found in the Care Plan section)       Frequency Min 3X/week   Barriers to discharge              End of Session Equipment Utilized During Treatment: Gait belt Activity Tolerance: Patient tolerated treatment well Patient left: in chair;with call bell/phone within  reach;with family/visitor present   PT Visit Diagnosis: Unsteadiness on feet (R26.81);Muscle weakness (generalized) (M62.81)    Time: 7290-2111 PT Time Calculation (min) (ACUTE ONLY): 39 min   Charges:   PT Evaluation $PT Eval Low Complexity: 1 Low PT Treatments $Therapeutic Exercise: 8-22 mins        Rayetta Humphrey, PT CLT [phone] 11/14/2018, 11:29 AM

## 2018-11-14 NOTE — Progress Notes (Signed)
ANTICOAGULATION CONSULT NOTE - Follow Up Consult  Pharmacy Consult for warfarin dosing Indication: atrial fibrillation   INR goal: 2-3 INR today: 1.8 Home dose: warfarin 3mg  daily Drug Interactions:  Azithromycin Concurrent anti-platelet meds: ibuprofen (prn)   Patient Measurements: Height: 5\' 3"  (160 cm) Weight: 123 lb 7.3 oz (56 kg) IBW/kg (Calculated) : 52.4 Heparin Dosing Weight:   Vital Signs: Temp: 97.7 F (36.5 C) (12/01 0510) Temp Source: Oral (12/01 0510) BP: 154/92 (12/01 0510) Pulse Rate: 79 (12/01 0510)  Labs: Recent Labs    11/12/18 1109 11/12/18 1728 11/13/18 0642 11/14/18 0634  HGB 11.9*  --  12.2  --   HCT 37.0  --  38.2  --   PLT 486*  --  505*  --   LABPROT  --  22.0* 21.2* 20.7*  INR  --  1.95 1.86 1.80  CREATININE 1.36*  --  1.00  --   TROPONINI <0.03  --   --   --     Estimated Creatinine Clearance: 29.1 mL/min (by C-G formula based on SCr of 1 mg/dL).     Assessment:  Pharmacy consulted to dose warfarin for this 62 yof on chronic anti-coagulation with warfarin 3mg  daily. Will give another  boost dose today as INR is not in range yet.   Goal of Therapy:  INR 2-3 Monitor platelets by anticoagulation protocol: Yes   Plan:  Give warfarin5mg  today Continue to monitor pertinent labs.  Despina Pole, Pharm. D. Clinical Pharmacist 11/14/2018 12:00 PM

## 2018-11-14 NOTE — Discharge Instructions (Signed)
SEE PRIMARY MD IN ONE OR TWO WEEKS AND HAVE LABS (BMET) HAVE CHEST XRAY IN 4-6 WEEKS

## 2018-11-14 NOTE — Clinical Social Work Note (Signed)
CSW has updated the [facility] of discharge with home health ordered. RNCM will follow up with the patient's family when able. The patient has active orders for home health and DME. CSW is following to assist as able.  Santiago Bumpers, MSW, Latanya Presser [phone]

## 2018-11-14 NOTE — Care Management Note (Addendum)
Case Management Note  Patient Details  Name: Leslie Rosario MRN: [id number] Date of Birth: [birth year]  Subjective/Objective:  Patient to be discharged per MD order. Orders in place for home health services. Patient agreeable to home health, spoke primarily with patients children, [name] and Richardson Landry with whom the patient lives. They think [name] is a Dentist. Referral placed with [name] and confirms she does cover [place]. Orders for [facility] but patient has one as well as walker. Family to provide transport, no further needs.                    Action/Plan:   Expected Discharge Date:  11/14/18               Expected Discharge Plan:  [place]  In-House Referral:     Discharge planning Services  CM Consult  Post Acute Care Choice:  Home Health Choice offered to:  Patient, Adult Children  DME Arranged:    DME Agency:     HH Arranged:  RN, PT [place] Agency:  [facility]  Status of Service:  Completed, signed off  If discussed at [place] of Stay Meetings, dates discussed:    Additional Comments:  Latanya Maudlin, RN 11/14/2018, 3:33 PM

## 2018-11-14 NOTE — Discharge Summary (Signed)
Physician Discharge Summary  Leslie Rosario ZJQ:[id number] DOB: 04-Jan-1923 DOA: 11/12/2018  PCP: Celene Squibb, MD  Admit date: 11/12/2018 Discharge date: 11/14/2018  Time spent: 35 minutes  Recommendations for Outpatient Follow-up:  1. Repeat basic metabolic panel to follow electrolytes and renal function 2. Repeat chest x-ray 8 in 4-6 weeks to assure complete resolution of infiltrates   Discharge Diagnoses:  Principal Problem:   Community acquired pneumonia Active Problems:   HYPERCHOLESTEROLEMIA   Atrial fibrillation ([place])   Long term current use of anticoagulant therapy   Hypothyroidism   Essential hypertension   Weakness   Pneumonia   Discharge Condition: Stable and improved.  Patient discharged home with instruction to follow-up with PCP in 10 days.  Home health services for physical therapy and home health nurse has been arranged at discharge.  Diet recommendation: Patient instructed to keep herself well-hydrated and to monitor sodium intake.  Filed Weights   11/12/18 1038 11/12/18 1726  Weight: 56.2 kg 56 kg    History of present illness:  As per H&P written by Dr. Claria Dice on 11/12/2017. 84 year old female who presents after the family noted that 2 days ago she became very weak. She was hardly able to walk. There is no report of fevers, chills or confusion. There is no report of nausea or vomiting.  Per her son the patient has also had some increased nasal congestion.  She has history of confusion but has not been more confused over the past few days.  She has had increased somnolence.  She lives with her sons. She uses a walker at baseline and is normally able to move around. She has not been able to do this the last few days. Her sons took her to the ER today. All history provided by the sons, who are present at bedside. The patient is mostly asleep. CXR suggests possible pneumonia  Hospital Course:  1-community-acquired pneumonia leading to severe generalized  weakness -Patient with good oxygen saturation on room air at time of discharge -Afebrile and able to take medications by mouth -Will be discharged on Augmentin 500 mg every 12 hours for 5 more days to complete treatment -PRN Combivent for shortness of breath and wheezing -Follow-up with PCP in 10 days -Recommending repeat chest x-ray in 4-6 weeks to assure resolution of infiltrates.  2-physical deconditioning -In the setting of pneumonia -Seen by physical therapy and recommendations given for home health PT -3 in 1 DME also ordered at discharge  3-chronic atrial fibrillation -Stable and with rate control -Will continue Toprol -Patient chronically on Coumadin which will be continue (goal INR 2-3) -She will be discharged on 3 mg of Coumadin daily and will follow up with Coumadin clinic in the next day or so.  4-hypothyroidism -Continue Synthroid.  5-essential hypertension -Stable and well-controlled -Patient instructed to monitor sodium intake -Continue beta-blocker for blood pressure control.  6-hyperlipidemia -Continue statins.  7-chronic kidney disease with mild hyponatremia -In the setting of mild dehydration -Improved/resolved after fluid resuscitation -Will recommend repeat basic metabolic panel follow-up visit to reassess renal function trend. -Patient advised to keep herself well-hydrated.  Procedures:  See below for x-ray reports.  Consultations:  None  Discharge Exam: Vitals:   11/14/18 0510 11/14/18 1322  BP: (!) 154/92 136/71  Pulse: 79 86  Resp:  16  Temp: 97.7 F (36.5 C) 98.6 F (37 C)  SpO2: 92% 93%    General: Afebrile, no chest pain, able to speak in full sentences, good oxygen saturation on room air.  Cardiovascular: Rate controlled, no JVD, no rubs, no gallops, no murmurs. Respiratory: Good air movement bilaterally, no wheezing, no crackles.  Positive for scattered rhonchi (right more than left). Abdomen: Soft, nontender, nondistended,  positive bowel sounds. Extremities: No edema, no cyanosis, no clubbing.  Discharge Instructions   Discharge Instructions    Diet - low sodium heart healthy   Complete by:  As directed    Discharge instructions   Complete by:  As directed    Take medications as prescribed Keep yourself well-hydrated Arrange follow-up with PCP in 10 days Please make sure that you follow-up at the Coumadin clinic to reassess INR level. Follow heart healthy diet Follow instructions and recommendations by home health providers.     Allergies as of 11/14/2018      Reactions   Adhesive [tape] Other (See Comments)   REACTION: pulls skin   Codeine    UNKNOWN REACTION   Prednisone    UNKNOWN   Sulfonamide Derivatives    UNKNOWN REACTION   Vicodin [hydrocodone-acetaminophen]    "Intolerance "      Medication List    TAKE these medications   ALPRAZolam 0.5 MG tablet Commonly known as:  XANAX Take 1 tablet (0.5 mg total) by mouth at bedtime as needed for anxiety or sleep. What changed:    when to take this  reasons to take this   amoxicillin-clavulanate 500-125 MG tablet Commonly known as:  AUGMENTIN Take 1 tablet (500 mg total) by mouth 2 (two) times daily for 5 days.   benzonatate 100 MG capsule Commonly known as:  TESSALON Take 1 capsule (100 mg total) by mouth 3 (three) times daily as needed for cough.   bisacodyl 10 MG suppository Commonly known as:  DULCOLAX Place 10 mg rectally every three (3) days as needed for moderate constipation.   DULoxetine 20 MG capsule Commonly known as:  CYMBALTA Take 20 mg by mouth daily.   feeding supplement (ENSURE ENLIVE) Liqd Take 237 mLs by mouth 2 (two) times daily between meals.   Ipratropium-Albuterol 20-100 MCG/ACT Aers respimat Commonly known as:  COMBIVENT Inhale 1 puff into the lungs every 6 (six) hours as needed for wheezing or shortness of breath.   levothyroxine 75 MCG tablet Commonly known as:  SYNTHROID, LEVOTHROID Take 75 mcg  by mouth daily before breakfast.   metoprolol succinate 25 MG 24 hr tablet Commonly known as:  TOPROL-XL Take 12.5 mg by mouth at bedtime.   omeprazole 20 MG capsule Commonly known as:  PRILOSEC Take 20 mg by mouth every morning.   simvastatin 20 MG tablet Commonly known as:  ZOCOR Take 20 mg by mouth daily.   warfarin 3 MG tablet Commonly known as:  COUMADIN Take 3 mg by mouth daily. Patient states that she is taking 3 mg daily            Durable Medical Equipment  (From admission, onward)         Start     Ordered   11/14/18 1427  For home use only DME 3 n 1  Once     11/14/18 1426         Allergies  Allergen Reactions  . Adhesive [Tape] Other (See Comments)    REACTION: pulls skin  . Codeine     UNKNOWN REACTION  . Prednisone     UNKNOWN  . Sulfonamide Derivatives     UNKNOWN REACTION  . Vicodin [Hydrocodone-Acetaminophen]     "Intolerance "   Follow-up Information  Celene Squibb, MD. Schedule an appointment as soon as possible for a visit in 10 day(s).   Specialty:  Internal Medicine Contact information: [place] [address] [phone]           The results of significant diagnostics from this hospitalization (including imaging, microbiology, ancillary and laboratory) are listed below for reference.    Significant Diagnostic Studies: Dg Chest Port 1 View  Result Date: 11/12/2018 CLINICAL DATA:  Cough.  Weakness.  Atrial fibrillation. EXAM: PORTABLE CHEST 1 VIEW COMPARISON:  09/12/2017 and 02/25/2013 FINDINGS: There is a new focal area of infiltrate in the lateral aspect of the right upper lobe adjacent to the minor fissure. Heart size and pulmonary vascularity are normal. No appreciable effusions. No acute bone abnormality. Aortic atherosclerosis. IMPRESSION: Faint infiltrate in the right upper lobe which may represent pneumonia. Aortic Atherosclerosis (ICD10-I70.0). Electronically Signed   By: Lorriane Shire M.D.   On:  11/12/2018 11:29    Microbiology: Recent Results (from the past 240 hour(s))  Culture, blood (Routine X 2) w Reflex to ID Panel     Status: None (Preliminary result)   Collection Time: 11/12/18  5:37 PM  Result Value Ref Range Status   Specimen Description LEFT ANTECUBITAL  Final   Special Requests   Final    BOTTLES DRAWN AEROBIC AND ANAEROBIC Blood Culture adequate volume   Culture   Final    NO GROWTH 2 DAYS Performed at [facility], [address], [place], [place] [address]    Report Status PENDING  Incomplete  Culture, blood (Routine X 2) w Reflex to ID Panel     Status: None (Preliminary result)   Collection Time: 11/12/18  5:39 PM  Result Value Ref Range Status   Specimen Description BLOOD LEFT ARM  Final   Special Requests   Final    BOTTLES DRAWN AEROBIC AND ANAEROBIC Blood Culture adequate volume   Culture   Final    NO GROWTH 2 DAYS Performed at [facility], [address] [place], [place] [address]    Report Status PENDING  Incomplete     Labs: Basic Metabolic Panel: Recent Labs  Lab 11/12/18 1109 11/13/18 0642  NA 133* 137  K 3.7 3.5  CL 101 106  CO2 23 22  GLUCOSE 128* 104*  BUN 29* 25*  CREATININE 1.36* 1.00  CALCIUM 8.7* 8.3*   CBC: Recent Labs  Lab 11/12/18 1109 11/13/18 0642  WBC 12.1* 10.6*  HGB 11.9* 12.2  HCT 37.0 38.2  MCV 89.2 90.7  PLT 486* 505*   Cardiac Enzymes: Recent Labs  Lab 11/12/18 1109  TROPONINI <0.03   Signed:  Barton Dubois MD.  [facility] 11/14/2018, 2:39 PM

## 2018-11-14 NOTE — Progress Notes (Signed)
IV removed and discharge instructions reviewed.  Scripts given and to see her Dr. Nevada Crane tomorrow to check INR.  Gave 5mg  coumadin dose before leaving and explained that to son. To have home health which is arranged.  Transported by WC to car. Son to drive home

## 2018-11-15 DIAGNOSIS — R600 Localized edema: Secondary | ICD-10-CM | POA: Diagnosis not present

## 2018-11-15 DIAGNOSIS — J189 Pneumonia, unspecified organism: Secondary | ICD-10-CM | POA: Diagnosis not present

## 2018-11-15 DIAGNOSIS — I4891 Unspecified atrial fibrillation: Secondary | ICD-10-CM | POA: Diagnosis not present

## 2018-11-15 DIAGNOSIS — N183 Chronic kidney disease, stage 3 (moderate): Secondary | ICD-10-CM | POA: Diagnosis not present

## 2018-11-16 DIAGNOSIS — J189 Pneumonia, unspecified organism: Secondary | ICD-10-CM | POA: Diagnosis not present

## 2018-11-16 DIAGNOSIS — R1310 Dysphagia, unspecified: Secondary | ICD-10-CM | POA: Diagnosis not present

## 2018-11-16 DIAGNOSIS — E7800 Pure hypercholesterolemia, unspecified: Secondary | ICD-10-CM | POA: Diagnosis not present

## 2018-11-16 DIAGNOSIS — I272 Pulmonary hypertension, unspecified: Secondary | ICD-10-CM | POA: Diagnosis not present

## 2018-11-16 DIAGNOSIS — E039 Hypothyroidism, unspecified: Secondary | ICD-10-CM | POA: Diagnosis not present

## 2018-11-16 DIAGNOSIS — S22088D Other fracture of T11-T12 vertebra, subsequent encounter for fracture with routine healing: Secondary | ICD-10-CM | POA: Diagnosis not present

## 2018-11-16 DIAGNOSIS — I129 Hypertensive chronic kidney disease with stage 1 through stage 4 chronic kidney disease, or unspecified chronic kidney disease: Secondary | ICD-10-CM | POA: Diagnosis not present

## 2018-11-16 DIAGNOSIS — I4891 Unspecified atrial fibrillation: Secondary | ICD-10-CM | POA: Diagnosis not present

## 2018-11-16 DIAGNOSIS — F419 Anxiety disorder, unspecified: Secondary | ICD-10-CM | POA: Diagnosis not present

## 2018-11-16 DIAGNOSIS — N189 Chronic kidney disease, unspecified: Secondary | ICD-10-CM | POA: Diagnosis not present

## 2018-11-16 DIAGNOSIS — M1990 Unspecified osteoarthritis, unspecified site: Secondary | ICD-10-CM | POA: Diagnosis not present

## 2018-11-16 DIAGNOSIS — F0280 Dementia in other diseases classified elsewhere without behavioral disturbance: Secondary | ICD-10-CM | POA: Diagnosis not present

## 2018-11-16 DIAGNOSIS — F329 Major depressive disorder, single episode, unspecified: Secondary | ICD-10-CM | POA: Diagnosis not present

## 2018-11-17 LAB — CULTURE, BLOOD (ROUTINE X 2)
Culture: NO GROWTH
Culture: NO GROWTH
Special Requests: ADEQUATE
Special Requests: ADEQUATE

## 2018-11-25 DIAGNOSIS — I4891 Unspecified atrial fibrillation: Secondary | ICD-10-CM | POA: Diagnosis not present

## 2018-11-30 DIAGNOSIS — R531 Weakness: Secondary | ICD-10-CM | POA: Diagnosis not present

## 2018-11-30 DIAGNOSIS — R2689 Other abnormalities of gait and mobility: Secondary | ICD-10-CM | POA: Diagnosis not present

## 2018-11-30 DIAGNOSIS — N183 Chronic kidney disease, stage 3 (moderate): Secondary | ICD-10-CM | POA: Diagnosis not present

## 2018-12-23 DIAGNOSIS — I4891 Unspecified atrial fibrillation: Secondary | ICD-10-CM | POA: Diagnosis not present

## 2018-12-23 DIAGNOSIS — Z7901 Long term (current) use of anticoagulants: Secondary | ICD-10-CM | POA: Diagnosis not present

## 2018-12-23 DIAGNOSIS — M545 Low back pain: Secondary | ICD-10-CM | POA: Diagnosis not present

## 2018-12-31 DIAGNOSIS — R2689 Other abnormalities of gait and mobility: Secondary | ICD-10-CM | POA: Diagnosis not present

## 2018-12-31 DIAGNOSIS — R531 Weakness: Secondary | ICD-10-CM | POA: Diagnosis not present

## 2018-12-31 DIAGNOSIS — N183 Chronic kidney disease, stage 3 (moderate): Secondary | ICD-10-CM | POA: Diagnosis not present

## 2019-01-06 DIAGNOSIS — I482 Chronic atrial fibrillation, unspecified: Secondary | ICD-10-CM | POA: Diagnosis not present

## 2019-01-06 DIAGNOSIS — L299 Pruritus, unspecified: Secondary | ICD-10-CM | POA: Diagnosis not present

## 2019-01-06 DIAGNOSIS — M545 Low back pain: Secondary | ICD-10-CM | POA: Diagnosis not present

## 2019-01-28 DIAGNOSIS — I4891 Unspecified atrial fibrillation: Secondary | ICD-10-CM | POA: Diagnosis not present

## 2019-01-28 DIAGNOSIS — M545 Low back pain: Secondary | ICD-10-CM | POA: Diagnosis not present

## 2019-01-28 DIAGNOSIS — L853 Xerosis cutis: Secondary | ICD-10-CM | POA: Diagnosis not present

## 2019-01-31 DIAGNOSIS — R2689 Other abnormalities of gait and mobility: Secondary | ICD-10-CM | POA: Diagnosis not present

## 2019-01-31 DIAGNOSIS — R531 Weakness: Secondary | ICD-10-CM | POA: Diagnosis not present

## 2019-01-31 DIAGNOSIS — N183 Chronic kidney disease, stage 3 (moderate): Secondary | ICD-10-CM | POA: Diagnosis not present

## 2019-02-24 DIAGNOSIS — M545 Low back pain: Secondary | ICD-10-CM | POA: Diagnosis not present

## 2019-02-24 DIAGNOSIS — H6123 Impacted cerumen, bilateral: Secondary | ICD-10-CM | POA: Diagnosis not present

## 2019-02-24 DIAGNOSIS — L853 Xerosis cutis: Secondary | ICD-10-CM | POA: Diagnosis not present

## 2019-02-24 DIAGNOSIS — I4891 Unspecified atrial fibrillation: Secondary | ICD-10-CM | POA: Diagnosis not present

## 2019-03-01 DIAGNOSIS — R531 Weakness: Secondary | ICD-10-CM | POA: Diagnosis not present

## 2019-03-01 DIAGNOSIS — R2689 Other abnormalities of gait and mobility: Secondary | ICD-10-CM | POA: Diagnosis not present

## 2019-03-01 DIAGNOSIS — N183 Chronic kidney disease, stage 3 (moderate): Secondary | ICD-10-CM | POA: Diagnosis not present

## 2019-03-24 DIAGNOSIS — Z7901 Long term (current) use of anticoagulants: Secondary | ICD-10-CM | POA: Diagnosis not present

## 2019-03-24 DIAGNOSIS — I4891 Unspecified atrial fibrillation: Secondary | ICD-10-CM | POA: Diagnosis not present

## 2019-03-24 DIAGNOSIS — M545 Low back pain: Secondary | ICD-10-CM | POA: Diagnosis not present

## 2019-03-24 DIAGNOSIS — L853 Xerosis cutis: Secondary | ICD-10-CM | POA: Diagnosis not present

## 2019-04-01 DIAGNOSIS — R531 Weakness: Secondary | ICD-10-CM | POA: Diagnosis not present

## 2019-04-01 DIAGNOSIS — N183 Chronic kidney disease, stage 3 (moderate): Secondary | ICD-10-CM | POA: Diagnosis not present

## 2019-04-01 DIAGNOSIS — R2689 Other abnormalities of gait and mobility: Secondary | ICD-10-CM | POA: Diagnosis not present

## 2019-04-08 DIAGNOSIS — Z0000 Encounter for general adult medical examination without abnormal findings: Secondary | ICD-10-CM | POA: Diagnosis not present

## 2019-04-21 DIAGNOSIS — J309 Allergic rhinitis, unspecified: Secondary | ICD-10-CM | POA: Diagnosis not present

## 2019-04-21 DIAGNOSIS — R0982 Postnasal drip: Secondary | ICD-10-CM | POA: Diagnosis not present

## 2019-04-21 DIAGNOSIS — Z7901 Long term (current) use of anticoagulants: Secondary | ICD-10-CM | POA: Diagnosis not present

## 2019-05-01 DIAGNOSIS — R531 Weakness: Secondary | ICD-10-CM | POA: Diagnosis not present

## 2019-05-01 DIAGNOSIS — R2689 Other abnormalities of gait and mobility: Secondary | ICD-10-CM | POA: Diagnosis not present

## 2019-05-01 DIAGNOSIS — N183 Chronic kidney disease, stage 3 (moderate): Secondary | ICD-10-CM | POA: Diagnosis not present

## 2019-05-19 DIAGNOSIS — Z7901 Long term (current) use of anticoagulants: Secondary | ICD-10-CM | POA: Diagnosis not present

## 2019-05-19 DIAGNOSIS — N183 Chronic kidney disease, stage 3 (moderate): Secondary | ICD-10-CM | POA: Diagnosis not present

## 2019-05-19 DIAGNOSIS — E782 Mixed hyperlipidemia: Secondary | ICD-10-CM | POA: Diagnosis not present

## 2019-05-19 DIAGNOSIS — R944 Abnormal results of kidney function studies: Secondary | ICD-10-CM | POA: Diagnosis not present

## 2019-05-24 DIAGNOSIS — N183 Chronic kidney disease, stage 3 (moderate): Secondary | ICD-10-CM | POA: Diagnosis not present

## 2019-05-24 DIAGNOSIS — D473 Essential (hemorrhagic) thrombocythemia: Secondary | ICD-10-CM | POA: Diagnosis not present

## 2019-05-24 DIAGNOSIS — M545 Low back pain: Secondary | ICD-10-CM | POA: Diagnosis not present

## 2019-05-24 DIAGNOSIS — I4891 Unspecified atrial fibrillation: Secondary | ICD-10-CM | POA: Diagnosis not present

## 2019-06-01 DIAGNOSIS — N183 Chronic kidney disease, stage 3 (moderate): Secondary | ICD-10-CM | POA: Diagnosis not present

## 2019-06-01 DIAGNOSIS — R531 Weakness: Secondary | ICD-10-CM | POA: Diagnosis not present

## 2019-06-01 DIAGNOSIS — R2689 Other abnormalities of gait and mobility: Secondary | ICD-10-CM | POA: Diagnosis not present

## 2019-06-29 DIAGNOSIS — F411 Generalized anxiety disorder: Secondary | ICD-10-CM | POA: Diagnosis not present

## 2019-06-29 DIAGNOSIS — G8929 Other chronic pain: Secondary | ICD-10-CM | POA: Diagnosis not present

## 2019-06-29 DIAGNOSIS — I4891 Unspecified atrial fibrillation: Secondary | ICD-10-CM | POA: Diagnosis not present

## 2019-06-29 DIAGNOSIS — M545 Low back pain: Secondary | ICD-10-CM | POA: Diagnosis not present

## 2019-07-01 DIAGNOSIS — R2689 Other abnormalities of gait and mobility: Secondary | ICD-10-CM | POA: Diagnosis not present

## 2019-07-01 DIAGNOSIS — N183 Chronic kidney disease, stage 3 (moderate): Secondary | ICD-10-CM | POA: Diagnosis not present

## 2019-07-01 DIAGNOSIS — R531 Weakness: Secondary | ICD-10-CM | POA: Diagnosis not present

## 2019-07-05 ENCOUNTER — Other Ambulatory Visit: Payer: Self-pay

## 2019-07-05 ENCOUNTER — Encounter (INDEPENDENT_AMBULATORY_CARE_PROVIDER_SITE_OTHER): Payer: Self-pay | Admitting: Internal Medicine

## 2019-07-05 ENCOUNTER — Ambulatory Visit (INDEPENDENT_AMBULATORY_CARE_PROVIDER_SITE_OTHER): Payer: Medicare Other | Admitting: Internal Medicine

## 2019-07-05 VITALS — BP 154/89 | HR 66 | Temp 98.0°F | Ht 64.0 in | Wt 125.1 lb

## 2019-07-05 DIAGNOSIS — K5900 Constipation, unspecified: Secondary | ICD-10-CM | POA: Diagnosis not present

## 2019-07-05 NOTE — Progress Notes (Addendum)
Subjective:    Patient ID: Leslie Rosario, female    DOB: [birth year], 85 y.o.   MRN: [id number]  HPI Here today for f/u.  Hx of chronic constipation.  Today she c/o back pain. Kyphoplasty in July of 2019. She has a BM every morning. She takes Dulcolax as needed for her constipation. Her stools are soft.  Her appetite is good. She has lost from 134 to 125. She drinks Ensure as a supplement. She eats 3 light meals a day.  She has not seen in any blood in her stools.    Past Medical History:  Diagnosis Date  . Anxiety   . Arthritis   . Atrial fibrillation ([place])   . Constipation   . Depression   . Dyspnea   . Dysrhythmia    A-Fib  . GERD (gastroesophageal reflux disease)   . Hyperlipidemia   . Hypertension   . Hypothyroidism   . Nausea & vomiting   . T12 compression fracture ([place]) 06/2017  . Urinary frequency     Past Surgical History:  Procedure Laterality Date  . COLONOSCOPY    . KYPHOPLASTY N/A 06/30/2018   Procedure: KYPHOPLASTY L1 and T12;  Surgeon: Melina Schools, MD;  Location: [place];  Service: Orthopedics;  Laterality: N/A;  90 mins  . NM MYOCAR PERF WALL MOTION  04/2006   dipyridamole; normal pattern of perfusion to all regions, post-stress EF 73%  . TONSILLECTOMY    . TRANSTHORACIC ECHOCARDIOGRAM  04/2012   EF=>55%, borderline conc LVH; LA mod dilated; RA mildly dilated; mild mitral annular calcif, mod MR; mild-mod TR with elevated RSVP 30-70mmHg, mild pulm HTN; mild calcif of AV leaflets, AV mildly sclerotic; aortic root sclerosis/calcif  . UPPER GASTROINTESTINAL ENDOSCOPY      Allergies  Allergen Reactions  . Adhesive [Tape] Other (See Comments)    REACTION: pulls skin  . Codeine     UNKNOWN REACTION  . Prednisone     UNKNOWN  . Sulfonamide Derivatives     UNKNOWN REACTION  . Vicodin [Hydrocodone-Acetaminophen]     "Intolerance "    Current Outpatient Medications on File Prior to Visit  Medication Sig Dispense Refill  . ALPRAZolam (XANAX) 0.5 MG tablet  Take 1 tablet (0.5 mg total) by mouth at bedtime as needed for anxiety or sleep.  0  . bisacodyl (DULCOLAX) 10 MG suppository Place 10 mg rectally every three (3) days as needed for moderate constipation.    . DULoxetine (CYMBALTA) 20 MG capsule Take 20 mg by mouth daily.    . feeding supplement, ENSURE ENLIVE, (ENSURE ENLIVE) LIQD Take 237 mLs by mouth 2 (two) times daily between meals.    Marland Kitchen levothyroxine (SYNTHROID, LEVOTHROID) 75 MCG tablet Take 75 mcg by mouth daily before breakfast.    . metoprolol (TOPROL-XL) 25 MG 24 hr tablet Take 12.5 mg by mouth at bedtime.     Marland Kitchen omeprazole (PRILOSEC) 20 MG capsule Take 20 mg by mouth every morning.     . simvastatin (ZOCOR) 20 MG tablet Take 20 mg by mouth daily. 1/2 tab a dahy    . warfarin (COUMADIN) 3 MG tablet Take 3 mg by mouth daily. Patient states that she is taking 3 mg daily. On Tuesday and Thursdays, she takes 2.    . benzonatate (TESSALON) 100 MG capsule Take 1 capsule (100 mg total) by mouth 3 (three) times daily as needed for cough. (Patient not taking: Reported on 07/05/2019) 20 capsule 0  . Ipratropium-Albuterol (COMBIVENT) 20-100 MCG/ACT  AERS respimat Inhale 1 puff into the lungs every 6 (six) hours as needed for wheezing or shortness of breath. (Patient not taking: Reported on 07/05/2019) 1 Inhaler 0   No current facility-administered medications on file prior to visit.         Objective:   Physical Exam Blood pressure (!) 154/89, pulse 66, temperature 98 F (36.7 C), height 5\' 4"  (1.626 m), weight 125 lb 1.6 oz (56.7 kg). Alert and oriented. Skin warm and dry. Oral mucosa is moist.   . Sclera anicteric, conjunctivae is pink. Thyroid not enlarged. No cervical lymphadenopathy. Lungs clear. Heart regular rate and rhythm.  Abdomen is soft. Bowel sounds are positive. No hepatomegaly. No abdominal masses felt. No tenderness.  No edema to lower extremities. Walks with a walker. Very unsteady on her feet.       Assessment & Plan:   Constipation. She can continue the Dulcolax as needed. Needs to follow up with Dr. Rolena Infante concerning her back pain.

## 2019-07-05 NOTE — Addendum Note (Signed)
Addended by: Butch Penny on: 07/05/2019 02:24 PM   Modules accepted: Level of Service

## 2019-07-05 NOTE — Patient Instructions (Signed)
Continue the Dulcolax as needed. OV in 1 year.

## 2019-07-28 DIAGNOSIS — M545 Low back pain: Secondary | ICD-10-CM | POA: Diagnosis not present

## 2019-07-28 DIAGNOSIS — I4891 Unspecified atrial fibrillation: Secondary | ICD-10-CM | POA: Diagnosis not present

## 2019-07-28 DIAGNOSIS — R350 Frequency of micturition: Secondary | ICD-10-CM | POA: Diagnosis not present

## 2019-08-01 DIAGNOSIS — N183 Chronic kidney disease, stage 3 (moderate): Secondary | ICD-10-CM | POA: Diagnosis not present

## 2019-08-01 DIAGNOSIS — R2689 Other abnormalities of gait and mobility: Secondary | ICD-10-CM | POA: Diagnosis not present

## 2019-08-01 DIAGNOSIS — R531 Weakness: Secondary | ICD-10-CM | POA: Diagnosis not present

## 2019-08-08 ENCOUNTER — Telehealth: Payer: Self-pay | Admitting: Cardiovascular Disease

## 2019-08-08 NOTE — Telephone Encounter (Signed)
Spoke with the pts son, Richardson Landry, and he reports that his brother, Fritz Pickerel, will be bringing the pt to the office 08/10/19 for her appt... he is okay with waiting outside if he needs to although he does not have any COVID symptoms and has been social distancing.

## 2019-08-08 NOTE — Telephone Encounter (Signed)
  Son called to say that his brother will be coming with the patient to her appt on 08/10/19 to assist her.

## 2019-08-10 ENCOUNTER — Ambulatory Visit (INDEPENDENT_AMBULATORY_CARE_PROVIDER_SITE_OTHER): Payer: Medicare Other | Admitting: Cardiovascular Disease

## 2019-08-10 ENCOUNTER — Other Ambulatory Visit: Payer: Self-pay

## 2019-08-10 ENCOUNTER — Encounter: Payer: Self-pay | Admitting: Cardiovascular Disease

## 2019-08-10 VITALS — BP 160/86 | HR 70 | Ht 63.0 in | Wt 125.4 lb

## 2019-08-10 DIAGNOSIS — I272 Pulmonary hypertension, unspecified: Secondary | ICD-10-CM | POA: Diagnosis not present

## 2019-08-10 DIAGNOSIS — Z7901 Long term (current) use of anticoagulants: Secondary | ICD-10-CM | POA: Diagnosis not present

## 2019-08-10 DIAGNOSIS — I10 Essential (primary) hypertension: Secondary | ICD-10-CM

## 2019-08-10 DIAGNOSIS — I4821 Permanent atrial fibrillation: Secondary | ICD-10-CM | POA: Diagnosis not present

## 2019-08-10 DIAGNOSIS — S22000A Wedge compression fracture of unspecified thoracic vertebra, initial encounter for closed fracture: Secondary | ICD-10-CM

## 2019-08-10 DIAGNOSIS — I340 Nonrheumatic mitral (valve) insufficiency: Secondary | ICD-10-CM

## 2019-08-10 NOTE — Progress Notes (Signed)
Patient ID: Leslie Rosario, female   DOB: Apr 22, 1923, 85 y.o.   MRN: [id number]    Primary M.D.: Dr. Wende Neighbors  HPI: Leslie Rosario is a 85 y.o. female who presents for a 70 month cardiology evaluation.    Leslie Rosario has a history of permanent atrial fibrillation and is on Coumadin anticoagulation. She has a history of hypothyroidism on thyroid replacement with Synthroid 75 mcg.There is also a history of hyperlipidemia and is on simvastatin 10 mg. She has  a history of hypertension as well as mild leg swelling.  In the past she had issues with leg edema but this has significant improved with support stockings.  [place] outside [place].  I last saw her in June 2019.  She sees Dr. Wende Neighbors who typically checks her blood work.  She has issues with chronic back discomfort and had been under evaluation for kyphoplasty.  She has permanent atrial fibrillation and is on chronic warfarin therapy.  She denies presyncope or syncope.  She underwent an echo Doppler study in September 2018 which showed an EF of 60 to 65%.  She had normal wall motion.  There was aortic valve sclerosis, mild mitral regurgitation, moderate biatrial enlargement, and peak PA systolic pressure was increased at 56 mm.    Since I last saw her, she denies any episodes of chest discomfort.  She admits to occasional episodes of shortness of breath.  Continues to experience episodic back discomfort.  She is on warfarin for anticoagulation and simvastatin for hyperlipidemia.  She has history of hypothyroidism on levothyroxine 75 mcg.  She has been on Toprol-XL 12.5 mg at bedtime.  She denies any episodes of tachycardia.  She presents for evaluation.   Past Medical History:  Diagnosis Date  . Anxiety   . Arthritis   . Atrial fibrillation ([place])   . Constipation   . Depression   . Dyspnea   . Dysrhythmia    A-Fib  . GERD (gastroesophageal reflux disease)   . Hyperlipidemia   . Hypertension   . Hypothyroidism    . Nausea & vomiting   . T12 compression fracture ([place]) 06/2017  . Urinary frequency     Past Surgical History:  Procedure Laterality Date  . COLONOSCOPY    . KYPHOPLASTY N/A 06/30/2018   Procedure: KYPHOPLASTY L1 and T12;  Surgeon: Melina Schools, MD;  Location: [facility];  Service: Orthopedics;  Laterality: N/A;  90 mins  . NM MYOCAR PERF WALL MOTION  04/2006   dipyridamole; normal pattern of perfusion to all regions, post-stress EF 73%  . TONSILLECTOMY    . TRANSTHORACIC ECHOCARDIOGRAM  04/2012   EF=>55%, borderline conc LVH; LA mod dilated; RA mildly dilated; mild mitral annular calcif, mod MR; mild-mod TR with elevated RSVP 30-63mHg, mild pulm HTN; mild calcif of AV leaflets, AV mildly sclerotic; aortic root sclerosis/calcif  . UPPER GASTROINTESTINAL ENDOSCOPY      Allergies  Allergen Reactions  . Adhesive [Tape] Other (See Comments)    REACTION: pulls skin  . Codeine     UNKNOWN REACTION  . Prednisone     UNKNOWN  . Sulfonamide Derivatives     UNKNOWN REACTION  . Vicodin [Hydrocodone-Acetaminophen]     "Intolerance "    Current Outpatient Medications  Medication Sig Dispense Refill  . ALPRAZolam (XANAX) 0.5 MG tablet Take 1 tablet (0.5 mg total) by mouth at bedtime as needed for anxiety or sleep.  0  . benzonatate (TESSALON) 100 MG  capsule Take 1 capsule (100 mg total) by mouth 3 (three) times daily as needed for cough. 20 capsule 0  . bisacodyl (DULCOLAX) 10 MG suppository Place 10 mg rectally every three (3) days as needed for moderate constipation.    . DULoxetine (CYMBALTA) 20 MG capsule Take 20 mg by mouth daily.    . feeding supplement, ENSURE ENLIVE, (ENSURE ENLIVE) LIQD Take 237 mLs by mouth 2 (two) times daily between meals.    . Ipratropium-Albuterol (COMBIVENT) 20-100 MCG/ACT AERS respimat Inhale 1 puff into the lungs every 6 (six) hours as needed for wheezing or shortness of breath. 1 Inhaler 0  . levothyroxine (SYNTHROID, LEVOTHROID) 75 MCG tablet Take 75 mcg by  mouth daily before breakfast.    . metoprolol (TOPROL-XL) 25 MG 24 hr tablet Take 12.5 mg by mouth at bedtime.     Marland Kitchen omeprazole (PRILOSEC) 20 MG capsule Take 20 mg by mouth every morning.     . simvastatin (ZOCOR) 20 MG tablet Take 20 mg by mouth daily. 1/2 tab a dahy    . warfarin (COUMADIN) 3 MG tablet Take 3 mg by mouth daily. Patient states that she is taking 3 mg daily. On Tuesday and Thursdays, she takes 2.     Rosario current facility-administered medications for this visit.     Social History   Socioeconomic History  . Marital status: Widowed    Spouse name: Not on file  . Number of children: 3  . Years of education: Not on file  . Highest education level: Not on file  Occupational History    Employer: RETIRED  Social Needs  . Financial resource strain: Not on file  . Food insecurity    Worry: Not on file    Inability: Not on file  . Transportation needs    Medical: Not on file    Non-medical: Not on file  Tobacco Use  . Smoking status: Former Smoker    Quit date: 01/10/1985    Years since quitting: 34.6  . Smokeless tobacco: Never Used  . Tobacco comment: never inhaled  Substance and Sexual Activity  . Alcohol use: Rosario    Alcohol/week: 0.0 standard drinks  . Drug use: Rosario  . Sexual activity: Not on file  Lifestyle  . Physical activity    Days per week: Not on file    Minutes per session: Not on file  . Stress: Not on file  Relationships  . Social Herbalist on phone: Not on file    Gets together: Not on file    Attends religious service: Not on file    Active member of club or organization: Not on file    Attends meetings of clubs or organizations: Not on file    Relationship status: Not on file  . Intimate partner violence    Fear of current or ex partner: Not on file    Emotionally abused: Not on file    Physically abused: Not on file    Forced sexual activity: Not on file  Other Topics Concern  . Not on file  Social History Narrative  . Not on  file   Social history is notable that she is widowed and has 3 children. Since her husband passed away her son was with her. There is remote tobacco history but she quit in 1986. She remains active.  Family History  Problem Relation Age of Onset  . Cancer Mother   . Ovarian cancer Mother   . Cancer  Father   . Cancer Brother   . Emphysema Brother   . Emphysema Brother   . Pneumonia Brother   . Aneurysm Brother   . Healthy Son   . Sleep apnea Son   . Aortic aneurysm Other   . Kidney cancer Son     ROS General: Negative; Rosario fevers, chills, or night sweats;  HEENT: Negative; Rosario changes in vision or hearing, sinus congestion, difficulty swallowing Pulmonary: Negative; Rosario cough, wheezing, shortness of breath, hemoptysis Cardiovascular: Negative; Rosario chest pain, presyncope, syncope, palpitations Resolution of prior edema GI: Negative; Rosario nausea, vomiting, diarrhea, or abdominal pain GU: Negative; Rosario dysuria, hematuria, or difficulty voiding Musculoskeletal: back discomfort Hematologic/Oncology: Negative; Rosario easy bruising, bleeding; on Coumadin Endocrine: Positive for hypothyroidism Neuro: Negative; Rosario changes in balance, headaches Skin: Negative; Rosario rashes or skin lesions Psychiatric: Negative; Rosario behavioral problems, depression Sleep: Negative; Rosario snoring, daytime sleepiness, hypersomnolence, bruxism, restless legs, hypnogognic hallucinations, Rosario cataplexy Other comprehensive 14 point system review is negative.   PE BP (!) 160/86   Pulse 70   Ht _0  (1.6 m)   Wt 125 lb 6.4 oz (56.9 kg)   SpO2 96%   BMI 22.21 kg/m    Repeat blood pressure by me was 128/84  Wt Readings from Last 3 Encounters:  08/10/19 125 lb 6.4 oz (56.9 kg)  07/05/19 125 lb 1.6 oz (56.7 kg)  11/12/18 123 lb 7.3 oz (56 kg)   General: Alert, oriented, Rosario distress.  Skin: normal turgor, Rosario rashes, warm and dry HEENT: Normocephalic, atraumatic. Pupils equal round and reactive to light; sclera anicteric;  extraocular muscles intact;  Nose without nasal septal hypertrophy Mouth/Parynx benign; Mallinpatti scale 2 Neck: Rosario JVD, Rosario carotid bruits; normal carotid upstroke Lungs: clear to ausculatation and percussion; Rosario wheezing or rales Chest wall: without tenderness to palpitation Heart: PMI not displaced, irregularly irregular with a ventricular rate in the 70s consistent with her atrial fibrillation, s1 s2 normal, 1/6 systolic murmur, Rosario diastolic murmur, Rosario rubs, gallops, thrills, or heaves Abdomen: soft, nontender; Rosario hepatosplenomehaly, BS+; abdominal aorta nontender and not dilated by palpation. Back: Rosario CVA tenderness Pulses 2+ Musculoskeletal: full range of motion, normal strength, Rosario joint deformities Extremities: Rosario clubbing cyanosis or edema, Homan's sign negative  Neurologic: grossly nonfocal; Cranial nerves grossly wnl Psychologic: Normal mood and affect   ECG (independently read by me): Atrial fibrillation at 70; nonspecific T changes; normal intervals.  June 2019 ECG (independently read by me): Atrial fibrillation with ventricular rate at 70 bpm.  Nonspecific ST changes.  Normal intervals.  August 2017 ECG (independently read by me): Atrial fibrillation with ventricular rate at 60 bpm.  QTc interval normal at 436 ms.  April 2016 ECG (independently read by me): Atrial fibrillation with a rate at 53.  Rosario significant ST-T change.  February 2015 ECG (independently read by me): Atrial fibrillation with controlled ventricular response of 62 beats per minute. Rosario significant ST changes.  LABS  BMP Latest Ref Rng & Units 11/13/2018 11/12/2018 08/24/2018  Glucose 70 - 99 mg/dL 104(H) 128(H) 90  BUN 8 - 23 mg/dL 25(H) 29(H) 19  Creatinine 0.44 - 1.00 mg/dL 1.00 1.36(H) 1.21(H)  BUN/Creat Ratio 12 - 28 - - 16  Sodium 135 - 145 mmol/L 137 133(L) 140  Potassium 3.5 - 5.1 mmol/L 3.5 3.7 4.7  Chloride 98 - 111 mmol/L 106 101 102  CO2 22 - 32 mmol/L _1 Calcium 8.9 - 10.3 mg/dL  8.3(L) 8.7(L) 9.6  Hepatic Function Latest Ref Rng & Units 09/12/2017 07/19/2012 12/29/2011  Total Protein 6.5 - 8.1 g/dL 7.4 5.6(L) 7.6  Albumin 3.5 - 5.0 g/dL 3.9 2.6(L) 4.0  AST 15 - 41 U/L 17 62(H) 24  ALT 14 - 54 U/L 12(L) 39(H) 18  Alk Phosphatase 38 - 126 U/L 104 93 89  Total Bilirubin 0.3 - 1.2 mg/dL 0.6 1.0 0.4  Bilirubin, Direct 0.0 - 0.3 mg/dL - - <0.1    CBC Latest Ref Rng & Units 11/13/2018 11/12/2018 08/24/2018  WBC 4.0 - 10.5 K/uL 10.6(H) 12.1(H) 5.5  Hemoglobin 12.0 - 15.0 g/dL 12.2 11.9(L) 13.2  Hematocrit 36.0 - 46.0 % 38.2 37.0 41.7  Platelets 150 - 400 K/uL 505(H) 486(H) 588(H)   Lab Results  Component Value Date   MCV 90.7 11/13/2018   MCV 89.2 11/12/2018   MCV 92 08/24/2018    Lab Results  Component Value Date   TSH 1.401 11/13/2018    BNP Rosario results found for: PROBNP  Lipid Panel     Component Value Date/Time   CHOL 167 09/13/2017 0533     RADIOLOGY: Rosario results found.  IMPRESSION: 1. Essential hypertension   2. Permanent atrial fibrillation   3. Long term current use of anticoagulant therapy   4. Mild pulmonary hypertension ([employer])   5. Moderate mitral regurgitation by prior echocardiogram   6. Compression fracture of body of thoracic vertebra [facility])     ASSESSMENT AND PLAN: Leslie Rosario is a 85 year old female who appears younger than her stated age. She has a history of permanent atrial fibrillation.  When I saw her in 2017 I had reduced her Toprol dose.  Her atrial fibrillation rate is well controlled on her current dose of Toprol-XL 12.5 mg with ventricular rate averaging 70 bpm.  She is on warfarin anticoagulation and INR is followed by Dr. Wende Neighbors.  Her blood pressure was initially elevated upon presentation today but on repeat by me was excellent at 128/84.  She admits to some occasional shortness of breath.  She denies any anginal type symptoms.  She continues to be on levothyroxine for hypothyroidism.  Her GERD is controlled with  omeprazole.  She is on simvastatin for hyperlipidemia.  I again reviewed her echo Doppler study from September 2018 which showed an EF of 60 to 65%, aortic valve sclerosis without stenosis, mild MR, biatrial enlargement and moderate pulmonary hypertension.  She has had compression fractures of her thoracic spine.  Time spent 25 minutes  Troy Sine, MD, [facility]  08/15/2019 6:53 PM

## 2019-08-10 NOTE — Patient Instructions (Signed)

## 2019-08-15 ENCOUNTER — Encounter: Payer: Self-pay | Admitting: Cardiovascular Disease

## 2019-08-16 DIAGNOSIS — I4891 Unspecified atrial fibrillation: Secondary | ICD-10-CM | POA: Diagnosis not present

## 2019-08-16 DIAGNOSIS — M545 Low back pain: Secondary | ICD-10-CM | POA: Diagnosis not present

## 2019-08-16 DIAGNOSIS — N183 Chronic kidney disease, stage 3 (moderate): Secondary | ICD-10-CM | POA: Diagnosis not present

## 2019-08-16 DIAGNOSIS — D473 Essential (hemorrhagic) thrombocythemia: Secondary | ICD-10-CM | POA: Diagnosis not present

## 2019-08-25 DIAGNOSIS — M545 Low back pain: Secondary | ICD-10-CM | POA: Diagnosis not present

## 2019-08-25 DIAGNOSIS — G4762 Sleep related leg cramps: Secondary | ICD-10-CM | POA: Diagnosis not present

## 2019-08-25 DIAGNOSIS — I4891 Unspecified atrial fibrillation: Secondary | ICD-10-CM | POA: Diagnosis not present

## 2019-08-25 DIAGNOSIS — R109 Unspecified abdominal pain: Secondary | ICD-10-CM | POA: Diagnosis not present

## 2019-09-01 DIAGNOSIS — R2689 Other abnormalities of gait and mobility: Secondary | ICD-10-CM | POA: Diagnosis not present

## 2019-09-01 DIAGNOSIS — N183 Chronic kidney disease, stage 3 (moderate): Secondary | ICD-10-CM | POA: Diagnosis not present

## 2019-09-01 DIAGNOSIS — R531 Weakness: Secondary | ICD-10-CM | POA: Diagnosis not present

## 2019-09-08 DIAGNOSIS — R2689 Other abnormalities of gait and mobility: Secondary | ICD-10-CM | POA: Diagnosis not present

## 2019-09-08 DIAGNOSIS — F22 Delusional disorders: Secondary | ICD-10-CM | POA: Diagnosis not present

## 2019-09-22 DIAGNOSIS — R2689 Other abnormalities of gait and mobility: Secondary | ICD-10-CM | POA: Diagnosis not present

## 2019-09-22 DIAGNOSIS — L989 Disorder of the skin and subcutaneous tissue, unspecified: Secondary | ICD-10-CM | POA: Diagnosis not present

## 2019-09-22 DIAGNOSIS — I4891 Unspecified atrial fibrillation: Secondary | ICD-10-CM | POA: Diagnosis not present

## 2019-09-22 DIAGNOSIS — F22 Delusional disorders: Secondary | ICD-10-CM | POA: Diagnosis not present

## 2019-10-10 ENCOUNTER — Emergency Department (HOSPITAL_COMMUNITY)
Admission: EM | Admit: 2019-10-10 | Discharge: 2019-10-11 | Disposition: A | Discharge status: Home / Self Care | Payer: Medicare Other | Attending: Emergency Medicine | Admitting: Emergency Medicine

## 2019-10-10 ENCOUNTER — Encounter (HOSPITAL_COMMUNITY): Payer: Self-pay

## 2019-10-10 ENCOUNTER — Other Ambulatory Visit: Payer: Self-pay

## 2019-10-10 DIAGNOSIS — E039 Hypothyroidism, unspecified: Secondary | ICD-10-CM | POA: Insufficient documentation

## 2019-10-10 DIAGNOSIS — Z79899 Other long term (current) drug therapy: Secondary | ICD-10-CM | POA: Insufficient documentation

## 2019-10-10 DIAGNOSIS — Y939 Activity, unspecified: Secondary | ICD-10-CM | POA: Diagnosis not present

## 2019-10-10 DIAGNOSIS — I4891 Unspecified atrial fibrillation: Secondary | ICD-10-CM | POA: Insufficient documentation

## 2019-10-10 DIAGNOSIS — S3992XA Unspecified injury of lower back, initial encounter: Secondary | ICD-10-CM | POA: Diagnosis present

## 2019-10-10 DIAGNOSIS — W06XXXA Fall from bed, initial encounter: Secondary | ICD-10-CM | POA: Insufficient documentation

## 2019-10-10 DIAGNOSIS — S32010A Wedge compression fracture of first lumbar vertebra, initial encounter for closed fracture: Secondary | ICD-10-CM | POA: Diagnosis not present

## 2019-10-10 DIAGNOSIS — Z87891 Personal history of nicotine dependence: Secondary | ICD-10-CM | POA: Diagnosis not present

## 2019-10-10 DIAGNOSIS — M549 Dorsalgia, unspecified: Secondary | ICD-10-CM | POA: Diagnosis not present

## 2019-10-10 DIAGNOSIS — D473 Essential (hemorrhagic) thrombocythemia: Secondary | ICD-10-CM | POA: Insufficient documentation

## 2019-10-10 DIAGNOSIS — Z7901 Long term (current) use of anticoagulants: Secondary | ICD-10-CM

## 2019-10-10 DIAGNOSIS — Y92003 Bedroom of unspecified non-institutional (private) residence as the place of occurrence of the external cause: Secondary | ICD-10-CM | POA: Diagnosis not present

## 2019-10-10 DIAGNOSIS — S22080A Wedge compression fracture of T11-T12 vertebra, initial encounter for closed fracture: Secondary | ICD-10-CM | POA: Diagnosis not present

## 2019-10-10 DIAGNOSIS — Y999 Unspecified external cause status: Secondary | ICD-10-CM | POA: Diagnosis not present

## 2019-10-10 DIAGNOSIS — D75839 Thrombocytosis, unspecified: Secondary | ICD-10-CM

## 2019-10-10 DIAGNOSIS — S22000A Wedge compression fracture of unspecified thoracic vertebra, initial encounter for closed fracture: Secondary | ICD-10-CM | POA: Insufficient documentation

## 2019-10-10 DIAGNOSIS — I10 Essential (primary) hypertension: Secondary | ICD-10-CM | POA: Insufficient documentation

## 2019-10-10 NOTE — ED Provider Notes (Signed)
[facility] EMERGENCY DEPARTMENT Provider Note   CSN: [id number] Arrival date & time: 10/10/19  1807    History   Chief Complaint Chief Complaint  Patient presents with  . Back Pain    HPI Leslie Rosario is a 85 y.o. female.   The history is provided by the patient.  She has history of hypertension, hyperlipidemia, atrial fibrillation anticoagulated on warfarin, prior compression fracture of T12 and comes in with pain across her mid back which started this afternoon.  Pain is severe and she rates it at 8/10.  Is worse with any movement.  She denies any chest pain or abdominal pain.  She denies nausea or vomiting.  She denies weakness, numbness, or tingling.  She denies difficulty with bowels or bladder.  She has not done anything for pain.  She denies any recent trauma, but but did tell triage nurse that she rolled out of bed last week.  She has taken acetaminophen at home without relief.  Past Medical History:  Diagnosis Date  . Anxiety   . Arthritis   . Atrial fibrillation ([place])   . Constipation   . Depression   . Dyspnea   . Dysrhythmia    A-Fib  . GERD (gastroesophageal reflux disease)   . Hyperlipidemia   . Hypertension   . Hypothyroidism   . Nausea & vomiting   . T12 compression fracture ([place]) 06/2017  . Urinary frequency     Patient Active Problem List   Diagnosis Date Noted  . Community acquired pneumonia 11/12/2018  . Weakness 11/12/2018  . Pneumonia 11/12/2018  . S/P kyphoplasty 06/30/2018  . Acute encephalopathy 09/13/2017  . T12 compression fracture ([place]) 09/13/2017  . Altered mental status   . Chronic prescription benzodiazepine use   . Opiate use   . Slurred speech 09/12/2017  . Essential hypertension 07/23/2016  . Hypothyroidism 03/30/2015  . Mild pulmonary hypertension ([place]) 01/16/2014  . Moderate mitral regurgitation by prior echocardiogram 01/16/2014  . Long term current use of anticoagulant therapy 02/20/2013  . Dehydration 07/18/2012  .  UTI (urinary tract infection) 07/18/2012  . Constipation 02/17/2012  . Acute gastritis 12/29/2011  . GERD (gastroesophageal reflux disease)   . Atrial fibrillation ([place]) 09/12/2010  . POLYDIPSIA 09/12/2010  . DYSPHAGIA 08/22/2010  . ABDOMINAL PAIN, LEFT LOWER QUADRANT 03/05/2010  . HYPERCHOLESTEROLEMIA 03/04/2010  . HYPERLIPIDEMIA 03/04/2010  . Anxiety state 03/04/2010  . GERD 03/04/2010  . HIATAL HERNIA, HX OF 03/04/2010    Past Surgical History:  Procedure Laterality Date  . COLONOSCOPY    . KYPHOPLASTY N/A 06/30/2018   Procedure: KYPHOPLASTY L1 and T12;  Surgeon: Melina Schools, MD;  Location: [facility];  Service: Orthopedics;  Laterality: N/A;  90 mins  . NM MYOCAR PERF WALL MOTION  04/2006   dipyridamole; normal pattern of perfusion to all regions, post-stress EF 73%  . TONSILLECTOMY    . TRANSTHORACIC ECHOCARDIOGRAM  04/2012   EF=>55%, borderline conc LVH; LA mod dilated; RA mildly dilated; mild mitral annular calcif, mod MR; mild-mod TR with elevated RSVP 30-63mmHg, mild pulm HTN; mild calcif of AV leaflets, AV mildly sclerotic; aortic root sclerosis/calcif  . UPPER GASTROINTESTINAL ENDOSCOPY       OB History   No obstetric history on file.      Home Medications    Prior to Admission medications   Medication Sig Start Date End Date Taking? Authorizing Provider  ALPRAZolam Duanne Moron) 0.5 MG tablet Take 1 tablet (0.5 mg total) by mouth at bedtime as needed  for anxiety or sleep. 11/14/18   Barton Dubois, MD  benzonatate (TESSALON) 100 MG capsule Take 1 capsule (100 mg total) by mouth 3 (three) times daily as needed for cough. 11/14/18   Barton Dubois, MD  bisacodyl (DULCOLAX) 10 MG suppository Place 10 mg rectally every three (3) days as needed for moderate constipation.    [provider]  DULoxetine (CYMBALTA) 20 MG capsule Take 20 mg by mouth daily.    [provider]  feeding supplement, ENSURE ENLIVE, (ENSURE ENLIVE) LIQD Take 237 mLs by mouth 2 (two) times  daily between meals. 11/14/18   Barton Dubois, MD  Ipratropium-Albuterol (COMBIVENT) 20-100 MCG/ACT AERS respimat Inhale 1 puff into the lungs every 6 (six) hours as needed for wheezing or shortness of breath. 11/14/18   Barton Dubois, MD  levothyroxine (SYNTHROID, LEVOTHROID) 75 MCG tablet Take 75 mcg by mouth daily before breakfast.    [provider]  metoprolol (TOPROL-XL) 25 MG 24 hr tablet Take 12.5 mg by mouth at bedtime.     [provider]  omeprazole (PRILOSEC) 20 MG capsule Take 20 mg by mouth every morning.     [provider]  simvastatin (ZOCOR) 20 MG tablet Take 20 mg by mouth daily. 1/2 tab a dahy    [provider]  warfarin (COUMADIN) 3 MG tablet Take 3 mg by mouth daily. Patient states that she is taking 3 mg daily. On Tuesday and Thursdays, she takes 2.    [provider]    Family History Family History  Problem Relation Age of Onset  . Cancer Mother   . Ovarian cancer Mother   . Cancer Father   . Cancer Brother   . Emphysema Brother   . Emphysema Brother   . Pneumonia Brother   . Aneurysm Brother   . Healthy Son   . Sleep apnea Son   . Aortic aneurysm Other   . Kidney cancer Son     Social History Social History   Tobacco Use  . Smoking status: Former Smoker    Quit date: 01/10/1985    Years since quitting: 34.7  . Smokeless tobacco: Never Used  . Tobacco comment: never inhaled  Substance Use Topics  . Alcohol use: No    Alcohol/week: 0.0 standard drinks  . Drug use: No     Allergies   Adhesive [tape], Codeine, Prednisone, Sulfonamide derivatives, and Vicodin [hydrocodone-acetaminophen]   Review of Systems Review of Systems  All other systems reviewed and are negative.    Physical Exam Updated Vital Signs BP (!) 180/98 (BP Location: Left Arm)   Pulse 82   Temp 98.2 F (36.8 C) (Oral)   Resp 18   Ht 5\' 3"  (1.6 m)   Wt 56 kg   SpO2 97%   BMI 21.87 kg/m   Physical Exam Vitals signs and  nursing note reviewed.    85 year old female, resting comfortably and in no acute distress. Vital signs are significant for elevated blood pressure. Oxygen saturation is 97%, which is normal. Head is normocephalic and atraumatic. PERRLA, EOMI. Oropharynx is clear. Neck is nontender and supple without adenopathy or JVD. Back is moderately tender in the mid thoracic region.  There is no CVA tenderness. Lungs are clear without rales, wheezes, or rhonchi. Chest is nontender. Heart has regular rate and rhythm without murmur. Abdomen is soft, flat, nontender without masses or hepatosplenomegaly and peristalsis is normoactive. Extremities have no cyanosis or edema, full range of motion is present. Skin  is warm and dry without rash. Neurologic: Mental status is normal, cranial nerves are intact, there are no motor or sensory deficits.  ED Treatments / Results  Labs (all labs ordered are listed, but only abnormal results are displayed) Labs Reviewed  CBC WITH DIFFERENTIAL/PLATELET - Abnormal; Notable for the following components:      Result Value   WBC 10.7 (*)    Platelets 741 (*)    Neutro Abs 8.0 (*)    Abs Immature Granulocytes 0.08 (*)    All other components within normal limits  BASIC METABOLIC PANEL - Abnormal; Notable for the following components:   Glucose, Bld 118 (*)    BUN 30 (*)    GFR calc non Af Amer 49 (*)    GFR calc Af Amer 57 (*)    All other components within normal limits  PROTIME-INR - Abnormal; Notable for the following components:   Prothrombin Time 25.6 (*)    INR 2.4 (*)    All other components within normal limits  URINALYSIS, ROUTINE W REFLEX MICROSCOPIC   Radiology Dg Thoracic Spine W/swimmers  Result Date: 10/11/2019 CLINICAL DATA:  Back pain EXAM: THORACIC SPINE - 3 VIEWS COMPARISON:  07/28/2012 FINDINGS: T12 and L1 kyphoplasty are again seen. T5 compression deformity is noted of uncertain chronicity but may be acute given the patient's given clinical  history. No paraspinal mass lesion is noted. No rib abnormality is noted. IMPRESSION: T5 compression deformity of uncertain chronicity but new from 2013. Chronic T12 and L1 compression fractures with prior augmentation. Electronically Signed   By: Inez Catalina M.D.   On: 10/11/2019 01:28    Procedures Procedures   Medications Ordered in ED Medications  morphine 2 MG/ML injection 2 mg (2 mg Intravenous Given 10/11/19 0051)  ondansetron (ZOFRAN) injection 4 mg (4 mg Intravenous Given 10/11/19 0051)     Initial Impression / Assessment and Plan / ED Course  I have reviewed the triage vital signs and the nursing notes.  Pertinent labs & imaging results that were available during my care of the patient were reviewed by me and considered in my medical decision making (see chart for details).  Mid to upper back pain in patient with known history of compression fractures and well localized thoracic spinal tenderness.  Will send for thoracic spine x-rays to look for evidence of new compression fractures.  If negative, may need to consider CT angiogram to look for evidence of aortic dissection.  Old records are reviewed confirming kyphoplasty done July 2019, renal stone CT scan in 2018 showing extensive aortic atherosclerosis without aneurysm.  Labs are significant for thrombocytosis of uncertain cause.  This had been present previously, but his higher today than it had been.  Thoracic spine x-ray shows C5 compression which is consistent with where she is experiencing her pain and where she is tender.  She did get good relief of pain with a dose of intravenous morphine.  Her record on the [place] controlled substance reporting website is reviewed.  She is listed as having intolerance to hydrocodone but had received oxycodone in the past.  She is discharged with prescription for oxycodone and is referred to neurosurgery for consideration for kyphoplasty.  Patient was offered the option of staying for  social work consult for temporary placement, but she states she feels she could manage adequately at home.  Final Clinical Impressions(s) / ED Diagnoses   Final diagnoses:  Back pain  Compression fracture of thoracic vertebra, initial encounter, unspecified thoracic  vertebral level ([place])  Anticoagulated on warfarin  Thrombocytosis [facility])    ED Discharge Orders         Ordered    oxyCODONE-acetaminophen (PERCOCET) 5-325 MG tablet  Every 6 hours PRN     10/11/19 0156    oxyCODONE (ROXICODONE) 5 MG immediate release tablet  Every 6 hours PRN     10/11/19 5945           Delora Fuel, MD 85/92/92 0201

## 2019-10-10 NOTE — ED Triage Notes (Addendum)
Pt report lower back and intermittent lower abdominal pain that began this afternoon.  Back pain is worse per pt. Pt reports she rolled out of the bed last week

## 2019-10-11 ENCOUNTER — Emergency Department (HOSPITAL_COMMUNITY): Payer: Medicare Other

## 2019-10-11 DIAGNOSIS — M549 Dorsalgia, unspecified: Secondary | ICD-10-CM | POA: Diagnosis not present

## 2019-10-11 DIAGNOSIS — S22080A Wedge compression fracture of T11-T12 vertebra, initial encounter for closed fracture: Secondary | ICD-10-CM | POA: Diagnosis not present

## 2019-10-11 LAB — CBC WITH DIFFERENTIAL/PLATELET
Abs Immature Granulocytes: 0.08 10*3/uL — ABNORMAL HIGH (ref 0.00–0.07)
Basophils Absolute: 0.1 10*3/uL (ref 0.0–0.1)
Basophils Relative: 1 %
Eosinophils Absolute: 0.2 10*3/uL (ref 0.0–0.5)
Eosinophils Relative: 2 %
HCT: 45.2 % (ref 36.0–46.0)
Hemoglobin: 14.5 g/dL (ref 12.0–15.0)
Immature Granulocytes: 1 %
Lymphocytes Relative: 13 %
Lymphs Abs: 1.4 10*3/uL (ref 0.7–4.0)
MCH: 31.2 pg (ref 26.0–34.0)
MCHC: 32.1 g/dL (ref 30.0–36.0)
MCV: 97.2 fL (ref 80.0–100.0)
Monocytes Absolute: 1 10*3/uL (ref 0.1–1.0)
Monocytes Relative: 9 %
Neutro Abs: 8 10*3/uL — ABNORMAL HIGH (ref 1.7–7.7)
Neutrophils Relative %: 74 %
Platelets: 741 10*3/uL — ABNORMAL HIGH (ref 150–400)
RBC: 4.65 MIL/uL (ref 3.87–5.11)
RDW: 14.7 % (ref 11.5–15.5)
WBC: 10.7 10*3/uL — ABNORMAL HIGH (ref 4.0–10.5)
nRBC: 0 % (ref 0.0–0.2)

## 2019-10-11 LAB — BASIC METABOLIC PANEL
Anion gap: 8 (ref 5–15)
BUN: 30 mg/dL — ABNORMAL HIGH (ref 8–23)
CO2: 26 mmol/L (ref 22–32)
Calcium: 8.9 mg/dL (ref 8.9–10.3)
Chloride: 103 mmol/L (ref 98–111)
Creatinine, Ser: 0.99 mg/dL (ref 0.44–1.00)
GFR calc Af Amer: 57 mL/min — ABNORMAL LOW (ref >60)
GFR calc non Af Amer: 49 mL/min — ABNORMAL LOW (ref >60)
Glucose, Bld: 118 mg/dL — ABNORMAL HIGH (ref 70–99)
Potassium: 4.1 mmol/L (ref 3.5–5.1)
Sodium: 137 mmol/L (ref 135–145)

## 2019-10-11 LAB — PROTIME-INR
INR: 2.4 — ABNORMAL HIGH (ref 0.8–1.2)
Prothrombin Time: 25.6 seconds — ABNORMAL HIGH (ref 11.4–15.2)

## 2019-10-11 MED ORDER — OXYCODONE HCL 5 MG PO TABS: 5.0000 mg | ORAL_TABLET | Freq: Four times a day (QID) | ORAL | 0 refills | Status: DC | PRN

## 2019-10-11 MED ORDER — ONDANSETRON HCL 4 MG/2ML IJ SOLN
4.0000 mg | Freq: Once | INTRAMUSCULAR | Status: AC
  Administered 2019-10-11: 4 mg via INTRAVENOUS
  Filled 2019-10-11: qty 2

## 2019-10-11 MED ORDER — OXYCODONE-ACETAMINOPHEN 5-325 MG PO TABS: 1.0000 | ORAL_TABLET | Freq: Four times a day (QID) | ORAL | 0 refills | Status: DC | PRN

## 2019-10-11 MED ORDER — MORPHINE SULFATE (PF) 2 MG/ML IV SOLN
2.0000 mg | Freq: Once | INTRAVENOUS | Status: AC
  Administered 2019-10-11: 2 mg via INTRAVENOUS
  Filled 2019-10-11: qty 1

## 2019-10-11 NOTE — Discharge Instructions (Addendum)
Please follow up with the neurosurgeon to see if you might be a candidate to have the cement injection done again.  Return if pain is not being adequately controlled at home. Sometimes you may need to spend a few weeks at a rehab facility if you are not able to manage at home.

## 2019-10-12 DIAGNOSIS — M546 Pain in thoracic spine: Secondary | ICD-10-CM | POA: Diagnosis not present

## 2019-10-12 DIAGNOSIS — I4891 Unspecified atrial fibrillation: Secondary | ICD-10-CM | POA: Diagnosis not present

## 2019-10-12 DIAGNOSIS — M4854XA Collapsed vertebra, not elsewhere classified, thoracic region, initial encounter for fracture: Secondary | ICD-10-CM | POA: Diagnosis not present

## 2019-10-12 DIAGNOSIS — R2689 Other abnormalities of gait and mobility: Secondary | ICD-10-CM | POA: Diagnosis not present

## 2019-10-12 MED FILL — Oxycodone w/ Acetaminophen Tab 5-325 MG: ORAL | Qty: 6 | Status: AC

## 2019-10-19 DIAGNOSIS — I4891 Unspecified atrial fibrillation: Secondary | ICD-10-CM | POA: Diagnosis not present

## 2019-10-19 DIAGNOSIS — M4854XA Collapsed vertebra, not elsewhere classified, thoracic region, initial encounter for fracture: Secondary | ICD-10-CM | POA: Diagnosis not present

## 2019-10-19 DIAGNOSIS — M546 Pain in thoracic spine: Secondary | ICD-10-CM | POA: Diagnosis not present

## 2019-10-19 DIAGNOSIS — G4710 Hypersomnia, unspecified: Secondary | ICD-10-CM | POA: Diagnosis not present

## 2019-11-02 DIAGNOSIS — Z7901 Long term (current) use of anticoagulants: Secondary | ICD-10-CM | POA: Diagnosis not present

## 2019-11-02 DIAGNOSIS — R2689 Other abnormalities of gait and mobility: Secondary | ICD-10-CM | POA: Diagnosis not present

## 2019-11-02 DIAGNOSIS — G4710 Hypersomnia, unspecified: Secondary | ICD-10-CM | POA: Diagnosis not present

## 2019-11-02 DIAGNOSIS — S22030D Wedge compression fracture of third thoracic vertebra, subsequent encounter for fracture with routine healing: Secondary | ICD-10-CM | POA: Diagnosis not present

## 2019-11-02 DIAGNOSIS — M4854XA Collapsed vertebra, not elsewhere classified, thoracic region, initial encounter for fracture: Secondary | ICD-10-CM | POA: Diagnosis not present

## 2019-11-02 DIAGNOSIS — I4891 Unspecified atrial fibrillation: Secondary | ICD-10-CM | POA: Diagnosis not present

## 2019-11-02 DIAGNOSIS — I482 Chronic atrial fibrillation, unspecified: Secondary | ICD-10-CM | POA: Diagnosis not present

## 2019-11-07 DIAGNOSIS — Z7901 Long term (current) use of anticoagulants: Secondary | ICD-10-CM | POA: Diagnosis not present

## 2019-11-07 DIAGNOSIS — I4891 Unspecified atrial fibrillation: Secondary | ICD-10-CM | POA: Diagnosis not present

## 2019-11-07 DIAGNOSIS — M4854XA Collapsed vertebra, not elsewhere classified, thoracic region, initial encounter for fracture: Secondary | ICD-10-CM | POA: Diagnosis not present

## 2019-11-07 DIAGNOSIS — G4710 Hypersomnia, unspecified: Secondary | ICD-10-CM | POA: Diagnosis not present

## 2019-11-08 DIAGNOSIS — G4710 Hypersomnia, unspecified: Secondary | ICD-10-CM | POA: Diagnosis not present

## 2019-11-08 DIAGNOSIS — G894 Chronic pain syndrome: Secondary | ICD-10-CM | POA: Diagnosis not present

## 2019-11-08 DIAGNOSIS — I482 Chronic atrial fibrillation, unspecified: Secondary | ICD-10-CM | POA: Diagnosis not present

## 2019-11-08 DIAGNOSIS — M4854XD Collapsed vertebra, not elsewhere classified, thoracic region, subsequent encounter for fracture with routine healing: Secondary | ICD-10-CM | POA: Diagnosis not present

## 2019-11-16 DIAGNOSIS — Z23 Encounter for immunization: Secondary | ICD-10-CM | POA: Diagnosis not present

## 2019-11-16 DIAGNOSIS — R791 Abnormal coagulation profile: Secondary | ICD-10-CM | POA: Diagnosis not present

## 2019-11-16 DIAGNOSIS — M4854XA Collapsed vertebra, not elsewhere classified, thoracic region, initial encounter for fracture: Secondary | ICD-10-CM | POA: Diagnosis not present

## 2019-11-16 DIAGNOSIS — I4891 Unspecified atrial fibrillation: Secondary | ICD-10-CM | POA: Diagnosis not present

## 2019-11-26 IMAGING — RF DG C-ARM 61-120 MIN
1 series · 2 of 2 positions shown · non-contrast
Comparison: None available.

CLINICAL DATA: Fluoroscopic localization for kyphoplasty at L1 and
T12.

EXAM:
LUMBAR SPINE - 2-3 VIEW; DG C-ARM 61-120 MIN

[Series 1: run · 2 of 2 slices shown]
[im 1/2]
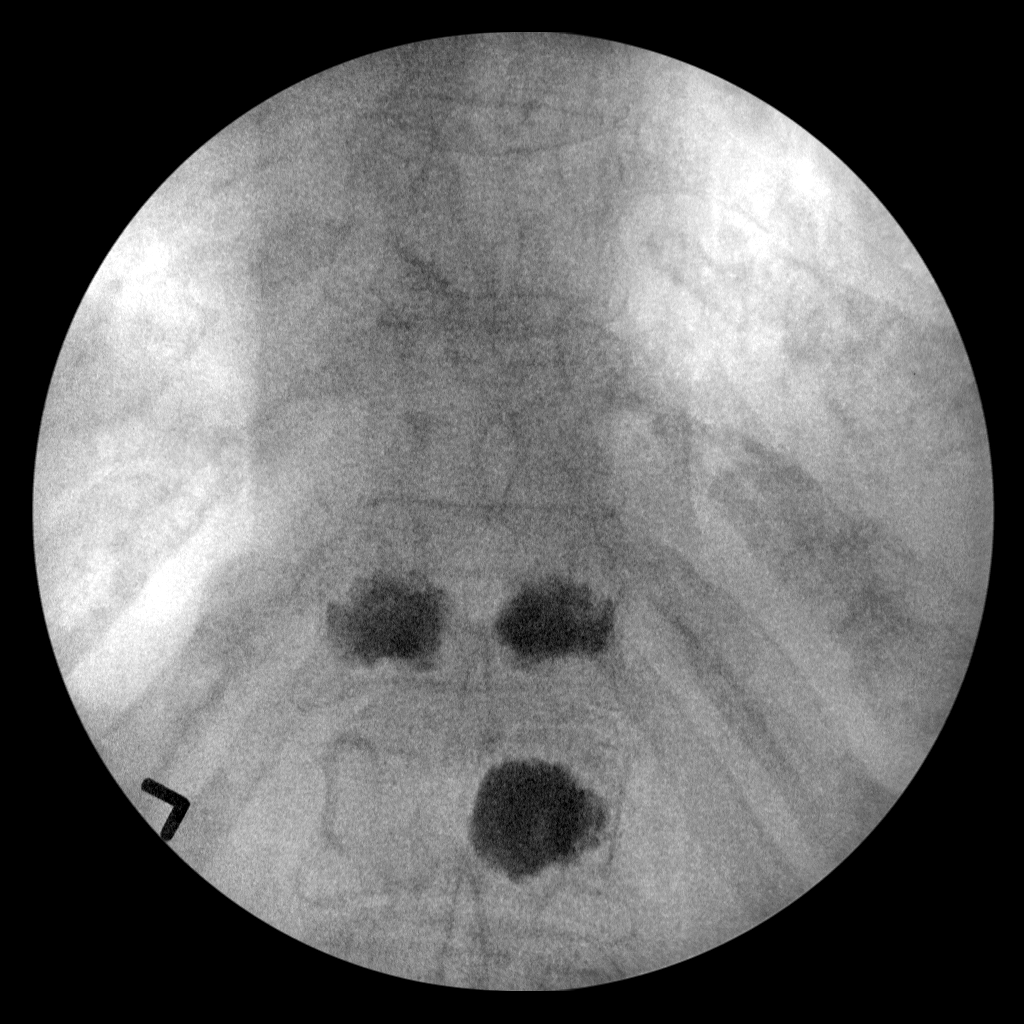
[im 2/2]
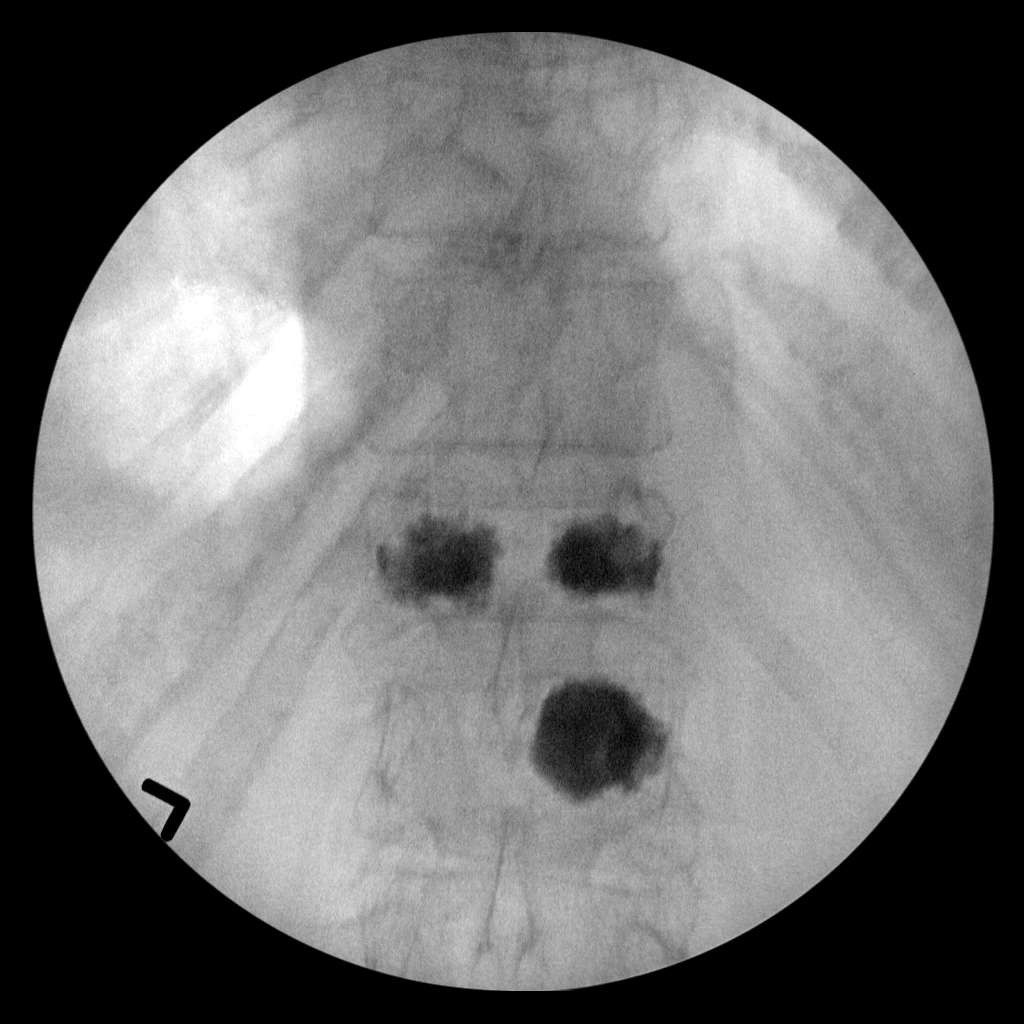

[2 of 2 positions shown; findings below may reference images not displayed]

FINDINGS: PA and lateral views of the thoracolumbar spine for localization
purposes of kyphoplasty at T12 and L1. Cement appears well
positioned within the bony confines of the T12 and L1 vertebral
bodies. No adverse features. Focal kyphotic angulation of the
thoracolumbar junction noted. No listhesis or malalignment.
IMPRESSION: Fluoroscopic localization for kyphoplasty at T12 and L1. No adverse
features.

## 2019-12-06 DIAGNOSIS — Z03818 Encounter for observation for suspected exposure to other biological agents ruled out: Secondary | ICD-10-CM | POA: Diagnosis not present

## 2019-12-13 DIAGNOSIS — Z20828 Contact with and (suspected) exposure to other viral communicable diseases: Secondary | ICD-10-CM | POA: Diagnosis not present

## 2019-12-22 DIAGNOSIS — I272 Pulmonary hypertension, unspecified: Secondary | ICD-10-CM | POA: Diagnosis not present

## 2019-12-22 DIAGNOSIS — E039 Hypothyroidism, unspecified: Secondary | ICD-10-CM | POA: Diagnosis not present

## 2019-12-22 DIAGNOSIS — I051 Rheumatic mitral insufficiency: Secondary | ICD-10-CM | POA: Diagnosis not present

## 2019-12-22 DIAGNOSIS — F329 Major depressive disorder, single episode, unspecified: Secondary | ICD-10-CM | POA: Diagnosis not present

## 2019-12-22 DIAGNOSIS — M1990 Unspecified osteoarthritis, unspecified site: Secondary | ICD-10-CM | POA: Diagnosis not present

## 2019-12-22 DIAGNOSIS — F419 Anxiety disorder, unspecified: Secondary | ICD-10-CM | POA: Diagnosis not present

## 2019-12-22 DIAGNOSIS — I482 Chronic atrial fibrillation, unspecified: Secondary | ICD-10-CM | POA: Diagnosis not present

## 2019-12-22 DIAGNOSIS — Z87891 Personal history of nicotine dependence: Secondary | ICD-10-CM | POA: Diagnosis not present

## 2019-12-22 DIAGNOSIS — N183 Chronic kidney disease, stage 3 unspecified: Secondary | ICD-10-CM | POA: Diagnosis not present

## 2019-12-22 DIAGNOSIS — I129 Hypertensive chronic kidney disease with stage 1 through stage 4 chronic kidney disease, or unspecified chronic kidney disease: Secondary | ICD-10-CM | POA: Diagnosis not present

## 2019-12-22 DIAGNOSIS — G894 Chronic pain syndrome: Secondary | ICD-10-CM | POA: Diagnosis not present

## 2019-12-22 DIAGNOSIS — N3281 Overactive bladder: Secondary | ICD-10-CM | POA: Diagnosis not present

## 2019-12-22 DIAGNOSIS — M4854XD Collapsed vertebra, not elsewhere classified, thoracic region, subsequent encounter for fracture with routine healing: Secondary | ICD-10-CM | POA: Diagnosis not present

## 2019-12-22 DIAGNOSIS — K219 Gastro-esophageal reflux disease without esophagitis: Secondary | ICD-10-CM | POA: Diagnosis not present

## 2019-12-22 DIAGNOSIS — E782 Mixed hyperlipidemia: Secondary | ICD-10-CM | POA: Diagnosis not present

## 2019-12-22 DIAGNOSIS — G3184 Mild cognitive impairment, so stated: Secondary | ICD-10-CM | POA: Diagnosis not present

## 2020-01-11 DIAGNOSIS — Z03818 Encounter for observation for suspected exposure to other biological agents ruled out: Secondary | ICD-10-CM | POA: Diagnosis not present

## 2020-01-18 DIAGNOSIS — Z03818 Encounter for observation for suspected exposure to other biological agents ruled out: Secondary | ICD-10-CM | POA: Diagnosis not present

## 2020-01-21 DIAGNOSIS — F329 Major depressive disorder, single episode, unspecified: Secondary | ICD-10-CM | POA: Diagnosis not present

## 2020-01-21 DIAGNOSIS — I129 Hypertensive chronic kidney disease with stage 1 through stage 4 chronic kidney disease, or unspecified chronic kidney disease: Secondary | ICD-10-CM | POA: Diagnosis not present

## 2020-01-21 DIAGNOSIS — I272 Pulmonary hypertension, unspecified: Secondary | ICD-10-CM | POA: Diagnosis not present

## 2020-01-21 DIAGNOSIS — I482 Chronic atrial fibrillation, unspecified: Secondary | ICD-10-CM | POA: Diagnosis not present

## 2020-01-21 DIAGNOSIS — E039 Hypothyroidism, unspecified: Secondary | ICD-10-CM | POA: Diagnosis not present

## 2020-01-21 DIAGNOSIS — G3184 Mild cognitive impairment, so stated: Secondary | ICD-10-CM | POA: Diagnosis not present

## 2020-01-21 DIAGNOSIS — E782 Mixed hyperlipidemia: Secondary | ICD-10-CM | POA: Diagnosis not present

## 2020-01-21 DIAGNOSIS — N183 Chronic kidney disease, stage 3 unspecified: Secondary | ICD-10-CM | POA: Diagnosis not present

## 2020-01-21 DIAGNOSIS — K219 Gastro-esophageal reflux disease without esophagitis: Secondary | ICD-10-CM | POA: Diagnosis not present

## 2020-01-21 DIAGNOSIS — F419 Anxiety disorder, unspecified: Secondary | ICD-10-CM | POA: Diagnosis not present

## 2020-01-21 DIAGNOSIS — I051 Rheumatic mitral insufficiency: Secondary | ICD-10-CM | POA: Diagnosis not present

## 2020-01-21 DIAGNOSIS — M4854XD Collapsed vertebra, not elsewhere classified, thoracic region, subsequent encounter for fracture with routine healing: Secondary | ICD-10-CM | POA: Diagnosis not present

## 2020-01-21 DIAGNOSIS — M1990 Unspecified osteoarthritis, unspecified site: Secondary | ICD-10-CM | POA: Diagnosis not present

## 2020-01-21 DIAGNOSIS — G894 Chronic pain syndrome: Secondary | ICD-10-CM | POA: Diagnosis not present

## 2020-01-21 DIAGNOSIS — N3281 Overactive bladder: Secondary | ICD-10-CM | POA: Diagnosis not present

## 2020-01-21 DIAGNOSIS — Z87891 Personal history of nicotine dependence: Secondary | ICD-10-CM | POA: Diagnosis not present

## 2020-01-23 DIAGNOSIS — Z03818 Encounter for observation for suspected exposure to other biological agents ruled out: Secondary | ICD-10-CM | POA: Diagnosis not present

## 2020-01-25 DIAGNOSIS — Z03818 Encounter for observation for suspected exposure to other biological agents ruled out: Secondary | ICD-10-CM | POA: Diagnosis not present

## 2020-01-30 DIAGNOSIS — Z03818 Encounter for observation for suspected exposure to other biological agents ruled out: Secondary | ICD-10-CM | POA: Diagnosis not present

## 2020-02-01 DIAGNOSIS — Z03818 Encounter for observation for suspected exposure to other biological agents ruled out: Secondary | ICD-10-CM | POA: Diagnosis not present

## 2020-02-06 DIAGNOSIS — Z03818 Encounter for observation for suspected exposure to other biological agents ruled out: Secondary | ICD-10-CM | POA: Diagnosis not present

## 2020-02-08 DIAGNOSIS — L600 Ingrowing nail: Secondary | ICD-10-CM | POA: Diagnosis not present

## 2020-02-08 DIAGNOSIS — M79674 Pain in right toe(s): Secondary | ICD-10-CM | POA: Diagnosis not present

## 2020-02-08 DIAGNOSIS — Z03818 Encounter for observation for suspected exposure to other biological agents ruled out: Secondary | ICD-10-CM | POA: Diagnosis not present

## 2020-02-08 DIAGNOSIS — M79675 Pain in left toe(s): Secondary | ICD-10-CM | POA: Diagnosis not present

## 2020-02-14 DIAGNOSIS — I482 Chronic atrial fibrillation, unspecified: Secondary | ICD-10-CM | POA: Diagnosis not present

## 2020-02-14 DIAGNOSIS — S22030D Wedge compression fracture of third thoracic vertebra, subsequent encounter for fracture with routine healing: Secondary | ICD-10-CM | POA: Diagnosis not present

## 2020-02-14 DIAGNOSIS — R2689 Other abnormalities of gait and mobility: Secondary | ICD-10-CM | POA: Diagnosis not present

## 2020-02-14 DIAGNOSIS — Z7901 Long term (current) use of anticoagulants: Secondary | ICD-10-CM | POA: Diagnosis not present

## 2020-02-23 DIAGNOSIS — I4891 Unspecified atrial fibrillation: Secondary | ICD-10-CM | POA: Diagnosis not present

## 2020-02-23 DIAGNOSIS — M4854XA Collapsed vertebra, not elsewhere classified, thoracic region, initial encounter for fracture: Secondary | ICD-10-CM | POA: Diagnosis not present

## 2020-02-23 DIAGNOSIS — R1084 Generalized abdominal pain: Secondary | ICD-10-CM | POA: Diagnosis not present

## 2020-02-23 DIAGNOSIS — R791 Abnormal coagulation profile: Secondary | ICD-10-CM | POA: Diagnosis not present

## 2020-03-08 DIAGNOSIS — Z7901 Long term (current) use of anticoagulants: Secondary | ICD-10-CM | POA: Diagnosis not present

## 2020-03-08 DIAGNOSIS — I4891 Unspecified atrial fibrillation: Secondary | ICD-10-CM | POA: Diagnosis not present

## 2020-03-22 DIAGNOSIS — Z7901 Long term (current) use of anticoagulants: Secondary | ICD-10-CM | POA: Diagnosis not present

## 2020-03-22 DIAGNOSIS — I4891 Unspecified atrial fibrillation: Secondary | ICD-10-CM | POA: Diagnosis not present

## 2020-04-12 DIAGNOSIS — R2689 Other abnormalities of gait and mobility: Secondary | ICD-10-CM | POA: Diagnosis not present

## 2020-04-12 DIAGNOSIS — M6281 Muscle weakness (generalized): Secondary | ICD-10-CM | POA: Diagnosis not present

## 2020-04-12 DIAGNOSIS — Z7901 Long term (current) use of anticoagulants: Secondary | ICD-10-CM | POA: Diagnosis not present

## 2020-04-12 DIAGNOSIS — I4891 Unspecified atrial fibrillation: Secondary | ICD-10-CM | POA: Diagnosis not present

## 2020-04-13 DIAGNOSIS — K219 Gastro-esophageal reflux disease without esophagitis: Secondary | ICD-10-CM | POA: Diagnosis not present

## 2020-04-13 DIAGNOSIS — F329 Major depressive disorder, single episode, unspecified: Secondary | ICD-10-CM | POA: Diagnosis not present

## 2020-04-13 DIAGNOSIS — E785 Hyperlipidemia, unspecified: Secondary | ICD-10-CM | POA: Diagnosis not present

## 2020-04-13 DIAGNOSIS — Z79891 Long term (current) use of opiate analgesic: Secondary | ICD-10-CM | POA: Diagnosis not present

## 2020-04-13 DIAGNOSIS — I080 Rheumatic disorders of both mitral and aortic valves: Secondary | ICD-10-CM | POA: Diagnosis not present

## 2020-04-13 DIAGNOSIS — E039 Hypothyroidism, unspecified: Secondary | ICD-10-CM | POA: Diagnosis not present

## 2020-04-13 DIAGNOSIS — F419 Anxiety disorder, unspecified: Secondary | ICD-10-CM | POA: Diagnosis not present

## 2020-04-13 DIAGNOSIS — I4821 Permanent atrial fibrillation: Secondary | ICD-10-CM | POA: Diagnosis not present

## 2020-04-13 DIAGNOSIS — N183 Chronic kidney disease, stage 3 unspecified: Secondary | ICD-10-CM | POA: Diagnosis not present

## 2020-04-13 DIAGNOSIS — Z7901 Long term (current) use of anticoagulants: Secondary | ICD-10-CM | POA: Diagnosis not present

## 2020-04-13 DIAGNOSIS — I272 Pulmonary hypertension, unspecified: Secondary | ICD-10-CM | POA: Diagnosis not present

## 2020-04-13 DIAGNOSIS — G3184 Mild cognitive impairment, so stated: Secondary | ICD-10-CM | POA: Diagnosis not present

## 2020-04-13 DIAGNOSIS — I129 Hypertensive chronic kidney disease with stage 1 through stage 4 chronic kidney disease, or unspecified chronic kidney disease: Secondary | ICD-10-CM | POA: Diagnosis not present

## 2020-04-13 DIAGNOSIS — S22080D Wedge compression fracture of T11-T12 vertebra, subsequent encounter for fracture with routine healing: Secondary | ICD-10-CM | POA: Diagnosis not present

## 2020-04-13 DIAGNOSIS — G894 Chronic pain syndrome: Secondary | ICD-10-CM | POA: Diagnosis not present

## 2020-04-13 DIAGNOSIS — Z87891 Personal history of nicotine dependence: Secondary | ICD-10-CM | POA: Diagnosis not present

## 2020-05-01 DIAGNOSIS — Z7901 Long term (current) use of anticoagulants: Secondary | ICD-10-CM | POA: Diagnosis not present

## 2020-05-01 DIAGNOSIS — I4891 Unspecified atrial fibrillation: Secondary | ICD-10-CM | POA: Diagnosis not present

## 2020-05-29 DIAGNOSIS — I4891 Unspecified atrial fibrillation: Secondary | ICD-10-CM | POA: Diagnosis not present

## 2020-05-29 DIAGNOSIS — Z7901 Long term (current) use of anticoagulants: Secondary | ICD-10-CM | POA: Diagnosis not present

## 2020-06-26 DIAGNOSIS — I4891 Unspecified atrial fibrillation: Secondary | ICD-10-CM | POA: Diagnosis not present

## 2020-06-26 DIAGNOSIS — H6123 Impacted cerumen, bilateral: Secondary | ICD-10-CM | POA: Diagnosis not present

## 2020-06-26 DIAGNOSIS — Z7901 Long term (current) use of anticoagulants: Secondary | ICD-10-CM | POA: Diagnosis not present

## 2020-06-26 DIAGNOSIS — J309 Allergic rhinitis, unspecified: Secondary | ICD-10-CM | POA: Diagnosis not present

## 2020-07-03 ENCOUNTER — Ambulatory Visit (INDEPENDENT_AMBULATORY_CARE_PROVIDER_SITE_OTHER): Payer: Medicare Other | Admitting: Internal Medicine

## 2020-07-17 DIAGNOSIS — Z7901 Long term (current) use of anticoagulants: Secondary | ICD-10-CM | POA: Diagnosis not present

## 2020-07-17 DIAGNOSIS — H6123 Impacted cerumen, bilateral: Secondary | ICD-10-CM | POA: Diagnosis not present

## 2020-07-17 DIAGNOSIS — J309 Allergic rhinitis, unspecified: Secondary | ICD-10-CM | POA: Diagnosis not present

## 2020-07-17 DIAGNOSIS — I4891 Unspecified atrial fibrillation: Secondary | ICD-10-CM | POA: Diagnosis not present

## 2020-08-07 DIAGNOSIS — J309 Allergic rhinitis, unspecified: Secondary | ICD-10-CM | POA: Diagnosis not present

## 2020-08-07 DIAGNOSIS — H6123 Impacted cerumen, bilateral: Secondary | ICD-10-CM | POA: Diagnosis not present

## 2020-08-07 DIAGNOSIS — Z7901 Long term (current) use of anticoagulants: Secondary | ICD-10-CM | POA: Diagnosis not present

## 2020-08-07 DIAGNOSIS — I4891 Unspecified atrial fibrillation: Secondary | ICD-10-CM | POA: Diagnosis not present

## 2020-08-07 DIAGNOSIS — Z0001 Encounter for general adult medical examination with abnormal findings: Secondary | ICD-10-CM | POA: Diagnosis not present

## 2020-09-03 DIAGNOSIS — J309 Allergic rhinitis, unspecified: Secondary | ICD-10-CM | POA: Diagnosis not present

## 2020-09-03 DIAGNOSIS — Z7901 Long term (current) use of anticoagulants: Secondary | ICD-10-CM | POA: Diagnosis not present

## 2020-09-03 DIAGNOSIS — H6123 Impacted cerumen, bilateral: Secondary | ICD-10-CM | POA: Diagnosis not present

## 2020-09-03 DIAGNOSIS — I4891 Unspecified atrial fibrillation: Secondary | ICD-10-CM | POA: Diagnosis not present

## 2020-10-01 DIAGNOSIS — I4891 Unspecified atrial fibrillation: Secondary | ICD-10-CM | POA: Diagnosis not present

## 2020-10-01 DIAGNOSIS — R791 Abnormal coagulation profile: Secondary | ICD-10-CM | POA: Diagnosis not present

## 2020-10-01 DIAGNOSIS — K219 Gastro-esophageal reflux disease without esophagitis: Secondary | ICD-10-CM | POA: Diagnosis not present

## 2020-10-01 DIAGNOSIS — S22030D Wedge compression fracture of third thoracic vertebra, subsequent encounter for fracture with routine healing: Secondary | ICD-10-CM | POA: Diagnosis not present

## 2020-10-01 DIAGNOSIS — R2689 Other abnormalities of gait and mobility: Secondary | ICD-10-CM | POA: Diagnosis not present

## 2020-10-01 DIAGNOSIS — Z7901 Long term (current) use of anticoagulants: Secondary | ICD-10-CM | POA: Diagnosis not present

## 2020-10-01 DIAGNOSIS — J309 Allergic rhinitis, unspecified: Secondary | ICD-10-CM | POA: Diagnosis not present

## 2020-10-02 DIAGNOSIS — K219 Gastro-esophageal reflux disease without esophagitis: Secondary | ICD-10-CM | POA: Diagnosis not present

## 2020-10-02 DIAGNOSIS — R7301 Impaired fasting glucose: Secondary | ICD-10-CM | POA: Diagnosis not present

## 2020-10-02 DIAGNOSIS — I10 Essential (primary) hypertension: Secondary | ICD-10-CM | POA: Diagnosis not present

## 2020-10-02 DIAGNOSIS — R634 Abnormal weight loss: Secondary | ICD-10-CM | POA: Diagnosis not present

## 2020-10-02 DIAGNOSIS — J309 Allergic rhinitis, unspecified: Secondary | ICD-10-CM | POA: Diagnosis not present

## 2020-10-02 DIAGNOSIS — F5101 Primary insomnia: Secondary | ICD-10-CM | POA: Diagnosis not present

## 2020-10-02 DIAGNOSIS — G4710 Hypersomnia, unspecified: Secondary | ICD-10-CM | POA: Diagnosis not present

## 2020-10-02 DIAGNOSIS — R519 Headache, unspecified: Secondary | ICD-10-CM | POA: Diagnosis not present

## 2020-10-02 DIAGNOSIS — Z9181 History of falling: Secondary | ICD-10-CM | POA: Diagnosis not present

## 2020-10-02 DIAGNOSIS — Z681 Body mass index (BMI) 19 or less, adult: Secondary | ICD-10-CM | POA: Diagnosis not present

## 2020-10-02 DIAGNOSIS — I482 Chronic atrial fibrillation, unspecified: Secondary | ICD-10-CM | POA: Diagnosis not present

## 2020-10-02 DIAGNOSIS — M4850XD Collapsed vertebra, not elsewhere classified, site unspecified, subsequent encounter for fracture with routine healing: Secondary | ICD-10-CM | POA: Diagnosis not present

## 2020-10-02 DIAGNOSIS — K5900 Constipation, unspecified: Secondary | ICD-10-CM | POA: Diagnosis not present

## 2020-10-02 DIAGNOSIS — F0390 Unspecified dementia without behavioral disturbance: Secondary | ICD-10-CM | POA: Diagnosis not present

## 2020-10-02 DIAGNOSIS — M179 Osteoarthritis of knee, unspecified: Secondary | ICD-10-CM | POA: Diagnosis not present

## 2020-10-02 DIAGNOSIS — E785 Hyperlipidemia, unspecified: Secondary | ICD-10-CM | POA: Diagnosis not present

## 2020-10-08 DIAGNOSIS — Z7901 Long term (current) use of anticoagulants: Secondary | ICD-10-CM | POA: Diagnosis not present

## 2020-10-08 DIAGNOSIS — R791 Abnormal coagulation profile: Secondary | ICD-10-CM | POA: Diagnosis not present

## 2020-10-08 DIAGNOSIS — R2689 Other abnormalities of gait and mobility: Secondary | ICD-10-CM | POA: Diagnosis not present

## 2020-10-08 DIAGNOSIS — S22030D Wedge compression fracture of third thoracic vertebra, subsequent encounter for fracture with routine healing: Secondary | ICD-10-CM | POA: Diagnosis not present

## 2020-10-31 DIAGNOSIS — Z7901 Long term (current) use of anticoagulants: Secondary | ICD-10-CM | POA: Diagnosis not present

## 2020-10-31 DIAGNOSIS — S8010XA Contusion of unspecified lower leg, initial encounter: Secondary | ICD-10-CM | POA: Diagnosis not present

## 2020-10-31 DIAGNOSIS — K219 Gastro-esophageal reflux disease without esophagitis: Secondary | ICD-10-CM | POA: Diagnosis not present

## 2020-10-31 DIAGNOSIS — I4891 Unspecified atrial fibrillation: Secondary | ICD-10-CM | POA: Diagnosis not present

## 2020-10-31 NOTE — Progress Notes (Signed)
CONSULT NOTE  Patient Care Team: Celene Squibb, MD as PCP - General (Internal Medicine)  CHIEF COMPLAINTS/PURPOSE OF CONSULTATION: Thrombocytosis   HISTORY OF PRESENTING ILLNESS:  Leslie Rosario 86 y.o. female is here because of thrombocytosis.   Leslie Rosario is a 86 year old female with past medical history significant for hypertension, hyperlipidemia, atrial fibrillation and anticoagulation currently on warfarin, compression fracture of T12 who was found to have elevated platelet counts dating back to September 2018.  She had a normal platelet count in October 2015. Her counts have ranged from 450-989 .  She also has had intermittent leukocytosis ranging from normal to 12.1.  More recently she was evaluated by Dr. Nevada Crane  (09/01/20) her PCP and found to have a platelet count of 989,000, normal white count, normal hemoglobin with an elevated neutrophil count of 7.2.  She was also noted to have an elevated creatinine 1.97, low sodium level 133 and an elevated potassium level of 6.5.  Redraw all of her labs a week later show improvement in creatinine 1.56 and her potassium level returned to normal at 4.8.  She is a relatively healthy 86 year old.  She lives at [facility].  Her only complaint today is right-sided rib/pelvic pain secondary to compression fracture.  This is chronic and has been present for greater than 1 year.  She denies any hyperviscosity symptoms including headaches no confusion or vision changes.  She denies any blood clots.  She does have history of atrial fibrillation and is on Coumadin.  INR is therapeutic.  She denies any recent infections, fevers or night sweats.  Has constipation and requires laxatives.  She uses a walker for ambulation and currently is receiving physical therapy.  Her appetite is fair. She takes narcotics for her pain.  No abdominal surgeries.  Has occasional shortness of breath.  Patient is a former smoker.  She quit when she retired back in 1985.   She states she smoked a few cigarettes daily for at least 5 years.  She has past family history significant for stomach cancer (father) and her mother also had cancer unclear what type.  Her brother has dementia.  Her son also had kidney cancer.  She believes her brother died of leukemia at the age of 69.  MEDICAL HISTORY:  Past Medical History:  Diagnosis Date  . Anxiety   . Arthritis   . Atrial fibrillation ([place])   . Constipation   . Depression   . Dyspnea   . Dysrhythmia    A-Fib  . GERD (gastroesophageal reflux disease)   . Hyperlipidemia   . Hypertension   . Hypothyroidism   . Nausea & vomiting   . T12 compression fracture ([place]) 06/2017  . Urinary frequency     SURGICAL HISTORY: Past Surgical History:  Procedure Laterality Date  . COLONOSCOPY    . KYPHOPLASTY N/A 06/30/2018   Procedure: KYPHOPLASTY L1 and T12;  Surgeon: Melina Schools, MD;  Location: [facility];  Service: Orthopedics;  Laterality: N/A;  90 mins  . NM MYOCAR PERF WALL MOTION  04/2006   dipyridamole; normal pattern of perfusion to all regions, post-stress EF 73%  . TONSILLECTOMY    . TRANSTHORACIC ECHOCARDIOGRAM  04/2012   EF=>55%, borderline conc LVH; LA mod dilated; RA mildly dilated; mild mitral annular calcif, mod MR; mild-mod TR with elevated RSVP 30-30mmHg, mild pulm HTN; mild calcif of AV leaflets, AV mildly sclerotic; aortic root sclerosis/calcif  . UPPER GASTROINTESTINAL ENDOSCOPY      SOCIAL HISTORY: Social  History   Socioeconomic History  . Marital status: Widowed    Spouse name: Not on file  . Number of children: 3  . Years of education: Not on file  . Highest education level: Not on file  Occupational History    Employer: RETIRED  Tobacco Use  . Smoking status: Former Smoker    Quit date: 01/10/1985    Years since quitting: 35.8  . Smokeless tobacco: Never Used  . Tobacco comment: never inhaled  Vaping Use  . Vaping Use: Never used  Substance and Sexual Activity  . Alcohol use: No     Alcohol/week: 0.0 standard drinks  . Drug use: No  . Sexual activity: Not Currently  Other Topics Concern  . Not on file  Social History Narrative  . Not on file   Social Determinants of Health   Financial Resource Strain: Low Risk   . Difficulty of Paying Living Expenses: Not hard at all  Food Insecurity: No Food Insecurity  . Worried About Charity fundraiser in the Last Year: Never true  . Ran Out of Food in the Last Year: Never true  Transportation Needs: No Transportation Needs  . Lack of Transportation (Medical): No  . Lack of Transportation (Non-Medical): No  Physical Activity: Inactive  . Days of Exercise per Week: 0 days  . Minutes of Exercise per Session: 0 min  Stress: No Stress Concern Present  . Feeling of Stress : Not at all  Social Connections: Socially Isolated  . Frequency of Communication with Friends and Family: More than three times a week  . Frequency of Social Gatherings with Friends and Family: Three times a week  . Attends Religious Services: Never  . Active Member of Clubs or Organizations: No  . Attends Archivist Meetings: Never  . Marital Status: Widowed  Intimate Partner Violence: Not At Risk  . Fear of Current or Ex-Partner: No  . Emotionally Abused: No  . Physically Abused: No  . Sexually Abused: No    FAMILY HISTORY: Family History  Problem Relation Age of Onset  . Cancer Mother   . Ovarian cancer Mother   . Cancer Father   . Cancer Brother   . Emphysema Brother   . Emphysema Brother   . Pneumonia Brother   . Aneurysm Brother   . Healthy Son   . Sleep apnea Son   . Aortic aneurysm Other   . Kidney cancer Son     ALLERGIES:  is allergic to adhesive [tape], codeine, prednisone, sulfonamide derivatives, and vicodin [hydrocodone-acetaminophen].  MEDICATIONS:  Current Outpatient Medications  Medication Sig Dispense Refill  . ALPRAZolam (XANAX) 0.5 MG tablet Take 1 tablet (0.5 mg total) by mouth at bedtime as needed for  anxiety or sleep.  0  . bisacodyl (DULCOLAX) 10 MG suppository Place 10 mg rectally every three (3) days as needed for moderate constipation.    . DULoxetine (CYMBALTA) 20 MG capsule Take 20 mg by mouth daily.    . feeding supplement, ENSURE ENLIVE, (ENSURE ENLIVE) LIQD Take 237 mLs by mouth 2 (two) times daily between meals.    . furosemide (LASIX) 20 MG tablet Take by mouth.    Marland Kitchen ipratropium (ATROVENT) 0.03 % nasal spray Place into both nostrils.    Marland Kitchen levothyroxine (SYNTHROID, LEVOTHROID) 75 MCG tablet Take 75 mcg by mouth daily before breakfast.    . loratadine (CLARITIN) 5 MG chewable tablet [id number]  loratadine 5 mg tablet Administer 1 tab(s) By mouth once a day    .  metoprolol (TOPROL-XL) 25 MG 24 hr tablet Take 12.5 mg by mouth at bedtime.     Marland Kitchen oxyCODONE-acetaminophen (PERCOCET) 5-325 MG tablet Take 1 tablet by mouth every 6 (six) hours as needed for moderate pain. 6 tablet 0  . pantoprazole (PROTONIX) 40 MG tablet Take 40 mg by mouth daily.    . potassium chloride (MICRO-K) 10 MEQ CR capsule Take 10 mEq by mouth daily.    . simvastatin (ZOCOR) 10 MG tablet Take 10 mg by mouth at bedtime.    . sucralfate (CARAFATE) 1 g tablet Take by mouth.    . warfarin (COUMADIN) 3 MG tablet Take 3 mg by mouth daily. Patient states that she is taking 3 mg daily. On Tuesday and Thursdays, she takes 2.    . warfarin (COUMADIN) 6 MG tablet Take by mouth.     No current facility-administered medications for this visit.    REVIEW OF SYSTEMS:   Review of Systems  Constitutional: Positive for malaise/fatigue and weight loss. Negative for chills and fever.  HENT: Negative for congestion, ear pain and tinnitus.   Eyes: Negative.  Negative for blurred vision and double vision.  Respiratory: Positive for shortness of breath. Negative for cough and sputum production.   Cardiovascular: Negative.  Negative for chest pain, palpitations and leg swelling.  Gastrointestinal: Negative.  Negative for abdominal pain,  constipation, diarrhea, nausea and vomiting.  Genitourinary: Negative for dysuria, frequency and urgency.  Musculoskeletal: Negative for back pain and falls.  Skin: Negative.  Negative for rash.  Neurological: Positive for weakness. Negative for headaches.  Endo/Heme/Allergies: Negative.  Does not bruise/bleed easily.  Psychiatric/Behavioral: Negative.  Negative for depression. The patient is not nervous/anxious and does not have insomnia.     PHYSICAL EXAMINATION: ECOG PERFORMANCE STATUS: 2 - Symptomatic, <50% confined to bed  Vitals:   11/01/20 0911  BP: 132/65  Pulse: 70  Resp: 16  Temp: (!) 97.1 F (36.2 C)  SpO2: 100%   There were no vitals filed for this visit.  Physical Exam Constitutional:      Appearance: Normal appearance.  HENT:     Head: Normocephalic and atraumatic.  Eyes:     Pupils: Pupils are equal, round, and reactive to light.  Cardiovascular:     Rate and Rhythm: Normal rate and regular rhythm.     Heart sounds: Normal heart sounds. No murmur heard.   Pulmonary:     Effort: Pulmonary effort is normal.     Breath sounds: Normal breath sounds. No wheezing.  Abdominal:     General: Bowel sounds are normal. There is no distension.     Palpations: Abdomen is soft.     Tenderness: There is no abdominal tenderness.  Musculoskeletal:        General: Normal range of motion.     Cervical back: Normal range of motion.     Thoracic back: Bony tenderness present.       Back:  Skin:    General: Skin is warm and dry.     Findings: No rash.  Neurological:     Mental Status: She is alert and oriented to person, place, and time.  Psychiatric:        Judgment: Judgment normal.     LABORATORY DATA:  I have reviewed the data as listed Recent Results (from the past 2160 hour(s))  CBC with Differential/Platelet     Status: Abnormal   Collection Time: 11/01/20 11:02 AM  Result Value Ref Range   WBC 12.1 (H)  4.0 - 10.5 K/uL   RBC 4.05 3.87 - 5.11 MIL/uL    Hemoglobin 12.6 12.0 - 15.0 g/dL   HCT 39.3 36 - 46 %   MCV 97.0 80.0 - 100.0 fL   MCH 31.1 26.0 - 34.0 pg   MCHC 32.1 30.0 - 36.0 g/dL   RDW 17.9 (H) 11.5 - 15.5 %   Platelets 977 (HH) 150 - 400 K/uL    Comment: PLATELET COUNT CONFIRMED BY SMEAR SPECIMEN CHECKED FOR CLOTS THIS CRITICAL RESULT HAS VERIFIED AND BEEN CALLED TO [facility] BY TAYLOR JONES ON 11 18 2021 AT 1210, AND HAS BEEN READ BACK.     nRBC 0.0 0.0 - 0.2 %   Neutrophils Relative % 71 %   Neutro Abs 8.6 (H) 1.7 - 7.7 K/uL   Lymphocytes Relative 14 %   Lymphs Abs 1.7 0.7 - 4.0 K/uL   Monocytes Relative 9 %   Monocytes Absolute 1.0 0.1 - 1.0 K/uL   Eosinophils Relative 5 %   Eosinophils Absolute 0.6 (H) 0.0 - 0.5 K/uL   Basophils Relative 1 %   Basophils Absolute 0.1 0.0 - 0.1 K/uL   Immature Granulocytes 0 %   Abs Immature Granulocytes 0.05 0.00 - 0.07 K/uL   Giant PLTs PRESENT     Comment: Performed at [facility], [address] [place], [place] [address]  Comprehensive metabolic panel     Status: Abnormal   Collection Time: 11/01/20 11:02 AM  Result Value Ref Range   Sodium 135 135 - 145 mmol/L   Potassium 4.9 3.5 - 5.1 mmol/L   Chloride 102 98 - 111 mmol/L   CO2 25 22 - 32 mmol/L   Glucose, Bld 91 70 - 99 mg/dL    Comment: Glucose reference range applies only to samples taken after fasting for at least 8 hours.   BUN 33 (H) 8 - 23 mg/dL   Creatinine, Ser 1.72 (H) 0.44 - 1.00 mg/dL   Calcium 9.0 8.9 - 10.3 mg/dL   Total Protein 7.3 6.5 - 8.1 g/dL   Albumin 3.8 3.5 - 5.0 g/dL   AST 21 15 - 41 U/L   ALT 13 0 - 44 U/L   Alkaline Phosphatase 60 38 - 126 U/L   Total Bilirubin 0.6 0.3 - 1.2 mg/dL   GFR, Estimated 27 (L) >60 mL/min    Comment: (NOTE) Calculated using the CKD-EPI Creatinine Equation (2021)    Anion gap 8 5 - 15    Comment: Performed at [facility], [address] [place], [place] [address]  Rheumatoid factor     Status: None   Collection Time: 11/01/20 11:02 AM  Result Value Ref  Range   Rhuematoid fact SerPl-aCnc <10.0 0.0 - 13.9 IU/mL    Comment: (NOTE) Performed At: [facility] [place], [place] [id number] Rush Farmer MD QM:[id number]   Pathologist smear review     Status: None   Collection Time: 11/01/20 11:02 AM  Result Value Ref Range   Path Review Reviewed By Violet Baldy, M.D.     Comment: 11.19.2021 LEUKOCYTOSIS AND THROMBOCYTOSIS. FURTHER HEMATOLOGIC EVALUATION IS RECOMMENDED. Performed at [facility], [place] [address], [place], [place] [address]   Ferritin     Status: None   Collection Time: 11/01/20 11:02 AM  Result Value Ref Range   Ferritin 92 11 - 307 ng/mL    Comment: Performed at [facility], [address] [place], [place] [address]    RADIOGRAPHIC STUDIES: I  have personally reviewed the radiological images as listed and agreed with the findings in the report. No results found.  ASSESSMENT & PLAN:  Thrombocytosis: -Dating back to 2018 ranging from 450-989. -Has intermittent leukocytosis ranging from normal to 12.1.  Most recent neutrophil count was 7.2 with a normal white count. -Denies any vasomotor symptoms -Denies any history of blood clots  -Denies any splenectomy. -No family history of autoimmune or connective tissue disorders -Denies any inflammation or infection.   Intermittent leukocytosis: -Most recent white count 9.8 with elevated absolute neutrophil count of 7.2 -Intermittent since 2008 ranging from normal to 16.3. -Denies any infections or chronic inflammation. -Denies any systemic steroid use.  -Current non-smoker-former smoker -No suspect medications  Rib pain secondary to compression fracture: -Currently on acetaminophen/hydrocodone 325 mg / 5 mg oral tablets every 6 hours for pain.  -Currently receiving physical therapy at [facility]   Constipation: -Secondary to medication, dehydration lack of exercise -Currently on MiraLAX and Senokot.  Social/family  history: -Former smoker.  Quit back in 1985.  Smoked for at least 5 years less than half a pack per day. -Family history of colon cancer, kidney cancer and leukemia.  Plan: Thrombocytosis: -Patient not symptomatic. -Repeat lab work today and get JAK2 V6 17 F, CBC with differential, CMP, pathology smear. -We will also check ANA, RF.  -Rule out iron deficiency-check ferritin and iron panel -Check SPEP and sedimentation rate given back pain, elevated kidney function and age.  Leukocytosis: -Lab work today- BCR-able, ANA, RF  Elevated kidney function: -Creatinine 1.97 and redraw improved to 1.56. -Likely multifactorial but will check SPEP.  -Encouraged hydration. -Recheck labs today  Disposition: Labs today. Return to clinic in 3 weeks for results  Addendum: I have independently evaluated this patient.  She is a resident at [facility].  She was recently evaluated by Dr. Nevada Crane and was found to have elevated platelet count of 989 along with elevated white count.  No prior history of thrombosis.  She is on Coumadin for atrial fibrillation.  She ambulates with the help of walker.  She is accompanied by her son today.  We have discussed various causes of leukocytosis and thrombocytosis.  We will evaluate for myeloproliferative disorders, connective tissue disorders and reactive status.  We will see her back in 3 weeks for follow-up.   All questions were answered. The patient knows to call the clinic with any problems, questions or concerns.     Derek Jack, MD 11/02/20 2:11 PM

## 2020-11-01 ENCOUNTER — Other Ambulatory Visit: Payer: Self-pay

## 2020-11-01 ENCOUNTER — Inpatient Hospital Stay (HOSPITAL_COMMUNITY): Payer: Medicare Other | Attending: Hematology | Admitting: Hematology

## 2020-11-01 ENCOUNTER — Inpatient Hospital Stay (HOSPITAL_COMMUNITY): Payer: Medicare Other

## 2020-11-01 ENCOUNTER — Encounter (HOSPITAL_COMMUNITY): Payer: Self-pay | Admitting: Hematology

## 2020-11-01 DIAGNOSIS — I10 Essential (primary) hypertension: Secondary | ICD-10-CM | POA: Insufficient documentation

## 2020-11-01 DIAGNOSIS — D72825 Bandemia: Secondary | ICD-10-CM

## 2020-11-01 DIAGNOSIS — K5900 Constipation, unspecified: Secondary | ICD-10-CM | POA: Insufficient documentation

## 2020-11-01 DIAGNOSIS — Z806 Family history of leukemia: Secondary | ICD-10-CM | POA: Diagnosis not present

## 2020-11-01 DIAGNOSIS — D72829 Elevated white blood cell count, unspecified: Secondary | ICD-10-CM | POA: Diagnosis not present

## 2020-11-01 DIAGNOSIS — Z79899 Other long term (current) drug therapy: Secondary | ICD-10-CM | POA: Insufficient documentation

## 2020-11-01 DIAGNOSIS — D75839 Thrombocytosis, unspecified: Secondary | ICD-10-CM | POA: Insufficient documentation

## 2020-11-01 DIAGNOSIS — F5101 Primary insomnia: Secondary | ICD-10-CM | POA: Diagnosis not present

## 2020-11-01 DIAGNOSIS — R634 Abnormal weight loss: Secondary | ICD-10-CM | POA: Diagnosis not present

## 2020-11-01 DIAGNOSIS — M179 Osteoarthritis of knee, unspecified: Secondary | ICD-10-CM | POA: Diagnosis not present

## 2020-11-01 DIAGNOSIS — Z87891 Personal history of nicotine dependence: Secondary | ICD-10-CM | POA: Diagnosis not present

## 2020-11-01 DIAGNOSIS — F32A Depression, unspecified: Secondary | ICD-10-CM | POA: Diagnosis not present

## 2020-11-01 DIAGNOSIS — I4891 Unspecified atrial fibrillation: Secondary | ICD-10-CM | POA: Diagnosis not present

## 2020-11-01 DIAGNOSIS — M4850XD Collapsed vertebra, not elsewhere classified, site unspecified, subsequent encounter for fracture with routine healing: Secondary | ICD-10-CM | POA: Diagnosis not present

## 2020-11-01 DIAGNOSIS — Z7901 Long term (current) use of anticoagulants: Secondary | ICD-10-CM | POA: Diagnosis not present

## 2020-11-01 DIAGNOSIS — F419 Anxiety disorder, unspecified: Secondary | ICD-10-CM | POA: Diagnosis not present

## 2020-11-01 DIAGNOSIS — R519 Headache, unspecified: Secondary | ICD-10-CM | POA: Diagnosis not present

## 2020-11-01 DIAGNOSIS — K219 Gastro-esophageal reflux disease without esophagitis: Secondary | ICD-10-CM | POA: Insufficient documentation

## 2020-11-01 DIAGNOSIS — Z681 Body mass index (BMI) 19 or less, adult: Secondary | ICD-10-CM | POA: Diagnosis not present

## 2020-11-01 DIAGNOSIS — E785 Hyperlipidemia, unspecified: Secondary | ICD-10-CM | POA: Diagnosis not present

## 2020-11-01 DIAGNOSIS — E039 Hypothyroidism, unspecified: Secondary | ICD-10-CM | POA: Diagnosis not present

## 2020-11-01 DIAGNOSIS — G4710 Hypersomnia, unspecified: Secondary | ICD-10-CM | POA: Diagnosis not present

## 2020-11-01 DIAGNOSIS — Z9181 History of falling: Secondary | ICD-10-CM | POA: Diagnosis not present

## 2020-11-01 DIAGNOSIS — R7989 Other specified abnormal findings of blood chemistry: Secondary | ICD-10-CM | POA: Insufficient documentation

## 2020-11-01 DIAGNOSIS — Z8051 Family history of malignant neoplasm of kidney: Secondary | ICD-10-CM | POA: Diagnosis not present

## 2020-11-01 DIAGNOSIS — I482 Chronic atrial fibrillation, unspecified: Secondary | ICD-10-CM | POA: Diagnosis not present

## 2020-11-01 DIAGNOSIS — J309 Allergic rhinitis, unspecified: Secondary | ICD-10-CM | POA: Diagnosis not present

## 2020-11-01 DIAGNOSIS — Z800 Family history of malignant neoplasm of digestive organs: Secondary | ICD-10-CM | POA: Insufficient documentation

## 2020-11-01 DIAGNOSIS — F0390 Unspecified dementia without behavioral disturbance: Secondary | ICD-10-CM | POA: Diagnosis not present

## 2020-11-01 DIAGNOSIS — Z8041 Family history of malignant neoplasm of ovary: Secondary | ICD-10-CM | POA: Diagnosis not present

## 2020-11-01 DIAGNOSIS — R7301 Impaired fasting glucose: Secondary | ICD-10-CM | POA: Diagnosis not present

## 2020-11-01 LAB — COMPREHENSIVE METABOLIC PANEL
ALT: 13 U/L (ref 0–44)
AST: 21 U/L (ref 15–41)
Albumin: 3.8 g/dL (ref 3.5–5.0)
Alkaline Phosphatase: 60 U/L (ref 38–126)
Anion gap: 8 (ref 5–15)
BUN: 33 mg/dL — ABNORMAL HIGH (ref 8–23)
CO2: 25 mmol/L (ref 22–32)
Calcium: 9 mg/dL (ref 8.9–10.3)
Chloride: 102 mmol/L (ref 98–111)
Creatinine, Ser: 1.72 mg/dL — ABNORMAL HIGH (ref 0.44–1.00)
GFR, Estimated: 27 mL/min — ABNORMAL LOW (ref >60)
Glucose, Bld: 91 mg/dL (ref 70–99)
Potassium: 4.9 mmol/L (ref 3.5–5.1)
Sodium: 135 mmol/L (ref 135–145)
Total Bilirubin: 0.6 mg/dL (ref 0.3–1.2)
Total Protein: 7.3 g/dL (ref 6.5–8.1)

## 2020-11-01 LAB — CBC WITH DIFFERENTIAL/PLATELET
Abs Immature Granulocytes: 0.05 10*3/uL (ref 0.00–0.07)
Basophils Absolute: 0.1 10*3/uL (ref 0.0–0.1)
Basophils Relative: 1 %
Eosinophils Absolute: 0.6 10*3/uL — ABNORMAL HIGH (ref 0.0–0.5)
Eosinophils Relative: 5 %
HCT: 39.3 % (ref 36.0–46.0)
Hemoglobin: 12.6 g/dL (ref 12.0–15.0)
Immature Granulocytes: 0 %
Lymphocytes Relative: 14 %
Lymphs Abs: 1.7 10*3/uL (ref 0.7–4.0)
MCH: 31.1 pg (ref 26.0–34.0)
MCHC: 32.1 g/dL (ref 30.0–36.0)
MCV: 97 fL (ref 80.0–100.0)
Monocytes Absolute: 1 10*3/uL (ref 0.1–1.0)
Monocytes Relative: 9 %
Neutro Abs: 8.6 10*3/uL — ABNORMAL HIGH (ref 1.7–7.7)
Neutrophils Relative %: 71 %
Platelets: 977 10*3/uL (ref 150–400)
RBC: 4.05 MIL/uL (ref 3.87–5.11)
RDW: 17.9 % — ABNORMAL HIGH (ref 11.5–15.5)
WBC: 12.1 10*3/uL — ABNORMAL HIGH (ref 4.0–10.5)
nRBC: 0 % (ref 0.0–0.2)

## 2020-11-01 LAB — FERRITIN: Ferritin: 92 ng/mL (ref 11–307)

## 2020-11-01 NOTE — Progress Notes (Unsigned)
Critical value alert:   Platelet count 977,000  Faythe Casa, NP aware, no orders at this time.

## 2020-11-02 LAB — PROTEIN ELECTROPHORESIS, SERUM
A/G Ratio: 1.1 (ref 0.7–1.7)
Albumin ELP: 3.6 g/dL (ref 2.9–4.4)
Alpha-1-Globulin: 0.3 g/dL (ref 0.0–0.4)
Alpha-2-Globulin: 0.9 g/dL (ref 0.4–1.0)
Beta Globulin: 0.9 g/dL (ref 0.7–1.3)
Gamma Globulin: 1.3 g/dL (ref 0.4–1.8)
Globulin, Total: 3.3 g/dL (ref 2.2–3.9)
Total Protein ELP: 6.9 g/dL (ref 6.0–8.5)

## 2020-11-02 LAB — PATHOLOGIST SMEAR REVIEW

## 2020-11-02 LAB — RHEUMATOID FACTOR: Rheumatoid fact SerPl-aCnc: <10 IU/mL (ref 0.0–13.9)

## 2020-11-03 LAB — ANTINUCLEAR ANTIBODIES, IFA: ANA Ab, IFA: POSITIVE — AB

## 2020-11-03 LAB — FANA STAINING PATTERNS: Homogeneous Pattern: 1 — ABNORMAL HIGH

## 2020-11-09 LAB — JAK2 V617F, W REFLEX TO CALR/E12/MPL

## 2020-11-14 LAB — BCR-ABL1, CML/ALL, PCR, QUANT: Interpretation (BCRAL):: NEGATIVE

## 2020-11-21 DIAGNOSIS — I4891 Unspecified atrial fibrillation: Secondary | ICD-10-CM | POA: Diagnosis not present

## 2020-11-21 DIAGNOSIS — K219 Gastro-esophageal reflux disease without esophagitis: Secondary | ICD-10-CM | POA: Diagnosis not present

## 2020-11-21 DIAGNOSIS — Z7901 Long term (current) use of anticoagulants: Secondary | ICD-10-CM | POA: Diagnosis not present

## 2020-11-21 DIAGNOSIS — S8010XA Contusion of unspecified lower leg, initial encounter: Secondary | ICD-10-CM | POA: Diagnosis not present

## 2020-11-22 ENCOUNTER — Ambulatory Visit (HOSPITAL_COMMUNITY): Payer: Medicare Other | Admitting: Hematology

## 2020-11-25 NOTE — Progress Notes (Signed)
Patient Care Team: Celene Squibb, MD as PCP - General (Internal Medicine)  CHIEF COMPLAINTS/PURPOSE OF CONSULTATION: Thrombocytosis  HISTORY OF PRESENTING ILLNESS:   Leslie Rosario 86 y.o. female is here because of thrombocytosis.   Leslie Rosario is a 86 year old female with past medical history significant for hypertension, hyperlipidemia, atrial fibrillation and anticoagulation currently on warfarin, compression fracture of T12 who was found to have elevated platelet counts dating back to September 2018.  She was evaluated by Dr. Nevada Crane  (09/01/20) her PCP and found to have a platelet count of 989,000, normal white count, normal hemoglobin with an elevated neutrophil count of 7.2.  She was also noted to have an elevated creatinine 1.97, low sodium level 133 and an elevated potassium level of 6.5.  Redraw all of her labs a week later show improvement in creatinine 1.56 and her potassium level returned to normal at 4.8.  She is a relatively healthy 86 year old.  She lives at [facility].  Patient is a former smoker.  She quit when she retired back in 1985.  She states she smoked a few cigarettes daily for at least 5 years.  She has past family history significant for stomach cancer (father) and her mother also had cancer unclear what type.  Her brother has dementia.  Her son also had kidney cancer.  She believes her brother died of leukemia at the age of 65.  Dr Delton Coombes advised to do blood work to look for ET.  She is here for FU. She tells me that her back is hurting, otherwise she denies any fevers, drenching night sweats, loss of appetite and loss of weight.   MEDICAL HISTORY:  Past Medical History:  Diagnosis Date  . Anxiety   . Arthritis   . Atrial fibrillation ([place])   . Constipation   . Depression   . Dyspnea   . Dysrhythmia    A-Fib  . GERD (gastroesophageal reflux disease)   . Hyperlipidemia   . Hypertension   . Hypothyroidism   . Nausea & vomiting   . T12  compression fracture ([place]) 06/2017  . Urinary frequency     SURGICAL HISTORY: Past Surgical History:  Procedure Laterality Date  . COLONOSCOPY    . KYPHOPLASTY N/A 06/30/2018   Procedure: KYPHOPLASTY L1 and T12;  Surgeon: Melina Schools, MD;  Location: [facility];  Service: Orthopedics;  Laterality: N/A;  90 mins  . NM MYOCAR PERF WALL MOTION  04/2006   dipyridamole; normal pattern of perfusion to all regions, post-stress EF 73%  . TONSILLECTOMY    . TRANSTHORACIC ECHOCARDIOGRAM  04/2012   EF=>55%, borderline conc LVH; LA mod dilated; RA mildly dilated; mild mitral annular calcif, mod MR; mild-mod TR with elevated RSVP 30-75mHg, mild pulm HTN; mild calcif of AV leaflets, AV mildly sclerotic; aortic root sclerosis/calcif  . UPPER GASTROINTESTINAL ENDOSCOPY      SOCIAL HISTORY: Social History   Socioeconomic History  . Marital status: Widowed    Spouse name: Not on file  . Number of children: 3  . Years of education: Not on file  . Highest education level: Not on file  Occupational History    Employer: RETIRED  Tobacco Use  . Smoking status: Former Smoker    Quit date: 01/10/1985    Years since quitting: 35.8  . Smokeless tobacco: Never Used  . Tobacco comment: never inhaled  Vaping Use  . Vaping Use: Never used  Substance and Sexual Activity  . Alcohol use: No  Alcohol/week: 0.0 standard drinks  . Drug use: No  . Sexual activity: Not Currently  Other Topics Concern  . Not on file  Social History Narrative  . Not on file   Social Determinants of Health   Financial Resource Strain: Low Risk   . Difficulty of Paying Living Expenses: Not hard at all  Food Insecurity: No Food Insecurity  . Worried About Charity fundraiser in the Last Year: Never true  . Ran Out of Food in the Last Year: Never true  Transportation Needs: No Transportation Needs  . Lack of Transportation (Medical): No  . Lack of Transportation (Non-Medical): No  Physical Activity: Inactive  . Days of  Exercise per Week: 0 days  . Minutes of Exercise per Session: 0 min  Stress: No Stress Concern Present  . Feeling of Stress : Not at all  Social Connections: Socially Isolated  . Frequency of Communication with Friends and Family: More than three times a week  . Frequency of Social Gatherings with Friends and Family: Three times a week  . Attends Religious Services: Never  . Active Member of Clubs or Organizations: No  . Attends Archivist Meetings: Never  . Marital Status: Widowed  Intimate Partner Violence: Not At Risk  . Fear of Current or Ex-Partner: No  . Emotionally Abused: No  . Physically Abused: No  . Sexually Abused: No    FAMILY HISTORY: Family History  Problem Relation Age of Onset  . Cancer Mother   . Ovarian cancer Mother   . Cancer Father   . Cancer Brother   . Emphysema Brother   . Emphysema Brother   . Pneumonia Brother   . Aneurysm Brother   . Healthy Son   . Sleep apnea Son   . Aortic aneurysm Other   . Kidney cancer Son     ALLERGIES:  is allergic to adhesive [tape], codeine, prednisone, sulfonamide derivatives, and vicodin [hydrocodone-acetaminophen].  MEDICATIONS:  Current Outpatient Medications  Medication Sig Dispense Refill  . ALPRAZolam (XANAX) 0.5 MG tablet Take 1 tablet (0.5 mg total) by mouth at bedtime as needed for anxiety or sleep.  0  . bisacodyl (DULCOLAX) 10 MG suppository Place 10 mg rectally every three (3) days as needed for moderate constipation.    . DULoxetine (CYMBALTA) 20 MG capsule Take 20 mg by mouth daily.    . feeding supplement, ENSURE ENLIVE, (ENSURE ENLIVE) LIQD Take 237 mLs by mouth 2 (two) times daily between meals.    . furosemide (LASIX) 20 MG tablet Take by mouth.    Marland Kitchen ipratropium (ATROVENT) 0.03 % nasal spray Place into both nostrils.    Marland Kitchen levothyroxine (SYNTHROID, LEVOTHROID) 75 MCG tablet Take 75 mcg by mouth daily before breakfast.    . loratadine (CLARITIN) 5 MG chewable tablet [id number]  loratadine 5  mg tablet Administer 1 tab(s) By mouth once a day    . metoprolol (TOPROL-XL) 25 MG 24 hr tablet Take 12.5 mg by mouth at bedtime.     Marland Kitchen oxyCODONE-acetaminophen (PERCOCET) 5-325 MG tablet Take 1 tablet by mouth every 6 (six) hours as needed for moderate pain. 6 tablet 0  . pantoprazole (PROTONIX) 40 MG tablet Take 40 mg by mouth daily.    . potassium chloride (MICRO-K) 10 MEQ CR capsule Take 10 mEq by mouth daily.    . simvastatin (ZOCOR) 10 MG tablet Take 10 mg by mouth at bedtime.    . sucralfate (CARAFATE) 1 g tablet Take by mouth.    Marland Kitchen  warfarin (COUMADIN) 3 MG tablet Take 3 mg by mouth daily. Patient states that she is taking 3 mg daily. On Tuesday and Thursdays, she takes 2.    . warfarin (COUMADIN) 6 MG tablet Take by mouth.     No current facility-administered medications for this visit.    REVIEW OF SYSTEMS:   Review of Systems  Constitutional: Positive for malaise/fatigue and weight loss. Negative for chills and fever.  HENT: Negative for congestion, ear pain and tinnitus.   Eyes: Negative.  Negative for blurred vision and double vision.  Respiratory: Positive for shortness of breath. Negative for cough and sputum production.   Cardiovascular: Negative.  Negative for chest pain, palpitations and leg swelling.  Gastrointestinal: Negative.  Negative for abdominal pain, constipation, diarrhea, nausea and vomiting.  Genitourinary: Negative for dysuria, frequency and urgency.  Musculoskeletal: Negative for back pain and falls.  Skin: Negative.  Negative for rash.  Neurological: Positive for weakness. Negative for headaches.  Endo/Heme/Allergies: Negative.  Does not bruise/bleed easily.  Psychiatric/Behavioral: Negative.  Negative for depression. The patient is not nervous/anxious and does not have insomnia.     PHYSICAL EXAMINATION: ECOG PERFORMANCE STATUS: 2 - Symptomatic, <50% confined to bed  There were no vitals filed for this visit. There were no vitals filed for this  visit.  Physical Exam Constitutional:      Appearance: Normal appearance.  HENT:     Head: Normocephalic and atraumatic.  Eyes:     Pupils: Pupils are equal, round, and reactive to light.  Cardiovascular:     Rate and Rhythm: Normal rate and regular rhythm.     Heart sounds: Normal heart sounds. No murmur heard.   Pulmonary:     Effort: Pulmonary effort is normal.     Breath sounds: Normal breath sounds. No wheezing.  Abdominal:     General: Bowel sounds are normal. There is no distension.     Palpations: Abdomen is soft.     Tenderness: There is no abdominal tenderness.  Musculoskeletal:        General: Normal range of motion.     Cervical back: Normal range of motion.     Thoracic back: Bony tenderness present.       Back:  Skin:    General: Skin is warm and dry.     Findings: No rash.  Neurological:     Mental Status: She is alert and oriented to person, place, and time.  Psychiatric:        Judgment: Judgment normal.     LABORATORY DATA:  I have reviewed the data as listed Recent Results (from the past 2160 hour(s))  CBC with Differential/Platelet     Status: Abnormal   Collection Time: 11/01/20 11:02 AM  Result Value Ref Range   WBC 12.1 (H) 4.0 - 10.5 K/uL   RBC 4.05 3.87 - 5.11 MIL/uL   Hemoglobin 12.6 12.0 - 15.0 g/dL   HCT 39.3 36.0 - 46.0 %   MCV 97.0 80.0 - 100.0 fL   MCH 31.1 26.0 - 34.0 pg   MCHC 32.1 30.0 - 36.0 g/dL   RDW 17.9 (H) 11.5 - 15.5 %   Platelets 977 (HH) 150 - 400 K/uL    Comment: PLATELET COUNT CONFIRMED BY SMEAR SPECIMEN CHECKED FOR CLOTS THIS CRITICAL RESULT HAS VERIFIED AND BEEN CALLED TO [facility]WARDS BY TAYLOR JONES ON 11 18 2021 AT 1210, AND HAS BEEN READ BACK.     nRBC 0.0 0.0 - 0.2 %   Neutrophils  Relative % 71 %   Neutro Abs 8.6 (H) 1.7 - 7.7 K/uL   Lymphocytes Relative 14 %   Lymphs Abs 1.7 0.7 - 4.0 K/uL   Monocytes Relative 9 %   Monocytes Absolute 1.0 0.1 - 1.0 K/uL   Eosinophils Relative 5 %   Eosinophils  Absolute 0.6 (H) 0.0 - 0.5 K/uL   Basophils Relative 1 %   Basophils Absolute 0.1 0.0 - 0.1 K/uL   Immature Granulocytes 0 %   Abs Immature Granulocytes 0.05 0.00 - 0.07 K/uL   Giant PLTs PRESENT     Comment: Performed at [facility], [address], [place], [place] [address]  Comprehensive metabolic panel     Status: Abnormal   Collection Time: 11/01/20 11:02 AM  Result Value Ref Range   Sodium 135 135 - 145 mmol/L   Potassium 4.9 3.5 - 5.1 mmol/L   Chloride 102 98 - 111 mmol/L   CO2 25 22 - 32 mmol/L   Glucose, Bld 91 70 - 99 mg/dL    Comment: Glucose reference range applies only to samples taken after fasting for at least 8 hours.   BUN 33 (H) 8 - 23 mg/dL   Creatinine, Ser 1.72 (H) 0.44 - 1.00 mg/dL   Calcium 9.0 8.9 - 10.3 mg/dL   Total Protein 7.3 6.5 - 8.1 g/dL   Albumin 3.8 3.5 - 5.0 g/dL   AST 21 15 - 41 U/L   ALT 13 0 - 44 U/L   Alkaline Phosphatase 60 38 - 126 U/L   Total Bilirubin 0.6 0.3 - 1.2 mg/dL   GFR, Estimated 27 (L) >60 mL/min    Comment: (NOTE) Calculated using the CKD-EPI Creatinine Equation (2021)    Anion gap 8 5 - 15    Comment: Performed at [facility] [place], [address] [place], [place] [address]  JAK2 V617F, w Reflex to CALR/E12/MPL     Status: Abnormal   Collection Time: 11/01/20 11:02 AM  Result Value Ref Range   JAK2 GenotypR Comment (A)     Comment: (NOTE) Result:  POSITIVE for the detection of the V617F mutation. Interpretation:  The assay detected the presence of a G to T nucleotide change encoding the V617F mutation within JAK2. Interpretation of this result should be made in the context of other clinical, morphologic, and cytogenetic findings.    BACKGROUND: Comment     Comment: (NOTE) JAK2 is a cytoplasmic tyrosine kinase with a key role in signal transduction from multiple hematopoietic growth factor receptors. A point mutation within exon 14 of the JAK2 gene ([id number]) encoding a valine to phenylalanine substitution at position 617 of  the JAK2 protein (V617F) has been identified in most patients with polycythemia vera, and in about half of those with either essential thrombocythemia or idiopathic myelofibrosis. The V617F has also been detected, although infrequently, in other myeloid disorders such as chronic myelomonocytic leukemia and chronic neutrophilic luekemia. V617F is an acquired mutation that alters a highly conserved valine present in the negative regulatory JH2 domain of the JAK2 protein and is predicted to dysregulate kinase activity. Methodology: Total genomic DNA was extracted and subjected to TaqMan real-time PCR amplification/detection. Two amplification products per sample were monitored by real-time PCR using primers/probes s pecific to JAK2 wild type (WT) and JAK2 mutant V617F. The ABI7900 Absolute Quantitation software will compare the patient specimen valuse to the standard curves and generate percent values for wild type and mutant type. In vitro studies have indicated that this assay has an  analytical sensitivity of 1%. References: Baxter EJ, Scott Phineas Real, et al. Acquired mutation of the tyrosine kinase JAK2 in human myeloproliferative disorders. Lancet. 2005 Mar 19-25; 365(9464):1054-1061. Alfonso Ramus Couedic JP. A unique clonal JAK2 mutation leading to constitutive signaling causes polycythaemia vera. Nature. 2005 Apr 28; 434(7037):1144-1148. Kralovics R, Passamonti F, Buser AS, et al. A gain-of-function mutation of JAK2 in myeloproliferative disorders. N Engl J Med. 2005 Apr 28; 352(17):1779-1790.    Director Review, JAK2 Comment     Comment: (NOTE) Katina Degree, MD, PhD Director, [facility] and [place], [place] [address] [phone] This test was developed and its performance characteristics determined by [facility]. It has not been cleared or approved by the Food and Drug Administration.    REFLEX:  Comment     Comment: (NOTE) Reflex to CALR Mutation Analysis, JAK2 Exon 12-15 Mutation Analysis, and MPL Mutation Analysis is not indicated.    Extraction Completed     Comment: (NOTE) Performed At: [facility] [address] [place], [place] [id number] Katina Degree MDPhD [id number] Performed At: [facility] [address] [place], [place] [id number] Katina Degree MDPhD [id number]   BCR-ABL1, CML/ALL, PCR, QUANT     Status: None   Collection Time: 11/01/20 11:02 AM  Result Value Ref Range   b2a2 transcript Comment %    Comment:            <0.0032 %   b3a2 transcript Comment %    Comment:            <0.0032 %   E1A2 Transcript Comment %    Comment:            <0.0032 %   Interpretation (BCRAL): Negative     Comment: (NOTE) NEGATIVE for the BCR-ABL1 e1a2 (p190), e13a2 (b2a2, p210) and e14a2 (b3a2, p210) fusion transcripts. These results do not rule out the presence of rare BCR-ABL1 transcripts not detected by this assay.    Director Review [facility]): Comment     Comment: (NOTE) Constance Goltz, PhD, [facility]               Director, [place] for [facility] and [place], [place]    Background: Comment     Comment: (NOTE) This assay can detect three different types of BCR-ABL1 fusion transcripts associated with CML, ALL, and AML: e13a2 (previously b2a2) and e14a2 (previously b3a2) (major breakpoint, p210), as well as e1a2 (minor breakpoint, p190). The e13a2 and e14a2 transcript values are titrated to the current International Scale (IS). The standardized baseline is 100% BCR-ABL1 (IS) and major molecular response (MMR) is equivalent to 0.1% BCR-ABL1 (IS) corresponding to a 3-log reduction. Results should be correlated with appropriate clinical and laboratory information as indicated.    Methodology Comment     Comment: (NOTE) Total RNA is isolated  from the sample and subject to a real-time, reverse transcriptase polymerase chain reaction (RT-PCR). The PCR primers and probes are specific for BCR-ABL1 e13a2, e14a2 and e1a2 fusion transcripts. The ABL1 transcript is amplified as the control for cDNA quantity and quality. Serial dilutions  of a validated positive control RNA with known t(9;22) BCR-ABL1 are used as reference for quantification of BCR-ABL1 relative to ABL1. The numeric BCR-ABL1 level is reported as % BCR-ABL1/ABL1 and the detection sensitivity is 4.5 log below the standard baseline (<0.0032%). This test was developed and its performance characteristics determined by [facility]. It has not been cleared or approved by the Food and Drug Administration. Performed At: [facility] [address] [place], [place] [id number] Katina Degree MDPhD [id number]:[id number] Performed At: [facility] [address] [place], [place] [id number] Katina Degree MDPhD [id number]   ANA, IFA (with reflex)     Status: Abnormal   Collection Time: 11/01/20 11:02 AM  Result Value Ref Range   ANA Ab, IFA Positive (A)     Comment: (NOTE)                                     Negative   <1:80                                     Borderline  1:80                                     Positive   >1:80 Performed At: [facility] [place], [place] [id number] Rush Farmer MD OZ:[id number]   Rheumatoid factor     Status: None   Collection Time: 11/01/20 11:02 AM  Result Value Ref Range   Rhuematoid fact SerPl-aCnc <10.0 0.0 - 13.9 IU/mL    Comment: (NOTE) Performed At: [facility] [place], [place] [id number] Rush Farmer MD IH:[id number]   Pathologist smear review     Status: None   Collection Time: 11/01/20 11:02 AM  Result Value Ref Range   Path Review Reviewed By Violet Baldy, M.D.     Comment: 11.19.2021 LEUKOCYTOSIS AND THROMBOCYTOSIS. FURTHER HEMATOLOGIC EVALUATION IS RECOMMENDED. Performed  at [facility], [place] [address], [place], [place] [address]   Ferritin     Status: None   Collection Time: 11/01/20 11:02 AM  Result Value Ref Range   Ferritin 92 11 - 307 ng/mL    Comment: Performed at [facility], [address] [place], [place] [address]  FANA Staining Patterns     Status: Abnormal   Collection Time: 11/01/20 11:02 AM  Result Value Ref Range   Homogeneous Pattern 1 (H)     Comment: 1:320 ICAP nomenclature: AC    Note: Comment     Comment: (NOTE) For more information about Hep-2 cell patterns use ANApatterns.org, the official website for the International Consensus on Antinuclear Antibody (ANA) Patterns (ICAP). ------------------------------------------------------------ A positive ANA result may occur in healthy individuals (low titer) or be associated with a variety of diseases.  See interpretation chart which is not all inclusive: Pattern      Antigen Detected  Suggested Disease Association -----------  ----------------  ----------------------------- Homogeneous  DNA(ds,ss),       SLE - High titers             Nucleosomes,             Histones          Drug-induced SLE -----------  ----------------  ----------------------------- Belinda Block  Sm, RNP, SCL-70,  SLE,MCTD,PSS (diffuse form),             SS-A/SS-B         Sjogrens -----------  ----------------  ----------------------------- Nucleolar    SCL-70, PM-1/SCL  High titers Scleroderma,                               PM/DM -----------  ---------- ------  ----------------------------- Centromere   Centromere        PSS (limited form) w/Crest                               syndrome variable -----------  ----------------  ----------------------------- Nuclear Dot  Sp100,p80-coilin  Primary Biliary Cirrhosis -----------  ----------------  ----------------------------- Nuclear      GP210,            Primary Biliary Cirrhosis Membrane     lamin A,B,C -----------  ----------------   ----------------------------- Performed At: [facility] [place], [place] [id number] Rush Farmer MD JA:[id number]   Protein electrophoresis, serum     Status: None   Collection Time: 11/01/20 11:03 AM  Result Value Ref Range   Total Protein ELP 6.9 6.0 - 8.5 g/dL   Albumin ELP 3.6 2.9 - 4.4 g/dL   Alpha-1-Globulin 0.3 0.0 - 0.4 g/dL   Alpha-2-Globulin 0.9 0.4 - 1.0 g/dL   Beta Globulin 0.9 0.7 - 1.3 g/dL   Gamma Globulin 1.3 0.4 - 1.8 g/dL   M-Spike, % Not Observed Not Observed g/dL   SPE Interp. Comment     Comment: (NOTE) The SPE pattern appears unremarkable. Evidence of monoclonal protein is not apparent. Performed At: [facility] [place], [place] [id number] Rush Farmer MD OX:[id number]    Comment Comment     Comment: (NOTE) Protein electrophoresis scan will follow via computer, mail, or courier delivery.    Globulin, Total 3.3 2.2 - 3.9 g/dL   A/G Ratio 1.1 0.7 - 1.7    RADIOGRAPHIC STUDIES: I have personally reviewed the radiological images as listed and agreed with the findings in the report. No results found.  ASSESSMENT & PLAN:   JAK 2 positive essential Thrombocytosis: -Dating back to 2018 ranging from 450-989. -Has intermittent leukocytosis ranging from normal to 12.1.  Most recent neutrophil count was 7.2 with a normal white count. -Denies any vasomotor symptoms -Denies any history of blood clots  -Denies any splenectomy. -No family history of autoimmune or connective tissue disorders -Denies any inflammation or infection.   Already on anticoagulation. Discussed increased risk of VTE with ET.  Will recommend low dose hydrea as well. Discussed about mechanism of action of hydrea and side effects of hydrea including fatigue, mouth ulcers, skin rash, lower extremity swelling, cytopenias etc. I gave her some printed information about hydrea. She will continue anticoagulation RTC with Dr Raliegh Ip in about 4 weeks  and will make the decision about considering hydrea. I discussed this is a pre cancerous condition and can rarely transform to AML.  Intermittent leukocytosis: -Most recent white count 12.1 with elevated absolute neutrophil count of 8.6 -Intermittent since 2008 ranging from normal to 16.3. -Denies any infections or chronic inflammation. -Denies any systemic steroid use.   JAK 2 positive Likely related to MPN  Social/family history: -Former smoker.  Quit back in 1985.  Smoked for at least 5 years less than half a pack  per day. -Family history of colon cancer, kidney cancer and leukemia.  Benay Pike MD

## 2020-11-26 ENCOUNTER — Inpatient Hospital Stay (HOSPITAL_COMMUNITY): Payer: Medicare Other | Attending: Hematology | Admitting: Hematology and Oncology

## 2020-11-26 ENCOUNTER — Other Ambulatory Visit: Payer: Self-pay

## 2020-11-26 ENCOUNTER — Encounter (HOSPITAL_COMMUNITY): Payer: Self-pay | Admitting: Hematology and Oncology

## 2020-11-26 VITALS — BP 125/64 | HR 66 | Temp 97.7°F | Resp 16 | Wt 99.9 lb

## 2020-11-26 DIAGNOSIS — D72829 Elevated white blood cell count, unspecified: Secondary | ICD-10-CM | POA: Insufficient documentation

## 2020-11-26 DIAGNOSIS — I4891 Unspecified atrial fibrillation: Secondary | ICD-10-CM | POA: Diagnosis not present

## 2020-11-26 DIAGNOSIS — Z87891 Personal history of nicotine dependence: Secondary | ICD-10-CM | POA: Diagnosis not present

## 2020-11-26 DIAGNOSIS — Z7901 Long term (current) use of anticoagulants: Secondary | ICD-10-CM | POA: Diagnosis not present

## 2020-11-26 DIAGNOSIS — Z800 Family history of malignant neoplasm of digestive organs: Secondary | ICD-10-CM | POA: Insufficient documentation

## 2020-11-26 DIAGNOSIS — R634 Abnormal weight loss: Secondary | ICD-10-CM | POA: Diagnosis not present

## 2020-11-26 DIAGNOSIS — Z87311 Personal history of (healed) other pathological fracture: Secondary | ICD-10-CM | POA: Insufficient documentation

## 2020-11-26 DIAGNOSIS — R5383 Other fatigue: Secondary | ICD-10-CM | POA: Insufficient documentation

## 2020-11-26 DIAGNOSIS — F419 Anxiety disorder, unspecified: Secondary | ICD-10-CM | POA: Insufficient documentation

## 2020-11-26 DIAGNOSIS — E785 Hyperlipidemia, unspecified: Secondary | ICD-10-CM | POA: Diagnosis not present

## 2020-11-26 DIAGNOSIS — Z79899 Other long term (current) drug therapy: Secondary | ICD-10-CM | POA: Insufficient documentation

## 2020-11-26 DIAGNOSIS — D473 Essential (hemorrhagic) thrombocythemia: Secondary | ICD-10-CM | POA: Diagnosis not present

## 2020-11-26 DIAGNOSIS — M1990 Unspecified osteoarthritis, unspecified site: Secondary | ICD-10-CM | POA: Insufficient documentation

## 2020-11-26 DIAGNOSIS — I10 Essential (primary) hypertension: Secondary | ICD-10-CM | POA: Diagnosis not present

## 2020-11-26 DIAGNOSIS — R5381 Other malaise: Secondary | ICD-10-CM | POA: Diagnosis not present

## 2020-11-26 NOTE — Addendum Note (Signed)
Addended by: Donetta Potts on: 11/26/2020 04:30 PM   Modules accepted: Orders

## 2020-11-27 DIAGNOSIS — M79675 Pain in left toe(s): Secondary | ICD-10-CM | POA: Diagnosis not present

## 2020-11-27 DIAGNOSIS — B351 Tinea unguium: Secondary | ICD-10-CM | POA: Diagnosis not present

## 2020-12-12 ENCOUNTER — Other Ambulatory Visit: Payer: Self-pay

## 2020-12-12 ENCOUNTER — Inpatient Hospital Stay (HOSPITAL_COMMUNITY): Payer: Medicare Other

## 2020-12-12 DIAGNOSIS — D473 Essential (hemorrhagic) thrombocythemia: Secondary | ICD-10-CM | POA: Diagnosis not present

## 2020-12-12 DIAGNOSIS — I4891 Unspecified atrial fibrillation: Secondary | ICD-10-CM | POA: Diagnosis not present

## 2020-12-12 DIAGNOSIS — K219 Gastro-esophageal reflux disease without esophagitis: Secondary | ICD-10-CM | POA: Diagnosis not present

## 2020-12-12 DIAGNOSIS — D72829 Elevated white blood cell count, unspecified: Secondary | ICD-10-CM

## 2020-12-12 DIAGNOSIS — Z7901 Long term (current) use of anticoagulants: Secondary | ICD-10-CM | POA: Diagnosis not present

## 2020-12-12 DIAGNOSIS — S8010XA Contusion of unspecified lower leg, initial encounter: Secondary | ICD-10-CM | POA: Diagnosis not present

## 2020-12-12 LAB — CBC WITH DIFFERENTIAL/PLATELET
Abs Immature Granulocytes: 0.03 10*3/uL (ref 0.00–0.07)
Basophils Absolute: 0 10*3/uL (ref 0.0–0.1)
Basophils Relative: 0 %
Eosinophils Absolute: 0.3 10*3/uL (ref 0.0–0.5)
Eosinophils Relative: 3 %
HCT: 39.4 % (ref 36.0–46.0)
Hemoglobin: 12.8 g/dL (ref 12.0–15.0)
Immature Granulocytes: 0 %
Lymphocytes Relative: 16 %
Lymphs Abs: 1.6 10*3/uL (ref 0.7–4.0)
MCH: 32 pg (ref 26.0–34.0)
MCHC: 32.5 g/dL (ref 30.0–36.0)
MCV: 98.5 fL (ref 80.0–100.0)
Monocytes Absolute: 0.8 10*3/uL (ref 0.1–1.0)
Monocytes Relative: 7 %
Neutro Abs: 7.6 10*3/uL (ref 1.7–7.7)
Neutrophils Relative %: 74 %
Platelets: 1023 10*3/uL (ref 150–400)
RBC: 4 MIL/uL (ref 3.87–5.11)
RDW: 17.9 % — ABNORMAL HIGH (ref 11.5–15.5)
WBC: 10.4 10*3/uL (ref 4.0–10.5)
nRBC: 0 % (ref 0.0–0.2)

## 2020-12-12 LAB — COMPREHENSIVE METABOLIC PANEL
ALT: 10 U/L (ref 0–44)
AST: 20 U/L (ref 15–41)
Albumin: 3.8 g/dL (ref 3.5–5.0)
Alkaline Phosphatase: 55 U/L (ref 38–126)
Anion gap: 8 (ref 5–15)
BUN: 35 mg/dL — ABNORMAL HIGH (ref 8–23)
CO2: 25 mmol/L (ref 22–32)
Calcium: 9 mg/dL (ref 8.9–10.3)
Chloride: 102 mmol/L (ref 98–111)
Creatinine, Ser: 1.73 mg/dL — ABNORMAL HIGH (ref 0.44–1.00)
GFR, Estimated: 27 mL/min — ABNORMAL LOW (ref >60)
Glucose, Bld: 101 mg/dL — ABNORMAL HIGH (ref 70–99)
Potassium: 4.9 mmol/L (ref 3.5–5.1)
Sodium: 135 mmol/L (ref 135–145)
Total Bilirubin: 0.4 mg/dL (ref 0.3–1.2)
Total Protein: 7.2 g/dL (ref 6.5–8.1)

## 2020-12-19 ENCOUNTER — Other Ambulatory Visit: Payer: Self-pay

## 2020-12-19 ENCOUNTER — Inpatient Hospital Stay (HOSPITAL_COMMUNITY): Payer: Medicare Other | Attending: Hematology | Admitting: Hematology

## 2020-12-19 VITALS — BP 108/61 | HR 64 | Temp 96.8°F | Resp 16 | Wt 99.5 lb

## 2020-12-19 DIAGNOSIS — Z87891 Personal history of nicotine dependence: Secondary | ICD-10-CM | POA: Diagnosis not present

## 2020-12-19 DIAGNOSIS — E785 Hyperlipidemia, unspecified: Secondary | ICD-10-CM | POA: Insufficient documentation

## 2020-12-19 DIAGNOSIS — Z7901 Long term (current) use of anticoagulants: Secondary | ICD-10-CM | POA: Diagnosis not present

## 2020-12-19 DIAGNOSIS — M1990 Unspecified osteoarthritis, unspecified site: Secondary | ICD-10-CM | POA: Diagnosis not present

## 2020-12-19 DIAGNOSIS — R630 Anorexia: Secondary | ICD-10-CM | POA: Diagnosis not present

## 2020-12-19 DIAGNOSIS — I4891 Unspecified atrial fibrillation: Secondary | ICD-10-CM | POA: Diagnosis not present

## 2020-12-19 DIAGNOSIS — K219 Gastro-esophageal reflux disease without esophagitis: Secondary | ICD-10-CM | POA: Insufficient documentation

## 2020-12-19 DIAGNOSIS — R634 Abnormal weight loss: Secondary | ICD-10-CM | POA: Insufficient documentation

## 2020-12-19 DIAGNOSIS — I129 Hypertensive chronic kidney disease with stage 1 through stage 4 chronic kidney disease, or unspecified chronic kidney disease: Secondary | ICD-10-CM | POA: Diagnosis not present

## 2020-12-19 DIAGNOSIS — D75839 Thrombocytosis, unspecified: Secondary | ICD-10-CM | POA: Diagnosis not present

## 2020-12-19 DIAGNOSIS — Z79899 Other long term (current) drug therapy: Secondary | ICD-10-CM | POA: Diagnosis not present

## 2020-12-19 DIAGNOSIS — D473 Essential (hemorrhagic) thrombocythemia: Secondary | ICD-10-CM | POA: Insufficient documentation

## 2020-12-19 DIAGNOSIS — E039 Hypothyroidism, unspecified: Secondary | ICD-10-CM | POA: Diagnosis not present

## 2020-12-19 DIAGNOSIS — L299 Pruritus, unspecified: Secondary | ICD-10-CM | POA: Insufficient documentation

## 2020-12-19 DIAGNOSIS — F418 Other specified anxiety disorders: Secondary | ICD-10-CM | POA: Diagnosis not present

## 2020-12-19 DIAGNOSIS — N189 Chronic kidney disease, unspecified: Secondary | ICD-10-CM | POA: Insufficient documentation

## 2020-12-19 DIAGNOSIS — R5383 Other fatigue: Secondary | ICD-10-CM | POA: Insufficient documentation

## 2020-12-19 MED ORDER — PROCHLORPERAZINE MALEATE 5 MG PO TABS: 5.0000 mg | ORAL_TABLET | Freq: Four times a day (QID) | ORAL | 3 refills | Status: DC | PRN

## 2020-12-19 MED ORDER — HYDROXYUREA 500 MG PO CAPS: 500.0000 mg | ORAL_CAPSULE | Freq: Every day | ORAL | 6 refills | Status: DC

## 2020-12-19 NOTE — Patient Instructions (Signed)
[place] at [facility] Discharge Instructions  You were seen today by Dr. Delton Coombes. He went over your recent results; your blood work shows that you have a blood condition called JAK-2 mutation which is causing your bone marrow to overproduce platelets. Elevated platelet counts can lead to more clotting or excessive bleeding. Possible treatment includes taking Hydrea at bedtime to bring down the platelet count. If you develop nausea while taking Hydrea, take Compazine (prochlorperazine) by mouth before taking Hydrea. Dr. Delton Coombes will see you back in 4 weeks for labs and follow up.   Thank you for choosing [place] at [facility] to provide your oncology and hematology care.  To afford each patient quality time with our provider, please arrive at least 15 minutes before your scheduled appointment time.   If you have a lab appointment with the [facility] please come in thru the Main Entrance and check in at the main information desk  You need to re-schedule your appointment should you arrive 10 or more minutes late.  We strive to give you quality time with our providers, and arriving late affects you and other patients whose appointments are after yours.  Also, if you no show three or more times for appointments you may be dismissed from the clinic at the providers discretion.     Again, thank you for choosing [facility] .  Our hope is that these requests will decrease the amount of time that you wait before being seen by our physicians.       _____________________________________________________________  Should you have questions after your visit to [facility], please contact our office at [phone] between the hours of 8:00 a.m. and 4:30 p.m.  Voicemails left after 4:00 p.m. will not be returned until the following business day.  For prescription refill requests, have your pharmacy contact our office and allow 72  hours.    Cancer Center Support Programs:   > Cancer Support Group  2nd Tuesday of the month 1pm-2pm, Journey Room

## 2020-12-19 NOTE — Progress Notes (Signed)
Leslie Rosario, [place] [address]   CLINIC:  Medical Oncology/Hematology  PCP:  Celene Squibb, MD [address] [place] [address]  [phone]  REASON FOR VISIT:  Follow-up for essential thrombocythemia  PRIOR THERAPY: None  CURRENT THERAPY: To begin Hydrea QD  INTERVAL HISTORY:  Leslie Rosario, a 87 y.o. female, returns for routine follow-up for her essential thrombocythema. Leslie Rosario was last seen by Dr. Arletha Pili Iruku on 11/26/2020.  Today she is accompanied by her son and she reports feeling okay. She denies having a history of lupus or RA. She complains of having right flank and back pain which she supposes is due to her sitting in the wheelchair for long periods of time. She denies having any aquagenic pruritis, nosebleeds, hematuria or hematochezia. She denies having any F/C or night sweats.  She currently lives at [facility] where she has been since December 2020. She went to [facility] after she hurt her back.   REVIEW OF SYSTEMS:  Review of Systems  Constitutional: Positive for appetite change (50%) and fatigue (depleted). Negative for chills, diaphoresis and fever.  HENT:   Negative for nosebleeds.   Gastrointestinal: Positive for abdominal pain (mild R flank pain). Negative for blood in stool.  Genitourinary: Negative for hematuria.   Musculoskeletal: Positive for back pain.  Skin: Negative for itching.  All other systems reviewed and are negative.   PAST MEDICAL/SURGICAL HISTORY:  Past Medical History:  Diagnosis Date  . Anxiety   . Arthritis   . Atrial fibrillation ([place])   . Constipation   . Depression   . Dyspnea   . Dysrhythmia    A-Fib  . GERD (gastroesophageal reflux disease)   . Hyperlipidemia   . Hypertension   . Hypothyroidism   . Nausea & vomiting   . T12 compression fracture ([place]) 06/2017  . Urinary frequency    Past Surgical History:  Procedure Laterality Date  . COLONOSCOPY    . KYPHOPLASTY N/A  06/30/2018   Procedure: KYPHOPLASTY L1 and T12;  Surgeon: Melina Schools, MD;  Location: [facility];  Service: Orthopedics;  Laterality: N/A;  90 mins  . NM MYOCAR PERF WALL MOTION  04/2006   dipyridamole; normal pattern of perfusion to all regions, post-stress EF 73%  . TONSILLECTOMY    . TRANSTHORACIC ECHOCARDIOGRAM  04/2012   EF=>55%, borderline conc LVH; LA mod dilated; RA mildly dilated; mild mitral annular calcif, mod MR; mild-mod TR with elevated RSVP 30-88mmHg, mild pulm HTN; mild calcif of AV leaflets, AV mildly sclerotic; aortic root sclerosis/calcif  . UPPER GASTROINTESTINAL ENDOSCOPY      SOCIAL HISTORY:  Social History   Socioeconomic History  . Marital status: Widowed    Spouse name: Not on file  . Number of children: 3  . Years of education: Not on file  . Highest education level: Not on file  Occupational History    Employer: RETIRED  Tobacco Use  . Smoking status: Former Smoker    Quit date: 01/10/1985    Years since quitting: 35.9  . Smokeless tobacco: Never Used  . Tobacco comment: never inhaled  Vaping Use  . Vaping Use: Never used  Substance and Sexual Activity  . Alcohol use: No    Alcohol/week: 0.0 standard drinks  . Drug use: No  . Sexual activity: Not Currently  Other Topics Concern  . Not on file  Social History Narrative  . Not on file   Social Determinants of  Health   Financial Resource Strain: Low Risk   . Difficulty of Paying Living Expenses: Not hard at all  Food Insecurity: No Food Insecurity  . Worried About Charity fundraiser in the Last Year: Never true  . Ran Out of Food in the Last Year: Never true  Transportation Needs: No Transportation Needs  . Lack of Transportation (Medical): No  . Lack of Transportation (Non-Medical): No  Physical Activity: Inactive  . Days of Exercise per Week: 0 days  . Minutes of Exercise per Session: 0 min  Stress: No Stress Concern Present  . Feeling of Stress : Not at all  Social Connections: Socially  Isolated  . Frequency of Communication with Friends and Family: More than three times a week  . Frequency of Social Gatherings with Friends and Family: Three times a week  . Attends Religious Services: Never  . Active Member of Clubs or Organizations: No  . Attends Archivist Meetings: Never  . Marital Status: Widowed  Intimate Partner Violence: Not At Risk  . Fear of Current or Ex-Partner: No  . Emotionally Abused: No  . Physically Abused: No  . Sexually Abused: No    FAMILY HISTORY:  Family History  Problem Relation Age of Onset  . Cancer Mother   . Ovarian cancer Mother   . Cancer Father   . Cancer Brother   . Emphysema Brother   . Emphysema Brother   . Pneumonia Brother   . Aneurysm Brother   . Healthy Son   . Sleep apnea Son   . Aortic aneurysm Other   . Kidney cancer Son     CURRENT MEDICATIONS:  Current Outpatient Medications  Medication Sig Dispense Refill  . DULoxetine (CYMBALTA) 20 MG capsule Take 20 mg by mouth daily.    . feeding supplement, ENSURE ENLIVE, (ENSURE ENLIVE) LIQD Take 237 mLs by mouth 2 (two) times daily between meals. (Patient taking differently: Take 237 mLs by mouth daily.)    . furosemide (LASIX) 20 MG tablet Take 20 mg by mouth daily.    . hydroxyurea (HYDREA) 500 MG capsule Take 1 capsule (500 mg total) by mouth daily. May take with food to minimize GI side effects. 30 capsule 6  . ipratropium (ATROVENT) 0.03 % nasal spray Place into both nostrils.    Marland Kitchen levothyroxine (SYNTHROID, LEVOTHROID) 75 MCG tablet Take 75 mcg by mouth daily before breakfast.    . loratadine (CLARITIN) 5 MG chewable tablet [id number]  loratadine 5 mg tablet Administer 1 tab(s) By mouth once a day    . Magnesium Oxide -Mg Supplement 250 MG TABS Take 1 tablet by mouth at bedtime.    . metoprolol (TOPROL-XL) 25 MG 24 hr tablet Take 12.5 mg by mouth at bedtime.    . metoprolol tartrate (LOPRESSOR) 25 MG tablet Take 12.5 mg by mouth daily.    . pantoprazole  (PROTONIX) 40 MG tablet Take 40 mg by mouth daily.    . polyethylene glycol (MIRALAX / GLYCOLAX) 17 g packet Take 17 g by mouth daily as needed.    . potassium chloride (MICRO-K) 10 MEQ CR capsule Take 10 mEq by mouth daily.    . prochlorperazine (COMPAZINE) 5 MG tablet Take 1 tablet (5 mg total) by mouth every 6 (six) hours as needed for nausea or vomiting. 60 tablet 3  . simvastatin (ZOCOR) 10 MG tablet Take 10 mg by mouth at bedtime.    . sucralfate (CARAFATE) 1 g tablet Take 1 g by mouth  4 (four) times daily.    Marland Kitchen warfarin (COUMADIN) 3 MG tablet Take 3 mg by mouth daily. Every Mon, Wed, Fri, Sat    . warfarin (COUMADIN) 6 MG tablet Take 6 mg by mouth. Every Tues, Thurs, Sun    . ALPRAZolam (XANAX) 0.5 MG tablet Take 1 tablet (0.5 mg total) by mouth at bedtime as needed for anxiety or sleep. (Patient not taking: Reported on 12/19/2020)  0  . bisacodyl (DULCOLAX) 10 MG suppository Place 10 mg rectally every three (3) days as needed for moderate constipation. (Patient not taking: Reported on 12/19/2020)     No current facility-administered medications for this visit.    ALLERGIES:  Allergies  Allergen Reactions  . Adhesive [Tape] Other (See Comments)    REACTION: pulls skin  . Codeine     UNKNOWN REACTION  . Prednisone     UNKNOWN  . Sulfonamide Derivatives     UNKNOWN REACTION  . Vicodin [Hydrocodone-Acetaminophen]     "Intolerance "    PHYSICAL EXAM:  Performance status (ECOG): 2 - Symptomatic, <50% confined to bed  Vitals:   12/19/20 1033  BP: 108/61  Pulse: 64  Resp: 16  Temp: (!) 96.8 F (36 C)  SpO2: 100%   Wt Readings from Last 3 Encounters:  12/19/20 99 lb 8 oz (45.1 kg)  11/26/20 99 lb 14.4 oz (45.3 kg)  10/10/19 123 lb 7.3 oz (56 kg)   Physical Exam Vitals reviewed.  Constitutional:      Appearance: Normal appearance.  Cardiovascular:     Rate and Rhythm: Normal rate and regular rhythm.     Pulses: Normal pulses.     Heart sounds: Normal heart sounds.   Pulmonary:     Effort: Pulmonary effort is normal.     Breath sounds: Normal breath sounds.  Chest:  Breasts:     Right: No axillary adenopathy or supraclavicular adenopathy.     Left: No axillary adenopathy or supraclavicular adenopathy.    Abdominal:     Palpations: Abdomen is soft. There is no mass.     Tenderness: There is no abdominal tenderness.  Lymphadenopathy:     Upper Body:     Right upper body: No supraclavicular, axillary or pectoral adenopathy.     Left upper body: No supraclavicular, axillary or pectoral adenopathy.  Neurological:     General: No focal deficit present.     Mental Status: She is alert and oriented to person, place, and time.  Psychiatric:        Mood and Affect: Mood normal.        Behavior: Behavior normal.     LABORATORY DATA:  I have reviewed the labs as listed.  CBC Latest Ref Rng & Units 12/12/2020 11/01/2020 10/11/2019  WBC 4.0 - 10.5 K/uL 10.4 12.1(H) 10.7(H)  Hemoglobin 12.0 - 15.0 g/dL 12.8 12.6 14.5  Hematocrit 36.0 - 46.0 % 39.4 39.3 45.2  Platelets 150 - 400 K/uL 1,023(HH) 977(HH) 741(H)   CMP Latest Ref Rng & Units 12/12/2020 11/01/2020 10/11/2019  Glucose 70 - 99 mg/dL 101(H) 91 118(H)  BUN 8 - 23 mg/dL 35(H) 33(H) 30(H)  Creatinine 0.44 - 1.00 mg/dL 1.73(H) 1.72(H) 0.99  Sodium 135 - 145 mmol/L 135 135 137  Potassium 3.5 - 5.1 mmol/L 4.9 4.9 4.1  Chloride 98 - 111 mmol/L 102 102 103  CO2 22 - 32 mmol/L 25 25 26   Calcium 8.9 - 10.3 mg/dL 9.0 9.0 8.9  Total Protein 6.5 - 8.1 g/dL 7.2 7.3 -  Total Bilirubin 0.3 - 1.2 mg/dL 0.4 0.6 -  Alkaline Phos 38 - 126 U/L 55 60 -  AST 15 - 41 U/L 20 21 -  ALT 0 - 44 U/L 10 13 -      Component Value Date/Time   RBC 4.00 12/12/2020 1312   MCV 98.5 12/12/2020 1312   MCV 92 08/24/2018 1537   MCH 32.0 12/12/2020 1312   MCHC 32.5 12/12/2020 1312   RDW 17.9 (H) 12/12/2020 1312   RDW 13.1 08/24/2018 1537   LYMPHSABS 1.6 12/12/2020 1312   MONOABS 0.8 12/12/2020 1312   EOSABS 0.3  12/12/2020 1312   BASOSABS 0.0 12/12/2020 1312    DIAGNOSTIC IMAGING:  I have independently reviewed the scans and discussed with the patient. No results found.   ASSESSMENT:  1.  Essential thrombocythemia JAK-2 V 13 F mutation positive: -Evaluated for thrombocytosis. -No prior history of thrombosis.  No vasomotor symptoms. -Patient resident at [facility] assisted living facility since December 2020. -Reported 20 pound weight loss since admitted to [facility] assisted living facility.  Weight has been stable in the last 4 weeks. -Mild generalized itching reported.  No aquagenic pruritus.   PLAN:  1.  Essential thrombocythemia JAK-2 V 92 F mutation: -We have reviewed the prognosis of essential thrombocythemia which is associated with increased incidence of thromboembolic events. -Recommended hydroxyurea to decrease platelet count below 400. -Discussed side effects of hydroxyurea in detail. -We will start hydroxyurea 500 mg daily. -She will use Compazine 5 mg as needed for nausea. -RTC 4 weeks to repeat labs. -Plan discussed with her son today who is accompanying her.  2.  CKD: -Creatinine stable around 1.7.  We will closely monitor.  Orders placed this encounter:  No orders of the defined types were placed in this encounter.    Derek Jack, MD [place] [phone]   I, Milinda Antis, am acting as a scribe for Dr. Sanda Linger.  I, Derek Jack MD, have reviewed the above documentation for accuracy and completeness, and I agree with the above.

## 2020-12-26 ENCOUNTER — Encounter (HOSPITAL_COMMUNITY): Payer: Self-pay | Admitting: *Deleted

## 2020-12-26 NOTE — Progress Notes (Signed)
assisted living called and said that patient started hydrea on 1/6 taking 500 mg qhs. she is now complaining of her body hurting, she is significantly fatigued and no eating. She doesn't have the energy to get up and come to cafeteria to even attempt to eat. they are concerned about her as this is a significant change in her functioning status.    Per Dr. Delton Coombes, patient is to stop taking hydrea.  If her symptoms improve, she can restart the hydrea at 1 tablet every other day.  If her symptoms do not improve, she is to be brought in for evaluation by her primary care provider.    I have faxed this order to [facility].

## 2021-01-08 DIAGNOSIS — Z7901 Long term (current) use of anticoagulants: Secondary | ICD-10-CM | POA: Diagnosis not present

## 2021-01-08 DIAGNOSIS — S8010XA Contusion of unspecified lower leg, initial encounter: Secondary | ICD-10-CM | POA: Diagnosis not present

## 2021-01-08 DIAGNOSIS — I4891 Unspecified atrial fibrillation: Secondary | ICD-10-CM | POA: Diagnosis not present

## 2021-01-08 DIAGNOSIS — K219 Gastro-esophageal reflux disease without esophagitis: Secondary | ICD-10-CM | POA: Diagnosis not present

## 2021-01-15 ENCOUNTER — Inpatient Hospital Stay (HOSPITAL_COMMUNITY): Payer: Medicare Other | Attending: Hematology

## 2021-01-15 ENCOUNTER — Encounter (HOSPITAL_COMMUNITY): Payer: Self-pay

## 2021-01-15 ENCOUNTER — Other Ambulatory Visit: Payer: Self-pay

## 2021-01-15 DIAGNOSIS — R531 Weakness: Secondary | ICD-10-CM | POA: Diagnosis not present

## 2021-01-15 DIAGNOSIS — Z79899 Other long term (current) drug therapy: Secondary | ICD-10-CM | POA: Insufficient documentation

## 2021-01-15 DIAGNOSIS — Z87891 Personal history of nicotine dependence: Secondary | ICD-10-CM | POA: Insufficient documentation

## 2021-01-15 DIAGNOSIS — L299 Pruritus, unspecified: Secondary | ICD-10-CM | POA: Insufficient documentation

## 2021-01-15 DIAGNOSIS — D473 Essential (hemorrhagic) thrombocythemia: Secondary | ICD-10-CM | POA: Diagnosis not present

## 2021-01-15 DIAGNOSIS — N189 Chronic kidney disease, unspecified: Secondary | ICD-10-CM | POA: Diagnosis not present

## 2021-01-15 LAB — CBC WITH DIFFERENTIAL/PLATELET
Abs Immature Granulocytes: 0.04 10*3/uL (ref 0.00–0.07)
Basophils Absolute: 0.1 10*3/uL (ref 0.0–0.1)
Basophils Relative: 1 %
Eosinophils Absolute: 0.4 10*3/uL (ref 0.0–0.5)
Eosinophils Relative: 4 %
HCT: 41.4 % (ref 36.0–46.0)
Hemoglobin: 13.1 g/dL (ref 12.0–15.0)
Immature Granulocytes: 0 %
Lymphocytes Relative: 13 %
Lymphs Abs: 1.4 10*3/uL (ref 0.7–4.0)
MCH: 31.6 pg (ref 26.0–34.0)
MCHC: 31.6 g/dL (ref 30.0–36.0)
MCV: 99.8 fL (ref 80.0–100.0)
Monocytes Absolute: 0.8 10*3/uL (ref 0.1–1.0)
Monocytes Relative: 8 %
Neutro Abs: 7.8 10*3/uL — ABNORMAL HIGH (ref 1.7–7.7)
Neutrophils Relative %: 74 %
Platelets: 908 10*3/uL (ref 150–400)
RBC: 4.15 MIL/uL (ref 3.87–5.11)
RDW: 17.7 % — ABNORMAL HIGH (ref 11.5–15.5)
WBC: 10.5 10*3/uL (ref 4.0–10.5)
nRBC: 0 % (ref 0.0–0.2)

## 2021-01-15 LAB — COMPREHENSIVE METABOLIC PANEL
ALT: 13 U/L (ref 0–44)
AST: 21 U/L (ref 15–41)
Albumin: 3.6 g/dL (ref 3.5–5.0)
Alkaline Phosphatase: 56 U/L (ref 38–126)
Anion gap: 8 (ref 5–15)
BUN: 37 mg/dL — ABNORMAL HIGH (ref 8–23)
CO2: 26 mmol/L (ref 22–32)
Calcium: 9.3 mg/dL (ref 8.9–10.3)
Chloride: 102 mmol/L (ref 98–111)
Creatinine, Ser: 1.88 mg/dL — ABNORMAL HIGH (ref 0.44–1.00)
GFR, Estimated: 24 mL/min — ABNORMAL LOW (ref >60)
Glucose, Bld: 129 mg/dL — ABNORMAL HIGH (ref 70–99)
Potassium: 4.8 mmol/L (ref 3.5–5.1)
Sodium: 136 mmol/L (ref 135–145)
Total Bilirubin: 0.6 mg/dL (ref 0.3–1.2)
Total Protein: 7 g/dL (ref 6.5–8.1)

## 2021-01-15 NOTE — Progress Notes (Signed)
CRITICAL VALUE STICKER  CRITICAL VALUE: Platelets 908  RECEIVER (on-site recipient of call): Nicoletta Ba., RN  DATE & TIME NOTIFIED: 01/15/2021 1510  MD NOTIFIED: Tomie China, MD  TIME OF NOTIFICATION: 01/15/2021 1513  RESPONSE: office visit scheduled for 01/21/2021, no further instructions at this time

## 2021-01-21 ENCOUNTER — Inpatient Hospital Stay (HOSPITAL_COMMUNITY): Payer: Medicare Other | Admitting: Hematology

## 2021-01-22 ENCOUNTER — Inpatient Hospital Stay (HOSPITAL_COMMUNITY): Payer: Medicare Other | Admitting: Oncology

## 2021-01-22 ENCOUNTER — Other Ambulatory Visit: Payer: Self-pay

## 2021-01-22 VITALS — BP 138/63 | HR 64 | Temp 97.0°F | Resp 16 | Wt 102.7 lb

## 2021-01-22 DIAGNOSIS — R531 Weakness: Secondary | ICD-10-CM | POA: Diagnosis not present

## 2021-01-22 DIAGNOSIS — D473 Essential (hemorrhagic) thrombocythemia: Secondary | ICD-10-CM

## 2021-01-22 DIAGNOSIS — L299 Pruritus, unspecified: Secondary | ICD-10-CM | POA: Diagnosis not present

## 2021-01-22 DIAGNOSIS — Z79899 Other long term (current) drug therapy: Secondary | ICD-10-CM | POA: Diagnosis not present

## 2021-01-22 DIAGNOSIS — N189 Chronic kidney disease, unspecified: Secondary | ICD-10-CM | POA: Diagnosis not present

## 2021-01-22 DIAGNOSIS — Z87891 Personal history of nicotine dependence: Secondary | ICD-10-CM | POA: Diagnosis not present

## 2021-01-22 NOTE — Progress Notes (Signed)
Leslie Rosario, [place] [address]   CLINIC:  Medical Oncology/Hematology  PCP:  Celene Squibb, MD [address] [place] [address]  [phone]  REASON FOR VISIT:  Follow-up for essential thrombocythemia  PRIOR THERAPY: None  CURRENT THERAPY: To begin Hydrea QD  INTERVAL HISTORY:  [name], a 87 y.o. female, returns for routine follow-up for her essential thrombocythema. Leslie Rosario was last seen in clinic on 12/19/2020.  She was started on Hydrea 500 mg daily for essential thrombocytopenia.  Patient lives at [facility] and apparently developed significant fatigue and some nausea with Hydrea 500 mg daily.  Nursing facility spoke to cancer center who recommended she switch to every other day dosing.  Today she is accompanied by her son.  She appears to be tolerating 500 mg Hydrea every other day better than daily.  Son states she had significant fatigue prior to initiation of Hydrea.  She is weak but this is a chronic problem.  She is able to walk but needs a walker.    REVIEW OF SYSTEMS:  Review of Systems  Constitutional: Positive for appetite change and fatigue.  Cardiovascular: Positive for leg swelling (left leg ).  Musculoskeletal: Positive for arthralgias and gait problem.  Neurological: Positive for gait problem.    PAST MEDICAL/SURGICAL HISTORY:  Past Medical History:  Diagnosis Date  . Anxiety   . Arthritis   . Atrial fibrillation ([place])   . Constipation   . Depression   . Dyspnea   . Dysrhythmia    A-Fib  . GERD (gastroesophageal reflux disease)   . Hyperlipidemia   . Hypertension   . Hypothyroidism   . Nausea & vomiting   . T12 compression fracture ([place]) 06/2017  . Urinary frequency    Past Surgical History:  Procedure Laterality Date  . COLONOSCOPY    . KYPHOPLASTY N/A 06/30/2018   Procedure: KYPHOPLASTY L1 and T12;  Surgeon: Melina Schools, MD;  Location: [facility];  Service: Orthopedics;  Laterality: N/A;  90  mins  . NM MYOCAR PERF WALL MOTION  04/2006   dipyridamole; normal pattern of perfusion to all regions, post-stress EF 73%  . TONSILLECTOMY    . TRANSTHORACIC ECHOCARDIOGRAM  04/2012   EF=>55%, borderline conc LVH; LA mod dilated; RA mildly dilated; mild mitral annular calcif, mod MR; mild-mod TR with elevated RSVP 30-70mmHg, mild pulm HTN; mild calcif of AV leaflets, AV mildly sclerotic; aortic root sclerosis/calcif  . UPPER GASTROINTESTINAL ENDOSCOPY      SOCIAL HISTORY:  Social History   Socioeconomic History  . Marital status: Widowed    Spouse name: Not on file  . Number of children: 3  . Years of education: Not on file  . Highest education level: Not on file  Occupational History    Employer: RETIRED  Tobacco Use  . Smoking status: Former Smoker    Quit date: 01/10/1985    Years since quitting: 36.0  . Smokeless tobacco: Never Used  . Tobacco comment: never inhaled  Vaping Use  . Vaping Use: Never used  Substance and Sexual Activity  . Alcohol use: No    Alcohol/week: 0.0 standard drinks  . Drug use: No  . Sexual activity: Not Currently  Other Topics Concern  . Not on file  Social History Narrative  . Not on file   Social Determinants of Health   Financial Resource Strain: Low Risk   . Difficulty of Paying Living Expenses: Not hard at  all  Food Insecurity: No Food Insecurity  . Worried About Charity fundraiser in the Last Year: Never true  . Ran Out of Food in the Last Year: Never true  Transportation Needs: No Transportation Needs  . Lack of Transportation (Medical): No  . Lack of Transportation (Non-Medical): No  Physical Activity: Inactive  . Days of Exercise per Week: 0 days  . Minutes of Exercise per Session: 0 min  Stress: No Stress Concern Present  . Feeling of Stress : Not at all  Social Connections: Socially Isolated  . Frequency of Communication with Friends and Family: More than three times a week  . Frequency of Social Gatherings with Friends and  Family: Three times a week  . Attends Religious Services: Never  . Active Member of Clubs or Organizations: No  . Attends Archivist Meetings: Never  . Marital Status: Widowed  Intimate Partner Violence: Not At Risk  . Fear of Current or Ex-Partner: No  . Emotionally Abused: No  . Physically Abused: No  . Sexually Abused: No    FAMILY HISTORY:  Family History  Problem Relation Age of Onset  . Cancer Mother   . Ovarian cancer Mother   . Cancer Father   . Cancer Brother   . Emphysema Brother   . Emphysema Brother   . Pneumonia Brother   . Aneurysm Brother   . Healthy Son   . Sleep apnea Son   . Aortic aneurysm Other   . Kidney cancer Son     CURRENT MEDICATIONS:  Current Outpatient Medications  Medication Sig Dispense Refill  . ALPRAZolam (XANAX) 0.5 MG tablet Take 1 tablet (0.5 mg total) by mouth at bedtime as needed for anxiety or sleep.  0  . bisacodyl (DULCOLAX) 10 MG suppository Place 10 mg rectally every three (3) days as needed for moderate constipation.    . DULoxetine (CYMBALTA) 20 MG capsule Take 20 mg by mouth daily.    . feeding supplement, ENSURE ENLIVE, (ENSURE ENLIVE) LIQD Take 237 mLs by mouth 2 (two) times daily between meals. (Patient taking differently: Take 237 mLs by mouth daily.)    . furosemide (LASIX) 20 MG tablet Take 20 mg by mouth daily.    Marland Kitchen HYDROcodone-acetaminophen (NORCO/VICODIN) 5-325 MG tablet Take 1 tablet by mouth 2 (two) times daily as needed.    . hydroxyurea (HYDREA) 500 MG capsule Take 1 capsule (500 mg total) by mouth daily. May take with food to minimize GI side effects. 30 capsule 6  . ipratropium (ATROVENT) 0.03 % nasal spray Place into both nostrils.    Marland Kitchen levothyroxine (SYNTHROID, LEVOTHROID) 75 MCG tablet Take 75 mcg by mouth daily before breakfast.    . loratadine (CLARITIN) 5 MG chewable tablet [id number]  loratadine 5 mg tablet Administer 1 tab(s) By mouth once a day    . Magnesium Oxide -Mg Supplement 250 MG TABS Take 1  tablet by mouth at bedtime.    . metoprolol (TOPROL-XL) 25 MG 24 hr tablet Take 12.5 mg by mouth at bedtime.    . metoprolol tartrate (LOPRESSOR) 25 MG tablet Take 12.5 mg by mouth daily.    . pantoprazole (PROTONIX) 40 MG tablet Take 40 mg by mouth daily.    . polyethylene glycol (MIRALAX / GLYCOLAX) 17 g packet Take 17 g by mouth daily as needed.    . potassium chloride (MICRO-K) 10 MEQ CR capsule Take 10 mEq by mouth daily.    . prochlorperazine (COMPAZINE) 5 MG tablet Take  1 tablet (5 mg total) by mouth every 6 (six) hours as needed for nausea or vomiting. 60 tablet 3  . simvastatin (ZOCOR) 10 MG tablet Take 10 mg by mouth at bedtime.    . sucralfate (CARAFATE) 1 g tablet Take 1 g by mouth 4 (four) times daily.    Marland Kitchen warfarin (COUMADIN) 3 MG tablet Take 3 mg by mouth daily. Every Mon, Wed, Fri, Sat    . warfarin (COUMADIN) 6 MG tablet Take 6 mg by mouth. Every Tues, Thurs, Sun     No current facility-administered medications for this visit.    ALLERGIES:  Allergies  Allergen Reactions  . Adhesive [Tape] Other (See Comments)    REACTION: pulls skin  . Codeine     UNKNOWN REACTION  . Prednisone     UNKNOWN  . Sulfonamide Derivatives     UNKNOWN REACTION  . Vicodin [Hydrocodone-Acetaminophen]     "Intolerance "    PHYSICAL EXAM:  Performance status (ECOG): 2 - Symptomatic, <50% confined to bed  Vitals:   01/22/21 1258  BP: 138/63  Pulse: 64  Resp: 16  Temp: (!) 97 F (36.1 C)  SpO2: 98%   Wt Readings from Last 3 Encounters:  01/22/21 102 lb 11.2 oz (46.6 kg)  12/19/20 99 lb 8 oz (45.1 kg)  11/26/20 99 lb 14.4 oz (45.3 kg)   Physical Exam Vitals reviewed.  Constitutional:      Appearance: Normal appearance.  Cardiovascular:     Rate and Rhythm: Normal rate and regular rhythm.     Pulses: Normal pulses.     Heart sounds: Normal heart sounds.  Pulmonary:     Effort: Pulmonary effort is normal.     Breath sounds: Normal breath sounds.  Chest:  Breasts:      Right: No axillary adenopathy or supraclavicular adenopathy.     Left: No axillary adenopathy or supraclavicular adenopathy.    Abdominal:     Palpations: Abdomen is soft. There is no mass.     Tenderness: There is no abdominal tenderness.  Lymphadenopathy:     Upper Body:     Right upper body: No supraclavicular, axillary or pectoral adenopathy.     Left upper body: No supraclavicular, axillary or pectoral adenopathy.  Neurological:     General: No focal deficit present.     Mental Status: She is alert and oriented to person, place, and time.  Psychiatric:        Mood and Affect: Mood normal.        Behavior: Behavior normal.     LABORATORY DATA:  I have reviewed the labs as listed.  CBC Latest Ref Rng & Units 01/15/2021 12/12/2020 11/01/2020  WBC 4.0 - 10.5 K/uL 10.5 10.4 12.1(H)  Hemoglobin 12.0 - 15.0 g/dL 13.1 12.8 12.6  Hematocrit 36.0 - 46.0 % 41.4 39.4 39.3  Platelets 150 - 400 K/uL 908(HH) 1,023(HH) 977(HH)   CMP Latest Ref Rng & Units 01/15/2021 12/12/2020 11/01/2020  Glucose 70 - 99 mg/dL 129(H) 101(H) 91  BUN 8 - 23 mg/dL 37(H) 35(H) 33(H)  Creatinine 0.44 - 1.00 mg/dL 1.88(H) 1.73(H) 1.72(H)  Sodium 135 - 145 mmol/L 136 135 135  Potassium 3.5 - 5.1 mmol/L 4.8 4.9 4.9  Chloride 98 - 111 mmol/L 102 102 102  CO2 22 - 32 mmol/L 26 25 25   Calcium 8.9 - 10.3 mg/dL 9.3 9.0 9.0  Total Protein 6.5 - 8.1 g/dL 7.0 7.2 7.3  Total Bilirubin 0.3 - 1.2 mg/dL 0.6 0.4 0.6  Alkaline Phos 38 - 126 U/L 56 55 60  AST 15 - 41 U/L 21 20 21   ALT 0 - 44 U/L 13 10 13       Component Value Date/Time   RBC 4.15 01/15/2021 1410   MCV 99.8 01/15/2021 1410   MCV 92 08/24/2018 1537   MCH 31.6 01/15/2021 1410   MCHC 31.6 01/15/2021 1410   RDW 17.7 (H) 01/15/2021 1410   RDW 13.1 08/24/2018 1537   LYMPHSABS 1.4 01/15/2021 1410   MONOABS 0.8 01/15/2021 1410   EOSABS 0.4 01/15/2021 1410   BASOSABS 0.1 01/15/2021 1410    DIAGNOSTIC IMAGING:  I have independently reviewed the scans and  discussed with the patient. No results found.   ASSESSMENT:  1.  Essential thrombocythemia JAK-2 V 56 F mutation positive: -Evaluated for thrombocytosis. -No prior history of thrombosis.  No vasomotor symptoms. -Patient resident at [facility] assisted living facility since December 2020. -Reported 20 pound weight loss since admitted to [facility] assisted living facility.  Weight has been stable in the last 4 weeks. -Mild generalized itching reported.  No aquagenic pruritus.   PLAN:  1.  Essential thrombocythemia JAK-2 V 617 F mutation: -We have reviewed the prognosis of essential thrombocythemia which is associated with increased incidence of thromboembolic events. -Initially started on hydroxyurea 500 mg daily.  Developed nausea and severe fatigue.  -[facility] called and spoke to Dr. Delton Coombes who recommended decreasing hydroxyurea to every other day dosing. -She is currently taking hydroxyurea 500 mg every other day with better tolerance. -If she continues to have significant symptoms from hydroxyurea, patient to call clinic and we will stop the medication altogether. -Labs from 01/15/2021 show a platelet count of 908 (1023).  -She denies any hyperviscosity symptoms such as headaches, dizziness, seizure activity or change in vision.  2.  CKD: -Creatinine stable around 1.88.  We will closely monitor.  Disposition: -Continue hydroxyurea 500 mg every other day.  -RTC in 3 months for repeat labs and assessment.  Orders placed this encounter:  No orders of the defined types were placed in this encounter.  Faythe Casa, NP 01/22/2021 3:44 PM  [place] [phone]

## 2021-01-23 ENCOUNTER — Telehealth: Payer: Self-pay | Admitting: *Deleted

## 2021-01-23 NOTE — Telephone Encounter (Signed)
Pt son Richardson Landry said his mother has had all covid vaccine including the booster. Pt lives at [facility]

## 2021-01-30 DIAGNOSIS — K219 Gastro-esophageal reflux disease without esophagitis: Secondary | ICD-10-CM | POA: Diagnosis not present

## 2021-01-30 DIAGNOSIS — Z7901 Long term (current) use of anticoagulants: Secondary | ICD-10-CM | POA: Diagnosis not present

## 2021-01-30 DIAGNOSIS — I4891 Unspecified atrial fibrillation: Secondary | ICD-10-CM | POA: Diagnosis not present

## 2021-01-30 DIAGNOSIS — S8010XA Contusion of unspecified lower leg, initial encounter: Secondary | ICD-10-CM | POA: Diagnosis not present

## 2021-02-12 ENCOUNTER — Other Ambulatory Visit (HOSPITAL_COMMUNITY): Payer: Self-pay | Admitting: Surgery

## 2021-02-12 DIAGNOSIS — B351 Tinea unguium: Secondary | ICD-10-CM | POA: Diagnosis not present

## 2021-02-12 DIAGNOSIS — M79675 Pain in left toe(s): Secondary | ICD-10-CM | POA: Diagnosis not present

## 2021-02-12 DIAGNOSIS — D72829 Elevated white blood cell count, unspecified: Secondary | ICD-10-CM

## 2021-02-12 DIAGNOSIS — D473 Essential (hemorrhagic) thrombocythemia: Secondary | ICD-10-CM

## 2021-02-12 DIAGNOSIS — D72825 Bandemia: Secondary | ICD-10-CM

## 2021-02-12 DIAGNOSIS — D75839 Thrombocytosis, unspecified: Secondary | ICD-10-CM

## 2021-02-12 DIAGNOSIS — M79674 Pain in right toe(s): Secondary | ICD-10-CM | POA: Diagnosis not present

## 2021-02-13 ENCOUNTER — Inpatient Hospital Stay (HOSPITAL_COMMUNITY): Payer: Medicare Other | Attending: Hematology

## 2021-02-13 ENCOUNTER — Other Ambulatory Visit: Payer: Self-pay

## 2021-02-13 DIAGNOSIS — E785 Hyperlipidemia, unspecified: Secondary | ICD-10-CM | POA: Diagnosis not present

## 2021-02-13 DIAGNOSIS — E039 Hypothyroidism, unspecified: Secondary | ICD-10-CM | POA: Diagnosis not present

## 2021-02-13 DIAGNOSIS — I4891 Unspecified atrial fibrillation: Secondary | ICD-10-CM | POA: Insufficient documentation

## 2021-02-13 DIAGNOSIS — D75839 Thrombocytosis, unspecified: Secondary | ICD-10-CM | POA: Diagnosis not present

## 2021-02-13 DIAGNOSIS — I10 Essential (primary) hypertension: Secondary | ICD-10-CM | POA: Diagnosis not present

## 2021-02-13 DIAGNOSIS — D72829 Elevated white blood cell count, unspecified: Secondary | ICD-10-CM

## 2021-02-13 DIAGNOSIS — Z79899 Other long term (current) drug therapy: Secondary | ICD-10-CM | POA: Diagnosis not present

## 2021-02-13 DIAGNOSIS — K219 Gastro-esophageal reflux disease without esophagitis: Secondary | ICD-10-CM | POA: Diagnosis not present

## 2021-02-13 DIAGNOSIS — M1990 Unspecified osteoarthritis, unspecified site: Secondary | ICD-10-CM | POA: Diagnosis not present

## 2021-02-13 DIAGNOSIS — Z87891 Personal history of nicotine dependence: Secondary | ICD-10-CM | POA: Insufficient documentation

## 2021-02-13 DIAGNOSIS — Z7901 Long term (current) use of anticoagulants: Secondary | ICD-10-CM | POA: Insufficient documentation

## 2021-02-13 DIAGNOSIS — R5383 Other fatigue: Secondary | ICD-10-CM | POA: Diagnosis not present

## 2021-02-13 DIAGNOSIS — F418 Other specified anxiety disorders: Secondary | ICD-10-CM | POA: Diagnosis not present

## 2021-02-13 DIAGNOSIS — D473 Essential (hemorrhagic) thrombocythemia: Secondary | ICD-10-CM | POA: Diagnosis not present

## 2021-02-13 DIAGNOSIS — D72825 Bandemia: Secondary | ICD-10-CM

## 2021-02-13 LAB — COMPREHENSIVE METABOLIC PANEL
ALT: 13 U/L (ref 0–44)
AST: 22 U/L (ref 15–41)
Albumin: 3.6 g/dL (ref 3.5–5.0)
Alkaline Phosphatase: 55 U/L (ref 38–126)
Anion gap: 7 (ref 5–15)
BUN: 33 mg/dL — ABNORMAL HIGH (ref 8–23)
CO2: 29 mmol/L (ref 22–32)
Calcium: 8.8 mg/dL — ABNORMAL LOW (ref 8.9–10.3)
Chloride: 100 mmol/L (ref 98–111)
Creatinine, Ser: 1.69 mg/dL — ABNORMAL HIGH (ref 0.44–1.00)
GFR, Estimated: 28 mL/min — ABNORMAL LOW (ref >60)
Glucose, Bld: 86 mg/dL (ref 70–99)
Potassium: 4.7 mmol/L (ref 3.5–5.1)
Sodium: 136 mmol/L (ref 135–145)
Total Bilirubin: 0.6 mg/dL (ref 0.3–1.2)
Total Protein: 7.1 g/dL (ref 6.5–8.1)

## 2021-02-13 LAB — CBC WITH DIFFERENTIAL/PLATELET
Abs Immature Granulocytes: 0.03 10*3/uL (ref 0.00–0.07)
Basophils Absolute: 0.1 10*3/uL (ref 0.0–0.1)
Basophils Relative: 1 %
Eosinophils Absolute: 0.3 10*3/uL (ref 0.0–0.5)
Eosinophils Relative: 4 %
HCT: 40.7 % (ref 36.0–46.0)
Hemoglobin: 12.9 g/dL (ref 12.0–15.0)
Immature Granulocytes: 0 %
Lymphocytes Relative: 16 %
Lymphs Abs: 1.3 10*3/uL (ref 0.7–4.0)
MCH: 31.4 pg (ref 26.0–34.0)
MCHC: 31.7 g/dL (ref 30.0–36.0)
MCV: 99 fL (ref 80.0–100.0)
Monocytes Absolute: 0.5 10*3/uL (ref 0.1–1.0)
Monocytes Relative: 6 %
Neutro Abs: 6.1 10*3/uL (ref 1.7–7.7)
Neutrophils Relative %: 73 %
Platelets: 828 10*3/uL — ABNORMAL HIGH (ref 150–400)
RBC: 4.11 MIL/uL (ref 3.87–5.11)
RDW: 18.6 % — ABNORMAL HIGH (ref 11.5–15.5)
WBC: 8.2 10*3/uL (ref 4.0–10.5)
nRBC: 0 % (ref 0.0–0.2)

## 2021-02-20 ENCOUNTER — Inpatient Hospital Stay (HOSPITAL_COMMUNITY): Payer: Medicare Other | Admitting: Hematology

## 2021-02-20 ENCOUNTER — Other Ambulatory Visit: Payer: Self-pay

## 2021-02-20 VITALS — BP 122/52 | HR 84 | Temp 97.0°F | Resp 16 | Wt 103.1 lb

## 2021-02-20 DIAGNOSIS — D75839 Thrombocytosis, unspecified: Secondary | ICD-10-CM | POA: Diagnosis not present

## 2021-02-20 DIAGNOSIS — I10 Essential (primary) hypertension: Secondary | ICD-10-CM | POA: Diagnosis not present

## 2021-02-20 DIAGNOSIS — R5383 Other fatigue: Secondary | ICD-10-CM | POA: Diagnosis not present

## 2021-02-20 DIAGNOSIS — F418 Other specified anxiety disorders: Secondary | ICD-10-CM | POA: Diagnosis not present

## 2021-02-20 DIAGNOSIS — I4891 Unspecified atrial fibrillation: Secondary | ICD-10-CM | POA: Diagnosis not present

## 2021-02-20 DIAGNOSIS — D473 Essential (hemorrhagic) thrombocythemia: Secondary | ICD-10-CM

## 2021-02-20 DIAGNOSIS — Z7901 Long term (current) use of anticoagulants: Secondary | ICD-10-CM | POA: Diagnosis not present

## 2021-02-20 DIAGNOSIS — E785 Hyperlipidemia, unspecified: Secondary | ICD-10-CM | POA: Diagnosis not present

## 2021-02-20 DIAGNOSIS — Z87891 Personal history of nicotine dependence: Secondary | ICD-10-CM | POA: Diagnosis not present

## 2021-02-20 DIAGNOSIS — Z79899 Other long term (current) drug therapy: Secondary | ICD-10-CM | POA: Diagnosis not present

## 2021-02-20 DIAGNOSIS — E039 Hypothyroidism, unspecified: Secondary | ICD-10-CM | POA: Diagnosis not present

## 2021-02-20 DIAGNOSIS — M1990 Unspecified osteoarthritis, unspecified site: Secondary | ICD-10-CM | POA: Diagnosis not present

## 2021-02-20 DIAGNOSIS — K219 Gastro-esophageal reflux disease without esophagitis: Secondary | ICD-10-CM | POA: Diagnosis not present

## 2021-02-20 MED ORDER — HYDROXYUREA 500 MG PO CAPS: 500.0000 mg | ORAL_CAPSULE | ORAL | 6 refills | Status: DC

## 2021-02-20 NOTE — Progress Notes (Signed)
Leslie Rosario, [place] [id number]   CLINIC:  Medical Oncology/Hematology  PCP:  Celene Squibb, MD [address] Liana Crocker [place] [address]  [phone]  REASON FOR VISIT:  Follow-up for essential thrombocytosis  PRIOR THERAPY: None  CURRENT THERAPY: Hydroxyurea  INTERVAL HISTORY:  [name], a 87 y.o. female, returns for routine follow-up for her JAK2 positive essential thrombocytosis. Girtie was last seen on 01/22/2021.  She was started on hydroxyurea 500 mg daily in December 2021, but the dose was later decreased to 500 mg every other day due to significant side effects (extreme fatigue, nausea and vomiting).  She has been tolerating this dose much better.  She still has intermittent fatigue, but per her son who is present at bedside, her energy level is much better now that her dose was decreased.  He reports that nausea and vomiting have resolved.  No thrombotic events, aquagenic pruritus, erythromelalgia, or changes in the color of her fingertips.  She denies significant bleeding events, she is on warfarin for atrial fibrillation.  No weight loss, fever, chills, night sweats.  Denies skin sores, new rash, vision changes, headache, dizziness, seizures.   REVIEW OF SYSTEMS:  Review of Systems  Constitutional: Positive for fatigue. Negative for appetite change, chills, diaphoresis, fever and unexpected weight change.  HENT:   Negative for mouth sores and nosebleeds.   Eyes: Negative for eye problems.  Respiratory: Negative for chest tightness, cough and shortness of breath.   Gastrointestinal: Positive for abdominal pain (RLQ abdominal pain, chronic). Negative for blood in stool, constipation, diarrhea, nausea and vomiting.  Musculoskeletal: Positive for back pain (Chronic).  Skin: Negative for itching, rash and wound.  Neurological: Negative for dizziness, headaches, numbness and seizures.  Hematological: Does not bruise/bleed easily.     PAST MEDICAL/SURGICAL HISTORY:  Past Medical History:  Diagnosis Date  . Anxiety   . Arthritis   . Atrial fibrillation ([facility])   . Constipation   . Depression   . Dyspnea   . Dysrhythmia    A-Fib  . GERD (gastroesophageal reflux disease)   . Hyperlipidemia   . Hypertension   . Hypothyroidism   . Nausea & vomiting   . T12 compression fracture ([facility]) 06/2017  . Urinary frequency    Past Surgical History:  Procedure Laterality Date  . COLONOSCOPY    . KYPHOPLASTY N/A 06/30/2018   Procedure: KYPHOPLASTY L1 and T12;  Surgeon: Melina Schools, MD;  Location: [facility];  Service: Orthopedics;  Laterality: N/A;  90 mins  . NM MYOCAR PERF WALL MOTION  04/2006   dipyridamole; normal pattern of perfusion to all regions, post-stress EF 73%  . TONSILLECTOMY    . TRANSTHORACIC ECHOCARDIOGRAM  04/2012   EF=>55%, borderline conc LVH; LA mod dilated; RA mildly dilated; mild mitral annular calcif, mod MR; mild-mod TR with elevated RSVP 30-19mmHg, mild pulm HTN; mild calcif of AV leaflets, AV mildly sclerotic; aortic root sclerosis/calcif  . UPPER GASTROINTESTINAL ENDOSCOPY      SOCIAL HISTORY:  Social History   Socioeconomic History  . Marital status: Widowed    Spouse name: Not on file  . Number of children: 3  . Years of education: Not on file  . Highest education level: Not on file  Occupational History    Employer: RETIRED  Tobacco Use  . Smoking status: Former Smoker    Quit date: 01/10/1985    Years since quitting: 36.1  . Smokeless tobacco: Never Used  .  Tobacco comment: never inhaled  Vaping Use  . Vaping Use: Never used  Substance and Sexual Activity  . Alcohol use: No    Alcohol/week: 0.0 standard drinks  . Drug use: No  . Sexual activity: Not Currently  Other Topics Concern  . Not on file  Social History Narrative  . Not on file   Social Determinants of Health   Financial Resource Strain: Low Risk   . Difficulty of Paying Living Expenses: Not hard at all  Food  Insecurity: No Food Insecurity  . Worried About Charity fundraiser in the Last Year: Never true  . Ran Out of Food in the Last Year: Never true  Transportation Needs: No Transportation Needs  . Lack of Transportation (Medical): No  . Lack of Transportation (Non-Medical): No  Physical Activity: Inactive  . Days of Exercise per Week: 0 days  . Minutes of Exercise per Session: 0 min  Stress: No Stress Concern Present  . Feeling of Stress : Not at all  Social Connections: Socially Isolated  . Frequency of Communication with Friends and Family: More than three times a week  . Frequency of Social Gatherings with Friends and Family: Three times a week  . Attends Religious Services: Never  . Active Member of Clubs or Organizations: No  . Attends Archivist Meetings: Never  . Marital Status: Widowed  Intimate Partner Violence: Not At Risk  . Fear of Current or Ex-Partner: No  . Emotionally Abused: No  . Physically Abused: No  . Sexually Abused: No    FAMILY HISTORY:  Family History  Problem Relation Age of Onset  . Cancer Mother   . Ovarian cancer Mother   . Cancer Father   . Cancer Brother   . Emphysema Brother   . Emphysema Brother   . Pneumonia Brother   . Aneurysm Brother   . Healthy Son   . Sleep apnea Son   . Aortic aneurysm Other   . Kidney cancer Son     CURRENT MEDICATIONS:  Current Outpatient Medications  Medication Sig Dispense Refill  . ALPRAZolam (XANAX) 0.5 MG tablet Take 1 tablet (0.5 mg total) by mouth at bedtime as needed for anxiety or sleep.  0  . bisacodyl (DULCOLAX) 10 MG suppository Place 10 mg rectally every three (3) days as needed for moderate constipation.    . DULoxetine (CYMBALTA) 20 MG capsule Take 20 mg by mouth daily.    . feeding supplement, ENSURE ENLIVE, (ENSURE ENLIVE) LIQD Take 237 mLs by mouth 2 (two) times daily between meals. (Patient taking differently: Take 237 mLs by mouth daily.)    . furosemide (LASIX) 20 MG tablet Take 20  mg by mouth daily.    Marland Kitchen HYDROcodone-acetaminophen (NORCO/VICODIN) 5-325 MG tablet Take 1 tablet by mouth 2 (two) times daily as needed.    Marland Kitchen ipratropium (ATROVENT) 0.03 % nasal spray Place into both nostrils.    Marland Kitchen levothyroxine (SYNTHROID, LEVOTHROID) 75 MCG tablet Take 75 mcg by mouth daily before breakfast.    . loratadine (CLARITIN) 5 MG chewable tablet [id number]  loratadine 5 mg tablet Administer 1 tab(s) By mouth once a day    . Magnesium Oxide -Mg Supplement 250 MG TABS Take 1 tablet by mouth at bedtime.    . metoprolol (TOPROL-XL) 25 MG 24 hr tablet Take 12.5 mg by mouth at bedtime.    . metoprolol tartrate (LOPRESSOR) 25 MG tablet Take 12.5 mg by mouth daily.    . pantoprazole (PROTONIX) 40  MG tablet Take 40 mg by mouth daily.    . polyethylene glycol (MIRALAX / GLYCOLAX) 17 g packet Take 17 g by mouth daily as needed.    . potassium chloride (MICRO-K) 10 MEQ CR capsule Take 10 mEq by mouth daily.    . prochlorperazine (COMPAZINE) 5 MG tablet Take 1 tablet (5 mg total) by mouth every 6 (six) hours as needed for nausea or vomiting. 60 tablet 3  . simvastatin (ZOCOR) 10 MG tablet Take 10 mg by mouth at bedtime.    . sucralfate (CARAFATE) 1 g tablet Take 1 g by mouth 4 (four) times daily.    Marland Kitchen warfarin (COUMADIN) 3 MG tablet Take 3 mg by mouth daily. Every Mon, Wed, Fri, Sat    . warfarin (COUMADIN) 6 MG tablet Take 6 mg by mouth. Every Tues, Thurs, Sun    . hydroxyurea (HYDREA) 500 MG capsule Take 1 capsule (500 mg total) by mouth every other day. May take with food to minimize GI side effects. 30 capsule 6   No current facility-administered medications for this visit.    ALLERGIES:  Allergies  Allergen Reactions  . Adhesive [Tape] Other (See Comments)    REACTION: pulls skin  . Codeine     UNKNOWN REACTION  . Prednisone     UNKNOWN  . Sulfonamide Derivatives     UNKNOWN REACTION  . Vicodin [Hydrocodone-Acetaminophen]     "Intolerance "    PHYSICAL EXAM:  Performance status  (ECOG): 2 - Symptomatic, <50% confined to bed  Vitals:   02/20/21 1155  BP: (!) 122/52  Pulse: 84  Resp: 16  Temp: (!) 97 F (36.1 C)  SpO2: 99%   Wt Readings from Last 3 Encounters:  02/20/21 103 lb 1.6 oz (46.8 kg)  01/22/21 102 lb 11.2 oz (46.6 kg)  12/19/20 99 lb 8 oz (45.1 kg)   Physical Exam Constitutional:      General: She is awake.     Appearance: She is underweight.  HENT:     Mouth/Throat:     Mouth: Mucous membranes are moist.  Eyes:     Extraocular Movements: Extraocular movements intact.     Pupils: Pupils are equal, round, and reactive to light.  Cardiovascular:     Rate and Rhythm: Normal rate. Rhythm irregular.     Pulses: Normal pulses.     Heart sounds: Normal heart sounds.  Pulmonary:     Effort: Pulmonary effort is normal.     Breath sounds: Normal breath sounds.  Abdominal:     General: Bowel sounds are normal.     Palpations: Abdomen is soft.     Tenderness: There is no abdominal tenderness.  Musculoskeletal:        General: No swelling.     Right lower leg: No edema.     Left lower leg: No edema.     Comments: Varicose veins of bilateral ankles  Skin:    Findings: No lesion or rash.  Neurological:     General: No focal deficit present.     Mental Status: She is alert. Mental status is at baseline.     LABORATORY DATA:  I have reviewed the labs as listed.  CBC Latest Ref Rng & Units 02/13/2021 01/15/2021 12/12/2020  WBC 4.0 - 10.5 K/uL 8.2 10.5 10.4  Hemoglobin 12.0 - 15.0 g/dL 12.9 13.1 12.8  Hematocrit 36.0 - 46.0 % 40.7 41.4 39.4  Platelets 150 - 400 K/uL 828(H) 908(HH) 1,023(HH)   CMP Latest Ref  Rng & Units 02/13/2021 01/15/2021 12/12/2020  Glucose 70 - 99 mg/dL 86 129(H) 101(H)  BUN 8 - 23 mg/dL 33(H) 37(H) 35(H)  Creatinine 0.44 - 1.00 mg/dL 1.69(H) 1.88(H) 1.73(H)  Sodium 135 - 145 mmol/L 136 136 135  Potassium 3.5 - 5.1 mmol/L 4.7 4.8 4.9  Chloride 98 - 111 mmol/L 100 102 102  CO2 22 - 32 mmol/L 29 26 25   Calcium 8.9 - 10.3  mg/dL 8.8(L) 9.3 9.0  Total Protein 6.5 - 8.1 g/dL 7.1 7.0 7.2  Total Bilirubin 0.3 - 1.2 mg/dL 0.6 0.6 0.4  Alkaline Phos 38 - 126 U/L 55 56 55  AST 15 - 41 U/L 22 21 20   ALT 0 - 44 U/L 13 13 10       Component Value Date/Time   RBC 4.11 02/13/2021 1046   MCV 99.0 02/13/2021 1046   MCV 92 08/24/2018 1537   MCH 31.4 02/13/2021 1046   MCHC 31.7 02/13/2021 1046   RDW 18.6 (H) 02/13/2021 1046   RDW 13.1 08/24/2018 1537   LYMPHSABS 1.3 02/13/2021 1046   MONOABS 0.5 02/13/2021 1046   EOSABS 0.3 02/13/2021 1046   BASOSABS 0.1 02/13/2021 1046    DIAGNOSTIC IMAGING:  I have independently reviewed the scans and discussed with the patient. No results found.   ASSESSMENT:  1.  JAK2 V617F positive essential thrombocytosis -Diagnosed in December 2021 -Unable to tolerate 500 mg hydroxyurea daily due to extreme fatigue, nausea, vomiting -Currently on 500 mg hydroxyurea every other day with better tolerance-labs from 02/13/2021 do show platelet count of 828, slightly decreased from previously -No history of thrombotic events   PLAN:  1.  JAK2 V617F positive essential thrombocytosis -Continue hydroxyurea 500 mg every other day -Call clinic if any worsening side effects as medication may need to be discontinued if not tolerated -No aspirin, as patient is already on warfarin for atrial fibrillation -Due to extreme thrombocytosis, will check von Willebrand -Return to clinic in 8 weeks for repeat labs follow-up  Orders placed this encounter:  Orders Placed This Encounter  Procedures  . CBC with Differential  . Comprehensive metabolic panel  . [place], MD [place] [phone]   I, Tarri Abernethy PA-C, have seen this patient in conjunction with Dr. Derek Jack. Greater than 50% of visit was performed by Dr. Delton Coombes.

## 2021-02-20 NOTE — Patient Instructions (Signed)
[place] at [facility] Discharge Instructions  You were seen today by Tarri Abernethy PA-C and Dr. Delton Coombes for ongoing monitoring and treatment of your essential thrombocytosis (elevated platelets).  Continue to take your hydoxyurea 500 mg every other day. Return to clinic for repeat labs and follow up appointment in 8 weeks.  Contact us if you develop any blood clots, or new side effects related to your hydroxyurea (such as severe fatigue, nausea/vomiting, new rash, or non-healing wounds.)  Thank you for choosing [place] at [facility] to provide your oncology and hematology care.  To afford each patient quality time with our provider, please arrive at least 15 minutes before your scheduled appointment time.   If you have a lab appointment with the [facility] please come in thru the Main Entrance and check in at the main information desk.  You need to re-schedule your appointment should you arrive 10 or more minutes late.  We strive to give you quality time with our providers, and arriving late affects you and other patients whose appointments are after yours.  Also, if you no show three or more times for appointments you may be dismissed from the clinic at the providers discretion.     Again, thank you for choosing [facility].  Our hope is that these requests will decrease the amount of time that you wait before being seen by our physicians.       _____________________________________________________________  Should you have questions after your visit to [facility], please contact our office at [phone] and follow the prompts.  Our office hours are 8:00 a.m. and 4:30 p.m. Monday - Friday.  Please note that voicemails left after 4:00 p.m. may not be returned until the following business day.  We are closed weekends and major holidays.  You do have access to a nurse 24-7, just call the main number to the  clinic [phone] and do not press any options, hold on the line and a nurse will answer the phone.    For prescription refill requests, have your pharmacy contact our office and allow 72 hours.    Due to Covid, you will need to wear a mask upon entering the hospital. If you do not have a mask, a mask will be given to you at the Main Entrance upon arrival. For doctor visits, patients may have 1 support person age 85 or older with them. For treatment visits, patients can not have anyone with them due to social distancing guidelines and our immunocompromised population.

## 2021-02-27 DIAGNOSIS — I4891 Unspecified atrial fibrillation: Secondary | ICD-10-CM | POA: Diagnosis not present

## 2021-02-27 DIAGNOSIS — K219 Gastro-esophageal reflux disease without esophagitis: Secondary | ICD-10-CM | POA: Diagnosis not present

## 2021-02-27 DIAGNOSIS — Z7901 Long term (current) use of anticoagulants: Secondary | ICD-10-CM | POA: Diagnosis not present

## 2021-02-27 DIAGNOSIS — S8010XA Contusion of unspecified lower leg, initial encounter: Secondary | ICD-10-CM | POA: Diagnosis not present

## 2021-03-26 ENCOUNTER — Telehealth (HOSPITAL_COMMUNITY): Payer: Self-pay | Admitting: Surgery

## 2021-03-26 NOTE — Telephone Encounter (Signed)
Sharyn Lull from [facility] had called yesterday and asked if the pt's hydrea could be discontinued at the request of the pt's son.  The pt's son stated that the hydrea was causing his mother to be very fatigued.  Per Dr. Delton Coombes, the hydrea can be d/c.  I called Sharyn Lull and left a message with the verbal order to d/c the hydrea.

## 2021-04-03 DIAGNOSIS — R1031 Right lower quadrant pain: Secondary | ICD-10-CM | POA: Diagnosis not present

## 2021-04-03 DIAGNOSIS — Z7901 Long term (current) use of anticoagulants: Secondary | ICD-10-CM | POA: Diagnosis not present

## 2021-04-03 DIAGNOSIS — I4891 Unspecified atrial fibrillation: Secondary | ICD-10-CM | POA: Diagnosis not present

## 2021-04-03 DIAGNOSIS — S22030D Wedge compression fracture of third thoracic vertebra, subsequent encounter for fracture with routine healing: Secondary | ICD-10-CM | POA: Diagnosis not present

## 2021-04-03 DIAGNOSIS — E039 Hypothyroidism, unspecified: Secondary | ICD-10-CM | POA: Diagnosis not present

## 2021-04-03 DIAGNOSIS — R791 Abnormal coagulation profile: Secondary | ICD-10-CM | POA: Diagnosis not present

## 2021-04-03 DIAGNOSIS — D473 Essential (hemorrhagic) thrombocythemia: Secondary | ICD-10-CM | POA: Diagnosis not present

## 2021-04-03 DIAGNOSIS — R2689 Other abnormalities of gait and mobility: Secondary | ICD-10-CM | POA: Diagnosis not present

## 2021-04-11 ENCOUNTER — Inpatient Hospital Stay (HOSPITAL_COMMUNITY): Payer: Medicare Other | Attending: Hematology

## 2021-04-11 ENCOUNTER — Other Ambulatory Visit: Payer: Self-pay

## 2021-04-11 DIAGNOSIS — D473 Essential (hemorrhagic) thrombocythemia: Secondary | ICD-10-CM | POA: Insufficient documentation

## 2021-04-11 LAB — COMPREHENSIVE METABOLIC PANEL
ALT: 15 U/L (ref 0–44)
AST: 24 U/L (ref 15–41)
Albumin: 3.7 g/dL (ref 3.5–5.0)
Alkaline Phosphatase: 56 U/L (ref 38–126)
Anion gap: 7 (ref 5–15)
BUN: 31 mg/dL — ABNORMAL HIGH (ref 8–23)
CO2: 27 mmol/L (ref 22–32)
Calcium: 9.1 mg/dL (ref 8.9–10.3)
Chloride: 103 mmol/L (ref 98–111)
Creatinine, Ser: 1.88 mg/dL — ABNORMAL HIGH (ref 0.44–1.00)
GFR, Estimated: 24 mL/min — ABNORMAL LOW (ref >60)
Glucose, Bld: 97 mg/dL (ref 70–99)
Potassium: 4.4 mmol/L (ref 3.5–5.1)
Sodium: 137 mmol/L (ref 135–145)
Total Bilirubin: 0.6 mg/dL (ref 0.3–1.2)
Total Protein: 6.7 g/dL (ref 6.5–8.1)

## 2021-04-11 LAB — CBC WITH DIFFERENTIAL/PLATELET
Abs Immature Granulocytes: 0.03 10*3/uL (ref 0.00–0.07)
Basophils Absolute: 0.1 10*3/uL (ref 0.0–0.1)
Basophils Relative: 1 %
Eosinophils Absolute: 0.3 10*3/uL (ref 0.0–0.5)
Eosinophils Relative: 4 %
HCT: 39.4 % (ref 36.0–46.0)
Hemoglobin: 12.5 g/dL (ref 12.0–15.0)
Immature Granulocytes: 0 %
Lymphocytes Relative: 18 %
Lymphs Abs: 1.6 10*3/uL (ref 0.7–4.0)
MCH: 32.6 pg (ref 26.0–34.0)
MCHC: 31.7 g/dL (ref 30.0–36.0)
MCV: 102.6 fL — ABNORMAL HIGH (ref 80.0–100.0)
Monocytes Absolute: 0.8 10*3/uL (ref 0.1–1.0)
Monocytes Relative: 9 %
Neutro Abs: 6.5 10*3/uL (ref 1.7–7.7)
Neutrophils Relative %: 68 %
Platelets: 747 10*3/uL — ABNORMAL HIGH (ref 150–400)
RBC: 3.84 MIL/uL — ABNORMAL LOW (ref 3.87–5.11)
RDW: 18.6 % — ABNORMAL HIGH (ref 11.5–15.5)
WBC: 9.3 10*3/uL (ref 4.0–10.5)
nRBC: 0 % (ref 0.0–0.2)

## 2021-04-17 ENCOUNTER — Inpatient Hospital Stay (HOSPITAL_COMMUNITY): Payer: Medicare Other | Attending: Hematology | Admitting: Physician Assistant

## 2021-04-17 ENCOUNTER — Other Ambulatory Visit: Payer: Self-pay

## 2021-04-17 VITALS — BP 126/63 | HR 80 | Resp 18 | Wt 99.6 lb

## 2021-04-17 DIAGNOSIS — D473 Essential (hemorrhagic) thrombocythemia: Secondary | ICD-10-CM | POA: Diagnosis not present

## 2021-04-17 DIAGNOSIS — I10 Essential (primary) hypertension: Secondary | ICD-10-CM | POA: Insufficient documentation

## 2021-04-17 DIAGNOSIS — Z79899 Other long term (current) drug therapy: Secondary | ICD-10-CM | POA: Diagnosis not present

## 2021-04-17 DIAGNOSIS — Z7901 Long term (current) use of anticoagulants: Secondary | ICD-10-CM | POA: Insufficient documentation

## 2021-04-17 DIAGNOSIS — Z87891 Personal history of nicotine dependence: Secondary | ICD-10-CM | POA: Insufficient documentation

## 2021-04-17 DIAGNOSIS — R1031 Right lower quadrant pain: Secondary | ICD-10-CM | POA: Insufficient documentation

## 2021-04-17 DIAGNOSIS — I4891 Unspecified atrial fibrillation: Secondary | ICD-10-CM | POA: Diagnosis not present

## 2021-04-17 DIAGNOSIS — R5383 Other fatigue: Secondary | ICD-10-CM | POA: Insufficient documentation

## 2021-04-17 NOTE — Progress Notes (Signed)
[place], [place] [address]   CLINIC:  Medical Oncology/Hematology  PCP:  Celene Squibb, MD [place] [id number] [phone]   REASON FOR VISIT:  Follow-up for essential thrombocytosis  PRIOR THERAPY: Hydrea  CURRENT THERAPY: Observation  INTERVAL HISTORY:  [name] 87 y.o. female returns for routine follow-up of her JAK2 positive essential thrombocytosis.  Jaton was last seen on 02/20/2021 by Tarri Abernethy PA-C and Dr. Delton Coombes.  At that time, she was recommended to continue on hydroxyurea 500 mg every other day.  However, the patient's son called on 03/26/2021, and stated that he would like to discontinue the patient's Hydrea due to significant fatigue.  The patient is a poor long-term historian due to some dementia, but she tells me that she feels well today.  Her only complaint is some pain in her right lower quadrant (which is chronic and longstanding).  Further history is provided by the patient's son today's visit.  He reports that she was having progressive pain, to the point that she eating.  After stopping Hydrea, he says that her energy level and appetite have improved, and that she seems to be more active and enjoying life more than when she was on Hydrea.  He is not sure if there are other factors contributing to her fatigue, but is hesitant to start Hydrea again.  No other complaints per the patient or her son.  She is on warfarin for atrial fibrillation, but has not noticed any recent bleeding such as epistaxis, hematuria or hematochezia.   She has 75% energy and 100% appetite. She endorses that she is maintaining a stable weight.   REVIEW OF SYSTEMS:  Review of Systems  Constitutional: Positive for fatigue (Improved after stopping Hydrea). Negative for appetite change, chills, diaphoresis, fever and unexpected weight change.  HENT:   Negative for lump/mass and nosebleeds.   Eyes: Negative for eye  problems.  Respiratory: Positive for cough (Secondary to seasonal allergies). Negative for hemoptysis and shortness of breath.   Cardiovascular: Negative for chest pain, leg swelling and palpitations.  Gastrointestinal: Positive for abdominal pain (RLQ, chronic). Negative for blood in stool, constipation, diarrhea, nausea and vomiting.  Genitourinary: Negative for hematuria.   Skin: Negative.   Neurological: Negative for dizziness, headaches and light-headedness.  Hematological: Does not bruise/bleed easily.  Psychiatric/Behavioral: Negative for confusion.      PAST MEDICAL/SURGICAL HISTORY:  Past Medical History:  Diagnosis Date  . Anxiety   . Arthritis   . Atrial fibrillation ([place])   . Constipation   . Depression   . Dyspnea   . Dysrhythmia    A-Fib  . GERD (gastroesophageal reflux disease)   . Hyperlipidemia   . Hypertension   . Hypothyroidism   . Nausea & vomiting   . T12 compression fracture ([place]) 06/2017  . Urinary frequency    Past Surgical History:  Procedure Laterality Date  . COLONOSCOPY    . KYPHOPLASTY N/A 06/30/2018   Procedure: KYPHOPLASTY L1 and T12;  Surgeon: Melina Schools, MD;  Location: [facility];  Service: Orthopedics;  Laterality: N/A;  90 mins  . NM MYOCAR PERF WALL MOTION  04/2006   dipyridamole; normal pattern of perfusion to all regions, post-stress EF 73%  . TONSILLECTOMY    . TRANSTHORACIC ECHOCARDIOGRAM  04/2012   EF=>55%, borderline conc LVH; LA mod dilated; RA mildly dilated; mild mitral annular calcif, mod MR; mild-mod TR with elevated RSVP 30-71mmHg, mild pulm HTN; mild  calcif of AV leaflets, AV mildly sclerotic; aortic root sclerosis/calcif  . UPPER GASTROINTESTINAL ENDOSCOPY       SOCIAL HISTORY:  Social History   Socioeconomic History  . Marital status: Widowed    Spouse name: Not on file  . Number of children: 3  . Years of education: Not on file  . Highest education level: Not on file  Occupational History    Employer: RETIRED   Tobacco Use  . Smoking status: Former Smoker    Quit date: 01/10/1985    Years since quitting: 36.2  . Smokeless tobacco: Never Used  . Tobacco comment: never inhaled  Vaping Use  . Vaping Use: Never used  Substance and Sexual Activity  . Alcohol use: No    Alcohol/week: 0.0 standard drinks  . Drug use: No  . Sexual activity: Not Currently  Other Topics Concern  . Not on file  Social History Narrative  . Not on file   Social Determinants of Health   Financial Resource Strain: Low Risk   . Difficulty of Paying Living Expenses: Not hard at all  Food Insecurity: No Food Insecurity  . Worried About Charity fundraiser in the Last Year: Never true  . Ran Out of Food in the Last Year: Never true  Transportation Needs: No Transportation Needs  . Lack of Transportation (Medical): No  . Lack of Transportation (Non-Medical): No  Physical Activity: Inactive  . Days of Exercise per Week: 0 days  . Minutes of Exercise per Session: 0 min  Stress: No Stress Concern Present  . Feeling of Stress : Not at all  Social Connections: Socially Isolated  . Frequency of Communication with Friends and Family: More than three times a week  . Frequency of Social Gatherings with Friends and Family: Three times a week  . Attends Religious Services: Never  . Active Member of Clubs or Organizations: No  . Attends Archivist Meetings: Never  . Marital Status: Widowed  Intimate Partner Violence: Not At Risk  . Fear of Current or Ex-Partner: No  . Emotionally Abused: No  . Physically Abused: No  . Sexually Abused: No    FAMILY HISTORY:  Family History  Problem Relation Age of Onset  . Cancer Mother   . Ovarian cancer Mother   . Cancer Father   . Cancer Brother   . Emphysema Brother   . Emphysema Brother   . Pneumonia Brother   . Aneurysm Brother   . Healthy Son   . Sleep apnea Son   . Aortic aneurysm Other   . Kidney cancer Son     CURRENT MEDICATIONS:  Outpatient Encounter  Medications as of 04/17/2021  Medication Sig Note  . ALPRAZolam (XANAX) 0.5 MG tablet Take 1 tablet (0.5 mg total) by mouth at bedtime as needed for anxiety or sleep.   . bisacodyl (DULCOLAX) 10 MG suppository Place 10 mg rectally every three (3) days as needed for moderate constipation. 04/20/2018: Uses as needed.  . DULoxetine (CYMBALTA) 20 MG capsule Take 20 mg by mouth daily.   . feeding supplement, ENSURE ENLIVE, (ENSURE ENLIVE) LIQD Take 237 mLs by mouth 2 (two) times daily between meals. (Patient taking differently: Take 237 mLs by mouth daily.)   . furosemide (LASIX) 20 MG tablet Take 20 mg by mouth daily.   Marland Kitchen HYDROcodone-acetaminophen (NORCO/VICODIN) 5-325 MG tablet Take 1 tablet by mouth 2 (two) times daily as needed.   . hydroxyurea (HYDREA) 500 MG capsule Take 1 capsule (500  mg total) by mouth every other day. May take with food to minimize GI side effects.   Marland Kitchen ipratropium (ATROVENT) 0.03 % nasal spray Place into both nostrils.   Marland Kitchen levothyroxine (SYNTHROID, LEVOTHROID) 75 MCG tablet Take 75 mcg by mouth daily before breakfast.   . loratadine (CLARITIN) 5 MG chewable tablet [id number]  loratadine 5 mg tablet Administer 1 tab(s) By mouth once a day   . Magnesium Oxide -Mg Supplement 250 MG TABS Take 1 tablet by mouth at bedtime.   . metoprolol (TOPROL-XL) 25 MG 24 hr tablet Take 12.5 mg by mouth at bedtime.   . metoprolol tartrate (LOPRESSOR) 25 MG tablet Take 12.5 mg by mouth daily.   . pantoprazole (PROTONIX) 40 MG tablet Take 40 mg by mouth daily.   . polyethylene glycol (MIRALAX / GLYCOLAX) 17 g packet Take 17 g by mouth daily as needed.   . potassium chloride (MICRO-K) 10 MEQ CR capsule Take 10 mEq by mouth daily.   . prochlorperazine (COMPAZINE) 5 MG tablet Take 1 tablet (5 mg total) by mouth every 6 (six) hours as needed for nausea or vomiting.   . simvastatin (ZOCOR) 10 MG tablet Take 10 mg by mouth at bedtime.   . sucralfate (CARAFATE) 1 g tablet Take 1 g by mouth 4 (four) times  daily.   Marland Kitchen warfarin (COUMADIN) 3 MG tablet Take 3 mg by mouth daily. Every Mon, Wed, Fri, Sat 06/30/2018: Restart on 07/02/18  . warfarin (COUMADIN) 6 MG tablet Take 6 mg by mouth. Every Tues, Thurs, Sun    No facility-administered encounter medications on file as of 04/17/2021.    ALLERGIES:  Allergies  Allergen Reactions  . Adhesive [Tape] Other (See Comments)    REACTION: pulls skin  . Codeine     UNKNOWN REACTION  . Prednisone     UNKNOWN  . Sulfonamide Derivatives     UNKNOWN REACTION  . Vicodin [Hydrocodone-Acetaminophen]     "Intolerance "     PHYSICAL EXAM:  ECOG PERFORMANCE STATUS: 2 - Symptomatic, <50% confined to bed  There were no vitals filed for this visit. There were no vitals filed for this visit. Physical Exam Constitutional:      Appearance: Normal appearance.  HENT:     Head: Normocephalic and atraumatic.     Mouth/Throat:     Mouth: Mucous membranes are moist.  Eyes:     Extraocular Movements: Extraocular movements intact.     Pupils: Pupils are equal, round, and reactive to light.  Cardiovascular:     Rate and Rhythm: Normal rate. Rhythm irregular.     Pulses: Normal pulses.     Heart sounds: Normal heart sounds.  Pulmonary:     Effort: Pulmonary effort is normal.     Breath sounds: Normal breath sounds.  Abdominal:     General: Bowel sounds are normal.     Palpations: Abdomen is soft.     Tenderness: There is abdominal tenderness (RLQ).  Musculoskeletal:        General: No swelling.     Right lower leg: No edema.     Left lower leg: No edema.  Lymphadenopathy:     Cervical: No cervical adenopathy.  Skin:    General: Skin is warm and dry.  Neurological:     General: No focal deficit present.     Mental Status: She is alert. Mental status is at baseline. She is disoriented.  Psychiatric:        Mood and Affect: Mood normal.  Behavior: Behavior normal.      LABORATORY DATA:  I have reviewed the labs as listed.  CBC     Component Value Date/Time   WBC 9.3 04/11/2021 1059   RBC 3.84 (L) 04/11/2021 1059   HGB 12.5 04/11/2021 1059   HGB 13.2 08/24/2018 1537   HCT 39.4 04/11/2021 1059   HCT 41.7 08/24/2018 1537   PLT 747 (H) 04/11/2021 1059   PLT 588 (H) 08/24/2018 1537   MCV 102.6 (H) 04/11/2021 1059   MCV 92 08/24/2018 1537   MCH 32.6 04/11/2021 1059   MCHC 31.7 04/11/2021 1059   RDW 18.6 (H) 04/11/2021 1059   RDW 13.1 08/24/2018 1537   LYMPHSABS 1.6 04/11/2021 1059   MONOABS 0.8 04/11/2021 1059   EOSABS 0.3 04/11/2021 1059   BASOSABS 0.1 04/11/2021 1059   CMP Latest Ref Rng & Units 04/11/2021 02/13/2021 01/15/2021  Glucose 70 - 99 mg/dL 97 86 129(H)  BUN 8 - 23 mg/dL 31(H) 33(H) 37(H)  Creatinine 0.44 - 1.00 mg/dL 1.88(H) 1.69(H) 1.88(H)  Sodium 135 - 145 mmol/L 137 136 136  Potassium 3.5 - 5.1 mmol/L 4.4 4.7 4.8  Chloride 98 - 111 mmol/L 103 100 102  CO2 22 - 32 mmol/L 27 29 26   Calcium 8.9 - 10.3 mg/dL 9.1 8.8(L) 9.3  Total Protein 6.5 - 8.1 g/dL 6.7 7.1 7.0  Total Bilirubin 0.3 - 1.2 mg/dL 0.6 0.6 0.6  Alkaline Phos 38 - 126 U/L 56 55 56  AST 15 - 41 U/L 24 22 21   ALT 0 - 44 U/L 15 13 13     DIAGNOSTIC IMAGING:  I have independently reviewed the relevant imaging and discussed with the patient.  ASSESSMENT: 1.  JAK2 V617F positive essential thrombocytosis -Diagnosed in December 2021 -Unable to tolerate 500 mg hydroxyurea daily due to extreme fatigue, nausea, vomiting  -Continues to have severe fatigue on every other day dosing - Hydrea discontinued on 03/26/2021 per family request -No history of thrombotic events - No aspirin, as patient is already on warfarin for atrial fibrillation - Most recent labs (04/11/2021) show platelets of 747   PLAN:  1.  JAK2 V617F positive essential thrombocytosis -Discussed risks and benefits of hydroxyurea treatment in detail with the patient's son   Risk of restarting Hydrea will be potential for recurrent fatigue, poor appetite, nausea and vomiting    Risk of not restarting Hydrea is worsening platelet count with risk of DVT, PE, CVA, MI, and other potentially fatal thromboembolic events -Patient's son verbalizes understanding of the above, and reports that he would like to hold off on Hydrea at this time, will continue to monitor platelet counts closely and readdress this at visit - Another option potentially be to start patient on anagrelide, but due to her pre-existing cardiac conditions (atrial fibrillation, pulmonary hypertension, hypertension), I would be hesitant about the increased risk of cardiovascular side effects - No aspirin, as patient is already on warfarin for atrial fibrillation - RTC in 4-week for repeat CBC and follow-up visit   PLAN SUMMARY & DISPOSITION: -Discontinue Hydrea - RTC in 4 weeks for repeat CBC and follow-up visit  All questions were answered. The patient knows to call the clinic with any problems, questions or concerns.  Medical decision making: Low  Time spent on visit: I spent 15 minutes counseling the patient face to face. The total time spent in the appointment was 25 minutes and more than 50% was on counseling.  The above clinical data and plan of treatment was discussed  with Dr. Delton Coombes, supervising physician, who agrees.  Harriett Rush, PA-C  04/17/21 11:01 AM

## 2021-05-06 DIAGNOSIS — Z5181 Encounter for therapeutic drug level monitoring: Secondary | ICD-10-CM | POA: Diagnosis not present

## 2021-05-06 DIAGNOSIS — H6123 Impacted cerumen, bilateral: Secondary | ICD-10-CM | POA: Diagnosis not present

## 2021-05-07 ENCOUNTER — Other Ambulatory Visit (HOSPITAL_COMMUNITY)
Admission: RE | Admit: 2021-05-07 | Discharge: 2021-05-07 | Disposition: A | Discharge status: Home / Self Care | Payer: Medicare Other | Source: Skilled Nursing Facility | Attending: Internal Medicine | Admitting: Internal Medicine

## 2021-05-07 DIAGNOSIS — M79674 Pain in right toe(s): Secondary | ICD-10-CM | POA: Diagnosis not present

## 2021-05-07 DIAGNOSIS — B351 Tinea unguium: Secondary | ICD-10-CM | POA: Diagnosis not present

## 2021-05-07 DIAGNOSIS — M79675 Pain in left toe(s): Secondary | ICD-10-CM | POA: Diagnosis not present

## 2021-05-07 DIAGNOSIS — N390 Urinary tract infection, site not specified: Secondary | ICD-10-CM | POA: Insufficient documentation

## 2021-05-07 LAB — URINALYSIS, ROUTINE W REFLEX MICROSCOPIC
Bilirubin Urine: NEGATIVE
Glucose, UA: NEGATIVE mg/dL
Hgb urine dipstick: NEGATIVE
Ketones, ur: NEGATIVE mg/dL
Nitrite: NEGATIVE
Protein, ur: NEGATIVE mg/dL
Specific Gravity, Urine: 1.013 (ref 1.005–1.030)
WBC, UA: >50 WBC/hpf — ABNORMAL HIGH (ref 0–5)
pH: 6 (ref 5.0–8.0)

## 2021-05-09 LAB — URINE CULTURE

## 2021-05-14 ENCOUNTER — Inpatient Hospital Stay (HOSPITAL_COMMUNITY): Payer: Medicare Other

## 2021-05-14 ENCOUNTER — Emergency Department (HOSPITAL_COMMUNITY): Payer: Medicare Other

## 2021-05-14 ENCOUNTER — Encounter (HOSPITAL_COMMUNITY): Payer: Self-pay | Admitting: *Deleted

## 2021-05-14 ENCOUNTER — Other Ambulatory Visit: Payer: Self-pay

## 2021-05-14 ENCOUNTER — Inpatient Hospital Stay (HOSPITAL_COMMUNITY)
Admission: EM | Admit: 2021-05-14 | Discharge: 2021-05-22 | DRG: 480 | Disposition: A | Discharge status: Nursing Home (Includes ICF) | Payer: Medicare Other | Source: Skilled Nursing Facility | Attending: Family Medicine | Admitting: Family Medicine

## 2021-05-14 DIAGNOSIS — R4189 Other symptoms and signs involving cognitive functions and awareness: Secondary | ICD-10-CM | POA: Diagnosis not present

## 2021-05-14 DIAGNOSIS — E785 Hyperlipidemia, unspecified: Secondary | ICD-10-CM | POA: Diagnosis present

## 2021-05-14 DIAGNOSIS — Z7901 Long term (current) use of anticoagulants: Secondary | ICD-10-CM

## 2021-05-14 DIAGNOSIS — Z79890 Hormone replacement therapy (postmenopausal): Secondary | ICD-10-CM | POA: Diagnosis not present

## 2021-05-14 DIAGNOSIS — I482 Chronic atrial fibrillation, unspecified: Secondary | ICD-10-CM | POA: Diagnosis present

## 2021-05-14 DIAGNOSIS — Z20822 Contact with and (suspected) exposure to covid-19: Secondary | ICD-10-CM | POA: Diagnosis not present

## 2021-05-14 DIAGNOSIS — N184 Chronic kidney disease, stage 4 (severe): Secondary | ICD-10-CM | POA: Diagnosis not present

## 2021-05-14 DIAGNOSIS — E876 Hypokalemia: Secondary | ICD-10-CM | POA: Diagnosis present

## 2021-05-14 DIAGNOSIS — Z79899 Other long term (current) drug therapy: Secondary | ICD-10-CM | POA: Diagnosis not present

## 2021-05-14 DIAGNOSIS — N179 Acute kidney failure, unspecified: Secondary | ICD-10-CM | POA: Diagnosis present

## 2021-05-14 DIAGNOSIS — Y92092 Bedroom in other non-institutional residence as the place of occurrence of the external cause: Secondary | ICD-10-CM

## 2021-05-14 DIAGNOSIS — M25551 Pain in right hip: Secondary | ICD-10-CM | POA: Diagnosis not present

## 2021-05-14 DIAGNOSIS — R918 Other nonspecific abnormal finding of lung field: Secondary | ICD-10-CM | POA: Diagnosis not present

## 2021-05-14 DIAGNOSIS — R0602 Shortness of breath: Secondary | ICD-10-CM | POA: Diagnosis not present

## 2021-05-14 DIAGNOSIS — R0689 Other abnormalities of breathing: Secondary | ICD-10-CM

## 2021-05-14 DIAGNOSIS — R4182 Altered mental status, unspecified: Secondary | ICD-10-CM

## 2021-05-14 DIAGNOSIS — S199XXA Unspecified injury of neck, initial encounter: Secondary | ICD-10-CM | POA: Diagnosis not present

## 2021-05-14 DIAGNOSIS — I4821 Permanent atrial fibrillation: Secondary | ICD-10-CM

## 2021-05-14 DIAGNOSIS — S72001A Fracture of unspecified part of neck of right femur, initial encounter for closed fracture: Secondary | ICD-10-CM | POA: Diagnosis not present

## 2021-05-14 DIAGNOSIS — S72144A Nondisplaced intertrochanteric fracture of right femur, initial encounter for closed fracture: Secondary | ICD-10-CM | POA: Diagnosis not present

## 2021-05-14 DIAGNOSIS — T40415A Adverse effect of fentanyl or fentanyl analogs, initial encounter: Secondary | ICD-10-CM | POA: Diagnosis present

## 2021-05-14 DIAGNOSIS — K219 Gastro-esophageal reflux disease without esophagitis: Secondary | ICD-10-CM | POA: Diagnosis not present

## 2021-05-14 DIAGNOSIS — D75839 Thrombocytosis, unspecified: Secondary | ICD-10-CM | POA: Diagnosis not present

## 2021-05-14 DIAGNOSIS — I10 Essential (primary) hypertension: Secondary | ICD-10-CM | POA: Diagnosis present

## 2021-05-14 DIAGNOSIS — Z87891 Personal history of nicotine dependence: Secondary | ICD-10-CM | POA: Diagnosis not present

## 2021-05-14 DIAGNOSIS — S0121XA Laceration without foreign body of nose, initial encounter: Secondary | ICD-10-CM | POA: Diagnosis present

## 2021-05-14 DIAGNOSIS — D72828 Other elevated white blood cell count: Secondary | ICD-10-CM | POA: Diagnosis not present

## 2021-05-14 DIAGNOSIS — G928 Other toxic encephalopathy: Secondary | ICD-10-CM | POA: Diagnosis not present

## 2021-05-14 DIAGNOSIS — R1310 Dysphagia, unspecified: Secondary | ICD-10-CM

## 2021-05-14 DIAGNOSIS — I340 Nonrheumatic mitral (valve) insufficiency: Secondary | ICD-10-CM | POA: Diagnosis not present

## 2021-05-14 DIAGNOSIS — Z66 Do not resuscitate: Secondary | ICD-10-CM | POA: Diagnosis present

## 2021-05-14 DIAGNOSIS — E039 Hypothyroidism, unspecified: Secondary | ICD-10-CM | POA: Diagnosis not present

## 2021-05-14 DIAGNOSIS — W19XXXA Unspecified fall, initial encounter: Secondary | ICD-10-CM

## 2021-05-14 DIAGNOSIS — I6782 Cerebral ischemia: Secondary | ICD-10-CM | POA: Diagnosis not present

## 2021-05-14 DIAGNOSIS — Z8041 Family history of malignant neoplasm of ovary: Secondary | ICD-10-CM | POA: Diagnosis not present

## 2021-05-14 DIAGNOSIS — J40 Bronchitis, not specified as acute or chronic: Secondary | ICD-10-CM | POA: Diagnosis not present

## 2021-05-14 DIAGNOSIS — I130 Hypertensive heart and chronic kidney disease with heart failure and stage 1 through stage 4 chronic kidney disease, or unspecified chronic kidney disease: Secondary | ICD-10-CM | POA: Diagnosis present

## 2021-05-14 DIAGNOSIS — R0600 Dyspnea, unspecified: Secondary | ICD-10-CM

## 2021-05-14 DIAGNOSIS — R638 Other symptoms and signs concerning food and fluid intake: Secondary | ICD-10-CM | POA: Diagnosis not present

## 2021-05-14 DIAGNOSIS — Z515 Encounter for palliative care: Secondary | ICD-10-CM | POA: Diagnosis not present

## 2021-05-14 DIAGNOSIS — I5032 Chronic diastolic (congestive) heart failure: Secondary | ICD-10-CM | POA: Diagnosis not present

## 2021-05-14 DIAGNOSIS — G9341 Metabolic encephalopathy: Secondary | ICD-10-CM | POA: Diagnosis not present

## 2021-05-14 DIAGNOSIS — I4891 Unspecified atrial fibrillation: Secondary | ICD-10-CM | POA: Diagnosis present

## 2021-05-14 DIAGNOSIS — W06XXXA Fall from bed, initial encounter: Secondary | ICD-10-CM | POA: Diagnosis present

## 2021-05-14 DIAGNOSIS — S72011A Unspecified intracapsular fracture of right femur, initial encounter for closed fracture: Secondary | ICD-10-CM | POA: Diagnosis not present

## 2021-05-14 DIAGNOSIS — J811 Chronic pulmonary edema: Secondary | ICD-10-CM | POA: Diagnosis not present

## 2021-05-14 DIAGNOSIS — G319 Degenerative disease of nervous system, unspecified: Secondary | ICD-10-CM | POA: Diagnosis not present

## 2021-05-14 DIAGNOSIS — Z8051 Family history of malignant neoplasm of kidney: Secondary | ICD-10-CM

## 2021-05-14 DIAGNOSIS — Z7189 Other specified counseling: Secondary | ICD-10-CM | POA: Diagnosis not present

## 2021-05-14 DIAGNOSIS — G9340 Encephalopathy, unspecified: Secondary | ICD-10-CM | POA: Diagnosis not present

## 2021-05-14 DIAGNOSIS — Z043 Encounter for examination and observation following other accident: Secondary | ICD-10-CM | POA: Diagnosis not present

## 2021-05-14 DIAGNOSIS — S0990XA Unspecified injury of head, initial encounter: Secondary | ICD-10-CM | POA: Diagnosis not present

## 2021-05-14 DIAGNOSIS — S0083XA Contusion of other part of head, initial encounter: Secondary | ICD-10-CM

## 2021-05-14 LAB — CBC WITH DIFFERENTIAL/PLATELET
Abs Immature Granulocytes: 0.06 10*3/uL (ref 0.00–0.07)
Abs Immature Granulocytes: 0.11 10*3/uL — ABNORMAL HIGH (ref 0.00–0.07)
Basophils Absolute: 0 10*3/uL (ref 0.0–0.1)
Basophils Absolute: 0.1 10*3/uL (ref 0.0–0.1)
Basophils Relative: 0 %
Basophils Relative: 0 %
Eosinophils Absolute: 0.4 10*3/uL (ref 0.0–0.5)
Eosinophils Absolute: 0.2 10*3/uL (ref 0.0–0.5)
Eosinophils Relative: 4 %
Eosinophils Relative: 1 %
HCT: 35.3 % — ABNORMAL LOW (ref 36.0–46.0)
HCT: 37.9 % (ref 36.0–46.0)
Hemoglobin: 11.5 g/dL — ABNORMAL LOW (ref 12.0–15.0)
Hemoglobin: 12.2 g/dL (ref 12.0–15.0)
Immature Granulocytes: 1 %
Immature Granulocytes: 1 %
Lymphocytes Relative: 16 %
Lymphocytes Relative: 11 %
Lymphs Abs: 1.6 10*3/uL (ref 0.7–4.0)
Lymphs Abs: 2 10*3/uL (ref 0.7–4.0)
MCH: 33.3 pg (ref 26.0–34.0)
MCH: 32.6 pg (ref 26.0–34.0)
MCHC: 32.6 g/dL (ref 30.0–36.0)
MCHC: 32.2 g/dL (ref 30.0–36.0)
MCV: 102.3 fL — ABNORMAL HIGH (ref 80.0–100.0)
MCV: 101.3 fL — ABNORMAL HIGH (ref 80.0–100.0)
Monocytes Absolute: 0.8 10*3/uL (ref 0.1–1.0)
Monocytes Absolute: 1.2 10*3/uL — ABNORMAL HIGH (ref 0.1–1.0)
Monocytes Relative: 8 %
Monocytes Relative: 6 %
Neutro Abs: 6.9 10*3/uL (ref 1.7–7.7)
Neutro Abs: 15.2 10*3/uL — ABNORMAL HIGH (ref 1.7–7.7)
Neutrophils Relative %: 71 %
Neutrophils Relative %: 81 %
Platelets: 865 10*3/uL — ABNORMAL HIGH (ref 150–400)
Platelets: 851 10*3/uL — ABNORMAL HIGH (ref 150–400)
RBC: 3.45 MIL/uL — ABNORMAL LOW (ref 3.87–5.11)
RBC: 3.74 MIL/uL — ABNORMAL LOW (ref 3.87–5.11)
RDW: 17.1 % — ABNORMAL HIGH (ref 11.5–15.5)
RDW: 16.9 % — ABNORMAL HIGH (ref 11.5–15.5)
WBC: 9.7 10*3/uL (ref 4.0–10.5)
WBC: 18.8 10*3/uL — ABNORMAL HIGH (ref 4.0–10.5)
nRBC: 0 % (ref 0.0–0.2)
nRBC: 0 % (ref 0.0–0.2)

## 2021-05-14 LAB — BLOOD GAS, ARTERIAL
Acid-base deficit: 1.5 mmol/L (ref 0.0–2.0)
Bicarbonate: 23 mmol/L (ref 20.0–28.0)
FIO2: 30
O2 Saturation: 91.9 %
Patient temperature: 36.9
pCO2 arterial: 39 mmHg (ref 32.0–48.0)
pH, Arterial: 7.385 (ref 7.350–7.450)
pO2, Arterial: 64.1 mmHg — ABNORMAL LOW (ref 83.0–108.0)

## 2021-05-14 LAB — BASIC METABOLIC PANEL
Anion gap: 7 (ref 5–15)
Anion gap: 11 (ref 5–15)
BUN: 43 mg/dL — ABNORMAL HIGH (ref 8–23)
BUN: 40 mg/dL — ABNORMAL HIGH (ref 8–23)
CO2: 28 mmol/L (ref 22–32)
CO2: 23 mmol/L (ref 22–32)
Calcium: 8.9 mg/dL (ref 8.9–10.3)
Calcium: 8.6 mg/dL — ABNORMAL LOW (ref 8.9–10.3)
Chloride: 101 mmol/L (ref 98–111)
Chloride: 102 mmol/L (ref 98–111)
Creatinine, Ser: 2.07 mg/dL — ABNORMAL HIGH (ref 0.44–1.00)
Creatinine, Ser: 1.78 mg/dL — ABNORMAL HIGH (ref 0.44–1.00)
GFR, Estimated: 22 mL/min — ABNORMAL LOW (ref >60)
GFR, Estimated: 26 mL/min — ABNORMAL LOW (ref >60)
Glucose, Bld: 92 mg/dL (ref 70–99)
Glucose, Bld: 164 mg/dL — ABNORMAL HIGH (ref 70–99)
Potassium: 4.9 mmol/L (ref 3.5–5.1)
Potassium: 4.3 mmol/L (ref 3.5–5.1)
Sodium: 136 mmol/L (ref 135–145)
Sodium: 136 mmol/L (ref 135–145)

## 2021-05-14 LAB — PROTIME-INR
INR: 2.7 — ABNORMAL HIGH (ref 0.8–1.2)
Prothrombin Time: 28.4 seconds — ABNORMAL HIGH (ref 11.4–15.2)

## 2021-05-14 LAB — GLUCOSE, CAPILLARY: Glucose-Capillary: 149 mg/dL — ABNORMAL HIGH (ref 70–99)

## 2021-05-14 LAB — RESP PANEL BY RT-PCR (FLU A&B, COVID) ARPGX2
Influenza A by PCR: NEGATIVE
Influenza B by PCR: NEGATIVE
SARS Coronavirus 2 by RT PCR: NEGATIVE

## 2021-05-14 LAB — TYPE AND SCREEN
ABO/RH(D): A POS
Antibody Screen: NEGATIVE

## 2021-05-14 LAB — ABO/RH: ABO/RH(D): A POS

## 2021-05-14 MED ORDER — ACETAMINOPHEN 325 MG PO TABS: 650.0000 mg | ORAL_TABLET | Freq: Four times a day (QID) | ORAL | Status: DC | PRN

## 2021-05-14 MED ORDER — CHLORHEXIDINE GLUCONATE 4 % EX LIQD
60.0000 mL | Freq: Once | CUTANEOUS | Status: DC
  Filled 2021-05-14: qty 15

## 2021-05-14 MED ORDER — FENTANYL CITRATE (PF) 100 MCG/2ML IJ SOLN
50.0000 ug | Freq: Once | INTRAMUSCULAR | Status: AC
Start: 2021-05-14 — End: 2021-05-14
  Administered 2021-05-14: 50 ug via INTRAVENOUS
  Filled 2021-05-14: qty 2

## 2021-05-14 MED ORDER — ACETAMINOPHEN 650 MG RE SUPP: 650.0000 mg | Freq: Four times a day (QID) | RECTAL | Status: DC | PRN

## 2021-05-14 MED ORDER — POLYETHYLENE GLYCOL 3350 17 G PO PACK: 17.0000 g | PACK | Freq: Every day | ORAL | Status: DC | PRN

## 2021-05-14 MED ORDER — POVIDONE-IODINE 10 % EX SWAB: 2.0000 "application " | Freq: Once | CUTANEOUS | Status: DC

## 2021-05-14 MED ORDER — FUROSEMIDE 10 MG/ML IJ SOLN
40.0000 mg | INTRAMUSCULAR | Status: AC
  Administered 2021-05-14: 40 mg via INTRAVENOUS
  Filled 2021-05-14: qty 4

## 2021-05-14 MED ORDER — HYDROMORPHONE HCL 1 MG/ML PO LIQD
0.5000 mg | ORAL | Status: DC | PRN
Start: 2021-05-14 — End: 2021-05-14

## 2021-05-14 MED ORDER — ONDANSETRON HCL 4 MG PO TABS: 4.0000 mg | ORAL_TABLET | Freq: Four times a day (QID) | ORAL | Status: DC | PRN

## 2021-05-14 MED ORDER — VITAMIN K1 10 MG/ML IJ SOLN
INTRAMUSCULAR | Status: AC
  Filled 2021-05-14: qty 1

## 2021-05-14 MED ORDER — MORPHINE SULFATE (PF) 2 MG/ML IV SOLN
2.0000 mg | INTRAVENOUS | Status: DC | PRN
  Administered 2021-05-15 – 2021-05-17 (×2): 2 mg via INTRAVENOUS
  Filled 2021-05-14 (×2): qty 1

## 2021-05-14 MED ORDER — DEXTROSE-NACL 5-0.9 % IV SOLN: INTRAVENOUS | Status: DC

## 2021-05-14 MED ORDER — ONDANSETRON HCL 4 MG/2ML IJ SOLN: 4.0000 mg | Freq: Four times a day (QID) | INTRAMUSCULAR | Status: DC | PRN

## 2021-05-14 MED ORDER — CEFAZOLIN SODIUM-DEXTROSE 2-4 GM/100ML-% IV SOLN: 2.0000 g | INTRAVENOUS | Status: AC

## 2021-05-14 MED ORDER — VITAMIN K1 10 MG/ML IJ SOLN
5.0000 mg | INTRAVENOUS | Status: AC
  Administered 2021-05-14: 5 mg via INTRAVENOUS
  Filled 2021-05-14: qty 0.5

## 2021-05-14 NOTE — ED Triage Notes (Signed)
Pt brought in by [facility] from [facility] for c/o fall; pt rolled out of bed landing in between bed and ac unit; pt c/o right thigh pain and pain to bridge of her nose; pt has small laceration to bridge of nose

## 2021-05-14 NOTE — ED Notes (Signed)
Pt going to CT

## 2021-05-14 NOTE — ED Notes (Signed)
Pt had incontinence of bladder; pt changed and clean depend placed on pt

## 2021-05-14 NOTE — Progress Notes (Signed)
Went to hang patient Vitamin K and she called out help me. She then took a big gasp and her eyes rolled in the back of her head. Patient became unresponsive. Decreased respirations. Rapid response team called at 2226. Vitals checked. BS checked. BP 46/32. At 2229 1L NS bolus given. EKG ordered. O2 100% on 2.5L [place]. EKG done. BP rechecked 165/95. MD at bedside. Orders for STAT CT head without contrast and chest xray. Orders to transfer patient to ICU bed C7.    Called and gave report to [facility] ICU RN at [phone].

## 2021-05-14 NOTE — ED Notes (Signed)
Pt placed on 4 LPM of oxygen via [place] due to desaturation after medication administration. Desaturation to 80% on room air. Now at 98%

## 2021-05-14 NOTE — H&P (Addendum)
History and Physical    [name]:[id number] DOB: 1923/09/16 DOA: 05/14/2021  PCP: Celene Squibb, MD   Patient coming from: [place] [facility]  I have personally briefly reviewed patient's old medical records in [place]  Chief Complaint: FALL  HPI: Leslie Rosario is a 87 y.o. female with medical history significant for atrial fibrillation, mitral regurgitation, hypertension. Was brought to the ED with reports of fall.  Patient rolled over from her bed and fell landed on her right hip.  She is complaining of pain to her nose where she has a small laceration, and right hip pain. She denies any other complaints to me.  ED Course: Blood pressure systolic 702O- 378H, otherwise stable vitals.  O2 sats 92 to 95% on room air, dropped to 90 after fentanyl was given, was placed on 4 L nasal cannula with sats improving to 98%. INR 2.7.  Head CT without acute abnormality, chest x-ray reveals mild vascular congestion, interstitial edema.  Pelvic x-ray shows right femoral neck fracture. EDP talked to Dr. Aline Brochure, will see in consult, hospitalist to reverse INR.  Review of Systems: As per HPI all other systems reviewed and negative.  Past Medical History:  Diagnosis Date  . Anxiety   . Arthritis   . Atrial fibrillation ([facility])   . Constipation   . Depression   . Dyspnea   . Dysrhythmia    A-Fib  . GERD (gastroesophageal reflux disease)   . Hyperlipidemia   . Hypertension   . Hypothyroidism   . Nausea & vomiting   . T12 compression fracture ([place]) 06/2017  . Urinary frequency     Past Surgical History:  Procedure Laterality Date  . COLONOSCOPY    . KYPHOPLASTY N/A 06/30/2018   Procedure: KYPHOPLASTY L1 and T12;  Surgeon: Melina Schools, MD;  Location: [facility];  Service: Orthopedics;  Laterality: N/A;  90 mins  . NM MYOCAR PERF WALL MOTION  04/2006   dipyridamole; normal pattern of perfusion to all regions, post-stress EF 73%  . TONSILLECTOMY    . TRANSTHORACIC ECHOCARDIOGRAM  04/2012    EF=>55%, borderline conc LVH; LA mod dilated; RA mildly dilated; mild mitral annular calcif, mod MR; mild-mod TR with elevated RSVP 30-45mmHg, mild pulm HTN; mild calcif of AV leaflets, AV mildly sclerotic; aortic root sclerosis/calcif  . UPPER GASTROINTESTINAL ENDOSCOPY       reports that she quit smoking about 36 years ago. She has never used smokeless tobacco. She reports that she does not drink alcohol and does not use drugs.  Allergies  Allergen Reactions  . Adhesive [Tape] Other (See Comments)    REACTION: pulls skin  . Codeine     UNKNOWN REACTION  . Prednisone     UNKNOWN  . Sulfonamide Derivatives     UNKNOWN REACTION  . Vicodin [Hydrocodone-Acetaminophen]     "Intolerance "    Family History  Problem Relation Age of Onset  . Cancer Mother   . Ovarian cancer Mother   . Cancer Father   . Cancer Brother   . Emphysema Brother   . Emphysema Brother   . Pneumonia Brother   . Aneurysm Brother   . Healthy Son   . Sleep apnea Son   . Aortic aneurysm Other   . Kidney cancer Son     Prior to Admission medications   Medication Sig Start Date End Date Taking? Authorizing Provider  ALPRAZolam Duanne Moron) 0.5 MG tablet Take 1 tablet (0.5 mg total) by mouth at bedtime as  needed for anxiety or sleep. 11/14/18   Barton Dubois, MD  bisacodyl (DULCOLAX) 10 MG suppository Place 10 mg rectally every three (3) days as needed for moderate constipation.    [provider]  DULoxetine (CYMBALTA) 20 MG capsule Take 20 mg by mouth daily.    [provider]  feeding supplement, ENSURE ENLIVE, (ENSURE ENLIVE) LIQD Take 237 mLs by mouth 2 (two) times daily between meals. Patient taking differently: Take 237 mLs by mouth daily. 11/14/18   Barton Dubois, MD  fluticasone [facility]) 50 MCG/ACT nasal spray Place into both nostrils. 03/23/21   [provider]  furosemide (LASIX) 20 MG tablet Take 20 mg by mouth daily. 11/25/19   [provider]   HYDROcodone-acetaminophen (NORCO/VICODIN) 5-325 MG tablet Take 1 tablet by mouth 2 (two) times daily as needed. Patient not taking: Reported on 04/17/2021 12/26/20   [provider]  hydroxyurea (HYDREA) 500 MG capsule Take 1 capsule (500 mg total) by mouth every other day. May take with food to minimize GI side effects. Patient not taking: Reported on 04/17/2021 02/20/21   Harriett Rush, PA-C  ipratropium (ATROVENT) 0.03 % nasal spray Place into both nostrils. 10/12/20   [provider]  levothyroxine (SYNTHROID) 88 MCG tablet Take 88 mcg by mouth daily. 04/10/21   [provider]  levothyroxine (SYNTHROID, LEVOTHROID) 75 MCG tablet Take 75 mcg by mouth daily before breakfast.    [provider]  loratadine (CLARITIN) 5 MG chewable tablet [id number]  loratadine 5 mg tablet Administer 1 tab(s) By mouth once a day 11/25/19   [provider]  Magnesium Oxide -Mg Supplement 250 MG TABS Take 1 tablet by mouth at bedtime.    [provider]  metoprolol (TOPROL-XL) 25 MG 24 hr tablet Take 12.5 mg by mouth at bedtime.    [provider]  metoprolol tartrate (LOPRESSOR) 25 MG tablet Take 12.5 mg by mouth daily. 12/13/20   [provider]  pantoprazole (PROTONIX) 40 MG tablet Take 40 mg by mouth daily. 10/18/20   [provider]  polyethylene glycol (MIRALAX / GLYCOLAX) 17 g packet Take 17 g by mouth daily as needed.    [provider]  potassium chloride (MICRO-K) 10 MEQ CR capsule Take 10 mEq by mouth daily. 10/18/20   [provider]  prochlorperazine (COMPAZINE) 5 MG tablet Take 1 tablet (5 mg total) by mouth every 6 (six) hours as needed for nausea or vomiting. Patient not taking: Reported on 04/17/2021 12/19/20   Derek Jack, MD  simvastatin (ZOCOR) 10 MG tablet Take 10 mg by mouth at bedtime. 10/18/20   [provider]  sucralfate (CARAFATE) 1 g tablet Take 1 g by mouth 4 (four) times daily.  11/25/19   [provider]  warfarin (COUMADIN) 3 MG tablet Take 3 mg by mouth daily. Every Mon, Wed, Fri, Sat    [provider]  warfarin (COUMADIN) 6 MG tablet Take 6 mg by mouth. Every Aris Everts 10/18/20   [provider]    Physical Exam: Vitals:   05/14/21 1712  BP: (!) 160/71  Pulse: 77  Resp: 18  Temp: 97.8 F (36.6 C)  TempSrc: Oral  SpO2: 92%    Constitutional: NAD, calm, comfortable Vitals:   05/14/21 1712  BP: (!) 160/71  Pulse: 77  Resp: 18  Temp: 97.8 F (36.6 C)  TempSrc: Oral  SpO2: 92%   Eyes: PERRL, lids and conjunctivae normal ENMT: Mucous membranes are dry  Neck: normal,  supple, no masses, no thyromegaly Respiratory: clear to auscultation bilaterally, no wheezing, no crackles. Normal respiratory effort. No accessory muscle use.  Cardiovascular: Regular rate and rhythm, no murmurs / rubs / gallops. No extremity edema. 2+ pedal pulses. No carotid bruits.  Abdomen: no tenderness, no masses palpated. No hepatosplenomegaly. Bowel sounds positive.  Musculoskeletal: no clubbing / cyanosis. No obvious joint deformity upper and lower extremities.  Normal muscle tone.  Skin: no rashes, lesions, ulcers. No induration Neurologic: Moving extremities spontaneously, right lower extremity not tested. Psychiatric: Normal judgment and insight. Alert and oriented x 3.    Labs on Admission: I have personally reviewed following labs and imaging studies  CBC: Recent Labs  Lab 05/14/21 1721  WBC 9.7  NEUTROABS 6.9  HGB 11.5*  HCT 35.3*  MCV 102.3*  PLT 638*   Basic Metabolic Panel: Recent Labs  Lab 05/14/21 1721  NA 136  K 4.9  CL 101  CO2 28  GLUCOSE 92  BUN 43*  CREATININE 2.07*  CALCIUM 8.9   Coagulation Profile: Recent Labs  Lab 05/14/21 1721  INR 2.7*    Radiological Exams on Admission: DG Chest 1 View  Result Date: 05/14/2021 CLINICAL DATA:  Fall, hip pain EXAM: CHEST  1 VIEW COMPARISON:  11/12/2018  FINDINGS: Heart is borderline in size. Peribronchial thickening, vascular congestion and interstitial prominence. This could reflect interstitial edema. No confluent opacities or effusions. IMPRESSION: Bronchitic changes. Suspect mild vascular congestion and interstitial edema. Electronically Signed   By: Rolm Baptise M.D.   On: 05/14/2021 18:19   CT Head Wo Contrast  Result Date: 05/14/2021 CLINICAL DATA:  Status post fall out of bed.  Facial laceration. EXAM: CT HEAD WITHOUT CONTRAST CT CERVICAL SPINE WITHOUT CONTRAST TECHNIQUE: Multidetector CT imaging of the head and cervical spine was performed following the standard protocol without intravenous contrast. Multiplanar CT image reconstructions of the cervical spine were also generated. COMPARISON:  Head CT 09/12/2017.  CT neck 10/10/2016. FINDINGS: CT HEAD FINDINGS Brain: No evidence of acute infarction, hemorrhage, hydrocephalus, extra-axial collection or mass lesion/mass effect. Vascular: No hyperdense vessel or unexpected calcification. Skull: Intact.  No focal lesion. Sinuses/Orbits: Wall and mucosal thickening in the right maxillary sinus consistent with chronic sinus disease appear unchanged. Mild ethmoid air cell disease on left noted. Other: None. CT CERVICAL SPINE FINDINGS Alignment: Normal. Skull base and vertebrae: No acute fracture. No primary bone lesion or focal pathologic process. Soft tissues and spinal canal: No prevertebral fluid or swelling. No visible canal hematoma. Disc levels: Mild multilevel loss of disc space height and facet arthropathy noted. Upper chest: There is interlobular septal thickening and ground-glass attenuation in the lung apices. Other: None. IMPRESSION: No acute abnormality head or cervical spine. Appearance of the lung apices suggestive of interstitial edema. Electronically Signed   By: Inge Rise M.D.   On: 05/14/2021 17:54   CT Cervical Spine Wo Contrast  Result Date: 05/14/2021 CLINICAL DATA:  Status post  fall out of bed.  Facial laceration. EXAM: CT HEAD WITHOUT CONTRAST CT CERVICAL SPINE WITHOUT CONTRAST TECHNIQUE: Multidetector CT imaging of the head and cervical spine was performed following the standard protocol without intravenous contrast. Multiplanar CT image reconstructions of the cervical spine were also generated. COMPARISON:  Head CT 09/12/2017.  CT neck 10/10/2016. FINDINGS: CT HEAD FINDINGS Brain: No evidence of acute infarction, hemorrhage, hydrocephalus, extra-axial collection or mass lesion/mass effect. Vascular: No hyperdense vessel or unexpected calcification. Skull: Intact.  No focal lesion. Sinuses/Orbits: Wall and mucosal thickening in the  right maxillary sinus consistent with chronic sinus disease appear unchanged. Mild ethmoid air cell disease on left noted. Other: None. CT CERVICAL SPINE FINDINGS Alignment: Normal. Skull base and vertebrae: No acute fracture. No primary bone lesion or focal pathologic process. Soft tissues and spinal canal: No prevertebral fluid or swelling. No visible canal hematoma. Disc levels: Mild multilevel loss of disc space height and facet arthropathy noted. Upper chest: There is interlobular septal thickening and ground-glass attenuation in the lung apices. Other: None. IMPRESSION: No acute abnormality head or cervical spine. Appearance of the lung apices suggestive of interstitial edema. Electronically Signed   By: Inge Rise M.D.   On: 05/14/2021 17:54   DG Hip Unilat With Pelvis 2-3 Views Right  Result Date: 05/14/2021 CLINICAL DATA:  Fall, right hip pain EXAM: DG HIP (WITH OR WITHOUT PELVIS) 2-3V RIGHT COMPARISON:  07/18/2012 FINDINGS: There is a right femoral neck fracture noted. No subluxation or dislocation. Slight impaction. Early degenerative changes in the hips bilaterally. IMPRESSION: Right femoral neck fracture. Electronically Signed   By: Rolm Baptise M.D.   On: 05/14/2021 18:16    EKG: Independently reviewed.  Atrial fibrillation rate 92.   QTc 441.  No significant change from prior.  Assessment/Plan Principal Problem:   Closed right hip fracture (HCC) Active Problems:   Atrial fibrillation (HCC)   AMS (altered mental status)   Closed right hip fracture s/p mechanical fall-pelvic x-ray right femoral neck fracture.  Head CT unremarkable. - EDP talked to Dr. Aline Brochure, will see in consult - N.p.o. midnight -Reverse INR with IV vitamin K 5 mg x 1 -IV Dilaudid 0.5 mg as needed (initial drop in O2 sats after Fentanyl was given) -Addendum-sudden change in mental status, vitamin K was about to be started, had not received any pain medications on the floor.  Patient not responding to sternal rub, now obtunded, eyes open, pupils equal, loud/snoring mouth breathing, no seizure activity witnessed, lung exam unremarkable.  Systolic blood pressure down to 40s.  Patient is on warfarin.  Initial head CT was unremarkable. -1 L bolus ordered, repeat BMP, CBC, head CT, chest x-ray, obtain ABG.  Stat EKG- Afib with RVR rate 127, otherwise unchanged. Patient is DNR.  - Called patient's son Akyra Bouchie, updated on patient's poor clinical status and likelihood for further decline and patient may not recover from this.  He likely will come tonight to see patient.  I confirmed patient is DNR. -Transfer to stepdown - Chest xray showing increasing congestion, o2 sats 92% on 3L , unremarkable ABG, she had only received the bolus given for hypotension. Further fluids stopped, IV lasix 40mg  x 1 .   Atrial fibrillation-on warfarin INR 2.7 and rate controlled. -Hold warfarin  Thrombocytosis platelets 865.  Follows with Dr. Delton Coombes -Resume hydroxyurea.  HTN-elevated -continue metoprolol  Hypothyroidism-resume Synthroid   DVT prophylaxis: Scds Code Status:  DNR, universal DNR form at bedside. Family Communication: None at bedside, called patient's son Monchel Pollitt listed on demographics, no response Disposition Plan: ~ 2 days Consults called:  Ortho Admission status: Inpt, tele >> stepdown. I certify that at the point of admission it is my clinical judgment that the patient will require inpatient hospital care spanning beyond 2 midnights from the point of admission due to high intensity of service, high risk for further deterioration and high frequency of surveillance required. The following factors support the patient status of inpatient:    Bethena Roys MD [facility]  05/14/2021, 10:59 PM

## 2021-05-14 NOTE — ED Provider Notes (Signed)
[facility] EMERGENCY DEPARTMENT Provider Note   CSN: [id number] Arrival date & time: 05/14/21  1653     History No chief complaint on file.   Leslie Rosario is a 87 y.o. female.  She has a history of A. fib and is on Coumadin.  She said she was in bed and she rolled over and fell out of bed landing on her right hip.  She denies any loss of consciousness.  She is complaining of nose pain and right hip pain.  She denies any neck pain.  No chest pain or shortness of breath.  No numbness or weakness.  She was placed in a cervical collar by EMS  The history is provided by the patient and the EMS personnel.  Fall This is a new problem. The current episode started 1 to 2 hours ago. The problem occurs constantly. The problem has not changed since onset.Pertinent negatives include no chest pain, no abdominal pain, no headaches and no shortness of breath. Associated symptoms comments: Hip right and nose. The symptoms are aggravated by bending and twisting. Nothing relieves the symptoms. She has tried nothing for the symptoms. The treatment provided no relief.       Past Medical History:  Diagnosis Date  . Anxiety   . Arthritis   . Atrial fibrillation ([place])   . Constipation   . Depression   . Dyspnea   . Dysrhythmia    A-Fib  . GERD (gastroesophageal reflux disease)   . Hyperlipidemia   . Hypertension   . Hypothyroidism   . Nausea & vomiting   . T12 compression fracture ([place]) 06/2017  . Urinary frequency     Patient Active Problem List   Diagnosis Date Noted  . Essential thrombocythemia ([place]) 12/19/2020  . Thrombocytosis 11/01/2020  . Leukocytosis 11/01/2020  . Community acquired pneumonia 11/12/2018  . Weakness 11/12/2018  . Pneumonia 11/12/2018  . S/P kyphoplasty 06/30/2018  . Acute encephalopathy 09/13/2017  . T12 compression fracture ([place]) 09/13/2017  . Altered mental status   . Chronic prescription benzodiazepine use   . Opiate use   . Slurred speech 09/12/2017  .  Essential hypertension 07/23/2016  . Hypothyroidism 03/30/2015  . Mild pulmonary hypertension ([place]) 01/16/2014  . Moderate mitral regurgitation by prior echocardiogram 01/16/2014  . Long term current use of anticoagulant therapy 02/20/2013  . Dehydration 07/18/2012  . UTI (urinary tract infection) 07/18/2012  . Constipation 02/17/2012  . Acute gastritis 12/29/2011  . GERD (gastroesophageal reflux disease)   . Atrial fibrillation ([place]) 09/12/2010  . POLYDIPSIA 09/12/2010  . DYSPHAGIA 08/22/2010  . ABDOMINAL PAIN, LEFT LOWER QUADRANT 03/05/2010  . HYPERCHOLESTEROLEMIA 03/04/2010  . HYPERLIPIDEMIA 03/04/2010  . Anxiety state 03/04/2010  . GERD 03/04/2010  . HIATAL HERNIA, HX OF 03/04/2010    Past Surgical History:  Procedure Laterality Date  . COLONOSCOPY    . KYPHOPLASTY N/A 06/30/2018   Procedure: KYPHOPLASTY L1 and T12;  Surgeon: Melina Schools, MD;  Location: [facility];  Service: Orthopedics;  Laterality: N/A;  90 mins  . NM MYOCAR PERF WALL MOTION  04/2006   dipyridamole; normal pattern of perfusion to all regions, post-stress EF 73%  . TONSILLECTOMY    . TRANSTHORACIC ECHOCARDIOGRAM  04/2012   EF=>55%, borderline conc LVH; LA mod dilated; RA mildly dilated; mild mitral annular calcif, mod MR; mild-mod TR with elevated RSVP 30-71mmHg, mild pulm HTN; mild calcif of AV leaflets, AV mildly sclerotic; aortic root sclerosis/calcif  . UPPER GASTROINTESTINAL ENDOSCOPY  OB History   No obstetric history on file.     Family History  Problem Relation Age of Onset  . Cancer Mother   . Ovarian cancer Mother   . Cancer Father   . Cancer Brother   . Emphysema Brother   . Emphysema Brother   . Pneumonia Brother   . Aneurysm Brother   . Healthy Son   . Sleep apnea Son   . Aortic aneurysm Other   . Kidney cancer Son     Social History   Tobacco Use  . Smoking status: Former Smoker    Quit date: 01/10/1985    Years since quitting: 36.3  . Smokeless tobacco: Never Used  .  Tobacco comment: never inhaled  Vaping Use  . Vaping Use: Never used  Substance Use Topics  . Alcohol use: No    Alcohol/week: 0.0 standard drinks  . Drug use: No    Home Medications Prior to Admission medications   Medication Sig Start Date End Date Taking? Authorizing Provider  ALPRAZolam Duanne Moron) 0.5 MG tablet Take 1 tablet (0.5 mg total) by mouth at bedtime as needed for anxiety or sleep. 11/14/18   Barton Dubois, MD  bisacodyl (DULCOLAX) 10 MG suppository Place 10 mg rectally every three (3) days as needed for moderate constipation.    [provider]  DULoxetine (CYMBALTA) 20 MG capsule Take 20 mg by mouth daily.    [provider]  feeding supplement, ENSURE ENLIVE, (ENSURE ENLIVE) LIQD Take 237 mLs by mouth 2 (two) times daily between meals. Patient taking differently: Take 237 mLs by mouth daily. 11/14/18   Barton Dubois, MD  fluticasone [facility]) 50 MCG/ACT nasal spray Place into both nostrils. 03/23/21   [provider]  furosemide (LASIX) 20 MG tablet Take 20 mg by mouth daily. 11/25/19   [provider]  HYDROcodone-acetaminophen (NORCO/VICODIN) 5-325 MG tablet Take 1 tablet by mouth 2 (two) times daily as needed. Patient not taking: Reported on 04/17/2021 12/26/20   [provider]  hydroxyurea (HYDREA) 500 MG capsule Take 1 capsule (500 mg total) by mouth every other day. May take with food to minimize GI side effects. Patient not taking: Reported on 04/17/2021 02/20/21   Harriett Rush, PA-C  ipratropium (ATROVENT) 0.03 % nasal spray Place into both nostrils. 10/12/20   [provider]  levothyroxine (SYNTHROID) 88 MCG tablet Take 88 mcg by mouth daily. 04/10/21   [provider]  levothyroxine (SYNTHROID, LEVOTHROID) 75 MCG tablet Take 75 mcg by mouth daily before breakfast.    [provider]  loratadine (CLARITIN) 5 MG chewable tablet [id number]  loratadine 5 mg tablet Administer 1 tab(s) By mouth once a  day 11/25/19   [provider]  Magnesium Oxide -Mg Supplement 250 MG TABS Take 1 tablet by mouth at bedtime.    [provider]  metoprolol (TOPROL-XL) 25 MG 24 hr tablet Take 12.5 mg by mouth at bedtime.    [provider]  metoprolol tartrate (LOPRESSOR) 25 MG tablet Take 12.5 mg by mouth daily. 12/13/20   [provider]  pantoprazole (PROTONIX) 40 MG tablet Take 40 mg by mouth daily. 10/18/20   [provider]  polyethylene glycol (MIRALAX / GLYCOLAX) 17 g packet Take 17 g by mouth daily as needed.    [provider]  potassium chloride (MICRO-K) 10 MEQ CR capsule Take 10 mEq by mouth daily. 10/18/20   [provider]  prochlorperazine (COMPAZINE) 5 MG tablet Take 1 tablet (5  mg total) by mouth every 6 (six) hours as needed for nausea or vomiting. Patient not taking: Reported on 04/17/2021 12/19/20   Derek Jack, MD  simvastatin (ZOCOR) 10 MG tablet Take 10 mg by mouth at bedtime. 10/18/20   [provider]  sucralfate (CARAFATE) 1 g tablet Take 1 g by mouth 4 (four) times daily. 11/25/19   [provider]  warfarin (COUMADIN) 3 MG tablet Take 3 mg by mouth daily. Every Mon, Wed, Fri, Sat    [provider]  warfarin (COUMADIN) 6 MG tablet Take 6 mg by mouth. Every Aris Everts 10/18/20   [provider]    Allergies    Adhesive [tape], Codeine, Prednisone, Sulfonamide derivatives, and Vicodin [hydrocodone-acetaminophen]  Review of Systems   Review of Systems  Constitutional: Negative for fever.  HENT: Negative for sore throat.   Eyes: Negative for visual disturbance.  Respiratory: Negative for shortness of breath.   Cardiovascular: Negative for chest pain.  Gastrointestinal: Negative for abdominal pain.  Genitourinary: Negative for dysuria.  Musculoskeletal: Negative for neck pain.  Skin: Positive for wound.  Neurological: Negative for headaches.    Physical Exam Updated  Vital Signs BP (!) 150/71   Pulse 86   Temp 98.6 F (37 C) (Oral)   Resp 15   SpO2 100%   Physical Exam Vitals and nursing note reviewed.  Constitutional:      General: She is not in acute distress.    Appearance: Normal appearance. She is well-developed.  HENT:     Head: Normocephalic.     Comments: She has a small abrasion with some dried blood and some swelling over the bridge of her nose Eyes:     Conjunctiva/sclera: Conjunctivae normal.  Neck:     Comments: Cervical collar in place trach midline Cardiovascular:     Rate and Rhythm: Normal rate and regular rhythm.     Heart sounds: No murmur heard.   Pulmonary:     Effort: Pulmonary effort is normal. No respiratory distress.     Breath sounds: Normal breath sounds.  Abdominal:     Palpations: Abdomen is soft.     Tenderness: There is no abdominal tenderness.  Musculoskeletal:        General: Tenderness present. Normal range of motion.     Right lower leg: No edema.     Left lower leg: No edema.     Comments: Full range of motion of upper extremities without any pain or limitations.  No pain or limitations range of motion of left lower extremity.  She has some tenderness around her left hip and upper thigh.  Knee and ankle nontender.  No shortening or rotation noted  Skin:    General: Skin is warm and dry.     Capillary Refill: Capillary refill takes less than 2 seconds.  Neurological:     General: No focal deficit present.     Mental Status: She is alert.     Sensory: No sensory deficit.     Motor: No weakness.     ED Results / Procedures / Treatments   Labs (all labs ordered are listed, but only abnormal results are displayed) Labs Reviewed  BASIC METABOLIC PANEL - Abnormal; Notable for the following components:      Result Value   BUN 43 (*)    Creatinine, Ser 2.07 (*)    GFR, Estimated 22 (*)    All other components within normal limits  CBC WITH DIFFERENTIAL/PLATELET - Abnormal; Notable  for the  following components:   RBC 3.45 (*)    Hemoglobin 11.5 (*)    HCT 35.3 (*)    MCV 102.3 (*)    RDW 17.1 (*)    Platelets 865 (*)    All other components within normal limits  PROTIME-INR - Abnormal; Notable for the following components:   Prothrombin Time 28.4 (*)    INR 2.7 (*)    All other components within normal limits  PROTIME-INR - Abnormal; Notable for the following components:   Prothrombin Time 25.1 (*)    INR 2.3 (*)    All other components within normal limits  BASIC METABOLIC PANEL - Abnormal; Notable for the following components:   Glucose, Bld 155 (*)    BUN 39 (*)    Creatinine, Ser 1.69 (*)    GFR, Estimated 27 (*)    All other components within normal limits  CBC - Abnormal; Notable for the following components:   WBC 18.7 (*)    RBC 3.74 (*)    RDW 16.7 (*)    Platelets 847 (*)    All other components within normal limits  GLUCOSE, CAPILLARY - Abnormal; Notable for the following components:   Glucose-Capillary 149 (*)    All other components within normal limits  BLOOD GAS, ARTERIAL - Abnormal; Notable for the following components:   pO2, Arterial 64.1 (*)    All other components within normal limits  CBC WITH DIFFERENTIAL/PLATELET - Abnormal; Notable for the following components:   WBC 18.8 (*)    RBC 3.74 (*)    MCV 101.3 (*)    RDW 16.9 (*)    Platelets 851 (*)    Neutro Abs 15.2 (*)    Monocytes Absolute 1.2 (*)    Abs Immature Granulocytes 0.11 (*)    All other components within normal limits  BASIC METABOLIC PANEL - Abnormal; Notable for the following components:   Glucose, Bld 164 (*)    BUN 40 (*)    Creatinine, Ser 1.78 (*)    Calcium 8.6 (*)    GFR, Estimated 26 (*)    All other components within normal limits  RESP PANEL BY RT-PCR (FLU A&B, COVID) [id number]  MRSA PCR SCREENING  TYPE AND SCREEN  ABO/RH    EKG EKG Interpretation  Date/Time:  Tuesday May 14 2021 18:52:45 EDT Ventricular Rate:  92 PR Interval:    QRS  Duration: 97 QT Interval:  343 QTC Calculation: 441 R Axis:   32 Text Interpretation: Atrial fibrillation Ventricular premature complex Low voltage, precordial leads Borderline T abnormalities, anterior leads ST elevation, consider inferior injury poor baseline Confirmed by Aletta Edouard [phone]) on 05/14/2021 7:02:06 PM   Radiology DG Chest 1 View  Result Date: 05/14/2021 CLINICAL DATA:  Fall, hip pain EXAM: CHEST  1 VIEW COMPARISON:  11/12/2018 FINDINGS: Heart is borderline in size. Peribronchial thickening, vascular congestion and interstitial prominence. This could reflect interstitial edema. No confluent opacities or effusions. IMPRESSION: Bronchitic changes. Suspect mild vascular congestion and interstitial edema. Electronically Signed   By: Rolm Baptise M.D.   On: 05/14/2021 18:19   CT HEAD WO CONTRAST  Result Date: 05/14/2021 CLINICAL DATA:  87 year old female with encephalopathy. EXAM: CT HEAD WITHOUT CONTRAST TECHNIQUE: Contiguous axial images were obtained from the base of the skull through the vertex without intravenous contrast. COMPARISON:  Head CT dated 05/14/2021. FINDINGS: Brain: Moderate age-related atrophy and chronic microvascular ischemic changes. There is no acute intracranial hemorrhage. No mass effect or midline shift.  No extra-axial fluid collection. Vascular: No hyperdense vessel or unexpected calcification. Skull: Normal. Negative for fracture or focal lesion. Sinuses/Orbits: Right maxillary sinus retention cyst or polyp. The remainder of the visualized paranasal sinuses and mastoid air cells are clear. Other: None IMPRESSION: 1. No acute intracranial pathology. 2. Moderate age-related atrophy and chronic microvascular ischemic changes. Electronically Signed   By: Anner Crete M.D.   On: 05/14/2021 23:01   CT Head Wo Contrast  Result Date: 05/14/2021 CLINICAL DATA:  Status post fall out of bed.  Facial laceration. EXAM: CT HEAD WITHOUT CONTRAST CT CERVICAL SPINE  WITHOUT CONTRAST TECHNIQUE: Multidetector CT imaging of the head and cervical spine was performed following the standard protocol without intravenous contrast. Multiplanar CT image reconstructions of the cervical spine were also generated. COMPARISON:  Head CT 09/12/2017.  CT neck 10/10/2016. FINDINGS: CT HEAD FINDINGS Brain: No evidence of acute infarction, hemorrhage, hydrocephalus, extra-axial collection or mass lesion/mass effect. Vascular: No hyperdense vessel or unexpected calcification. Skull: Intact.  No focal lesion. Sinuses/Orbits: Wall and mucosal thickening in the right maxillary sinus consistent with chronic sinus disease appear unchanged. Mild ethmoid air cell disease on left noted. Other: None. CT CERVICAL SPINE FINDINGS Alignment: Normal. Skull base and vertebrae: No acute fracture. No primary bone lesion or focal pathologic process. Soft tissues and spinal canal: No prevertebral fluid or swelling. No visible canal hematoma. Disc levels: Mild multilevel loss of disc space height and facet arthropathy noted. Upper chest: There is interlobular septal thickening and ground-glass attenuation in the lung apices. Other: None. IMPRESSION: No acute abnormality head or cervical spine. Appearance of the lung apices suggestive of interstitial edema. Electronically Signed   By: Inge Rise M.D.   On: 05/14/2021 17:54   CT Cervical Spine Wo Contrast  Result Date: 05/14/2021 CLINICAL DATA:  Status post fall out of bed.  Facial laceration. EXAM: CT HEAD WITHOUT CONTRAST CT CERVICAL SPINE WITHOUT CONTRAST TECHNIQUE: Multidetector CT imaging of the head and cervical spine was performed following the standard protocol without intravenous contrast. Multiplanar CT image reconstructions of the cervical spine were also generated. COMPARISON:  Head CT 09/12/2017.  CT neck 10/10/2016. FINDINGS: CT HEAD FINDINGS Brain: No evidence of acute infarction, hemorrhage, hydrocephalus, extra-axial collection or mass  lesion/mass effect. Vascular: No hyperdense vessel or unexpected calcification. Skull: Intact.  No focal lesion. Sinuses/Orbits: Wall and mucosal thickening in the right maxillary sinus consistent with chronic sinus disease appear unchanged. Mild ethmoid air cell disease on left noted. Other: None. CT CERVICAL SPINE FINDINGS Alignment: Normal. Skull base and vertebrae: No acute fracture. No primary bone lesion or focal pathologic process. Soft tissues and spinal canal: No prevertebral fluid or swelling. No visible canal hematoma. Disc levels: Mild multilevel loss of disc space height and facet arthropathy noted. Upper chest: There is interlobular septal thickening and ground-glass attenuation in the lung apices. Other: None. IMPRESSION: No acute abnormality head or cervical spine. Appearance of the lung apices suggestive of interstitial edema. Electronically Signed   By: Inge Rise M.D.   On: 05/14/2021 17:54   DG CHEST PORT 1 VIEW  Result Date: 05/14/2021 CLINICAL DATA:  Unresponsive EXAM: PORTABLE CHEST 1 VIEW COMPARISON:  05/14/2021 FINDINGS: Cardiac shadow is stable. Aortic calcifications are again seen. Lungs are well aerated bilaterally. Increasing interstitial edema is noted. No confluent infiltrate or effusion is noted. Changes of prior vertebral augmentation are again seen. IMPRESSION: Increasing pulmonary edema. Electronically Signed   By: Inez Catalina M.D.   On: 05/14/2021 23:18  DG Hip Unilat With Pelvis 2-3 Views Right  Result Date: 05/14/2021 CLINICAL DATA:  Fall, right hip pain EXAM: DG HIP (WITH OR WITHOUT PELVIS) 2-3V RIGHT COMPARISON:  07/18/2012 FINDINGS: There is a right femoral neck fracture noted. No subluxation or dislocation. Slight impaction. Early degenerative changes in the hips bilaterally. IMPRESSION: Right femoral neck fracture. Electronically Signed   By: Rolm Baptise M.D.   On: 05/14/2021 18:16    Procedures Procedures   Medications Ordered in ED Medications   acetaminophen (TYLENOL) tablet 650 mg (has no administration in time range)    Or  acetaminophen (TYLENOL) suppository 650 mg (has no administration in time range)  ondansetron (ZOFRAN) tablet 4 mg (has no administration in time range)    Or  ondansetron (ZOFRAN) injection 4 mg (has no administration in time range)  polyethylene glycol (MIRALAX / GLYCOLAX) packet 17 g (has no administration in time range)  phytonadione (VITAMIN K) 5 mg in dextrose 5 % 50 mL IVPB (0 mg Intravenous Paused 05/14/21 2227)  chlorhexidine (HIBICLENS) 4 % liquid 4 application (0 application Topical Hold 05/15/21 0015)  povidone-iodine 10 % swab 2 application (2 application Topical Not Given 05/15/21 0511)  ceFAZolin (ANCEF) IVPB 2g/100 mL premix (has no administration in time range)  morphine 2 MG/ML injection 2 mg (has no administration in time range)  Chlorhexidine Gluconate Cloth 2 % PADS 6 each (6 each Topical Given 05/15/21 0913)  fentaNYL (SUBLIMAZE) injection 50 mcg (50 mcg Intravenous Given 05/14/21 1953)  furosemide (LASIX) injection 40 mg (40 mg Intravenous Given 05/14/21 2336)    ED Course  I have reviewed the triage vital signs and the nursing notes.  Pertinent labs & imaging results that were available during my care of the patient were reviewed by me and considered in my medical decision making (see chart for details).  Clinical Course as of 05/15/21 [id number]  Tue May 14, 2021  1810 Family member patient is here now.  He said she does not walk much.  Staff helps her get dressed and get up.  Labs look like they are at her baseline.  Chest x-ray looks like she has a little fluid overload.  Hip x-ray with probable femoral neck fracture [MB]  1826 Family states she does walk with a walker at her facility at [facility] [facility]. [MB]  4034 Discussed with Dr. Aline Brochure orthopedics.  He is recommending a medical admission.  She will need to have her INR fully corrected that she will probably need a spinal anesthesia for the  procedure.  Medicine to reach back out with him once her INR is normalized [MB]  1829 Discussed with Dr. Denton Brick [facility] hospitalist who will evaluate the patient for admission. [MB]    Clinical Course User Index [MB] Hayden Rasmussen, MD   MDM Rules/Calculators/A&P                         This patient complains of right hip pain after a fall; this involves an extensive number of treatment Options and is a complaint that carries with it a high risk of complications and Morbidity. The differential includes fracture, contusion, dislocation, head bleed  I ordered, reviewed and interpreted labs, which included CBC with normal white count, hemoglobin similar to priors, chemistries with chronic CKD, INR slightly supratherapeutic at 2.7, COVID testing negative I ordered medication IV pain medication with improvement in her symptoms I ordered imaging studies which included head CT cervical spine CT chest x-ray and pelvis  right hip x-rays and I independently    visualized and interpreted imaging which showed acute femoral neck fracture. Additional history obtained from patient's son and EMS Previous records obtained and reviewed in epic, no recent admissions I consulted orthopedics Dr. Aline Brochure and [facility] hospitalist Dr. Denton Brick And discussed lab and imaging findings  Critical Interventions: None  After the interventions stated above, I reevaluated the patient and found patient's pain to be controlled.  She will need to be admitted to the hospital for further management.   Final Clinical Impression(s) / ED Diagnoses Final diagnoses:  Closed nondisplaced intertrochanteric fracture of right femur, initial encounter ([place])  Fall, initial encounter  Contusion of face, initial encounter    Rx / DC Orders ED Discharge Orders    None       Hayden Rasmussen, MD 05/15/21 [phone]

## 2021-05-15 ENCOUNTER — Inpatient Hospital Stay (HOSPITAL_COMMUNITY): Payer: Medicare Other

## 2021-05-15 ENCOUNTER — Encounter (HOSPITAL_COMMUNITY)
Admission: EM | Disposition: A | Discharge status: Nursing Home (Includes ICF) | Payer: Self-pay | Source: Skilled Nursing Facility | Attending: Family Medicine

## 2021-05-15 ENCOUNTER — Encounter (HOSPITAL_COMMUNITY): Payer: Self-pay | Admitting: Internal Medicine

## 2021-05-15 DIAGNOSIS — Z7901 Long term (current) use of anticoagulants: Secondary | ICD-10-CM

## 2021-05-15 DIAGNOSIS — G9341 Metabolic encephalopathy: Secondary | ICD-10-CM | POA: Diagnosis not present

## 2021-05-15 DIAGNOSIS — Z7189 Other specified counseling: Secondary | ICD-10-CM

## 2021-05-15 DIAGNOSIS — E039 Hypothyroidism, unspecified: Secondary | ICD-10-CM

## 2021-05-15 DIAGNOSIS — I5032 Chronic diastolic (congestive) heart failure: Secondary | ICD-10-CM

## 2021-05-15 DIAGNOSIS — Z515 Encounter for palliative care: Secondary | ICD-10-CM

## 2021-05-15 DIAGNOSIS — R1310 Dysphagia, unspecified: Secondary | ICD-10-CM

## 2021-05-15 LAB — ECHOCARDIOGRAM COMPLETE: S' Lateral: 2.14 cm

## 2021-05-15 LAB — GLUCOSE, CAPILLARY
Glucose-Capillary: 121 mg/dL — ABNORMAL HIGH (ref 70–99)
Glucose-Capillary: 127 mg/dL — ABNORMAL HIGH (ref 70–99)

## 2021-05-15 LAB — CBC
HCT: 37.3 % (ref 36.0–46.0)
Hemoglobin: 12.3 g/dL (ref 12.0–15.0)
MCH: 32.9 pg (ref 26.0–34.0)
MCHC: 33 g/dL (ref 30.0–36.0)
MCV: 99.7 fL (ref 80.0–100.0)
Platelets: 847 10*3/uL — ABNORMAL HIGH (ref 150–400)
RBC: 3.74 MIL/uL — ABNORMAL LOW (ref 3.87–5.11)
RDW: 16.7 % — ABNORMAL HIGH (ref 11.5–15.5)
WBC: 18.7 10*3/uL — ABNORMAL HIGH (ref 4.0–10.5)
nRBC: 0 % (ref 0.0–0.2)

## 2021-05-15 LAB — BASIC METABOLIC PANEL
Anion gap: 11 (ref 5–15)
BUN: 39 mg/dL — ABNORMAL HIGH (ref 8–23)
CO2: 25 mmol/L (ref 22–32)
Calcium: 9 mg/dL (ref 8.9–10.3)
Chloride: 101 mmol/L (ref 98–111)
Creatinine, Ser: 1.69 mg/dL — ABNORMAL HIGH (ref 0.44–1.00)
GFR, Estimated: 27 mL/min — ABNORMAL LOW (ref >60)
Glucose, Bld: 155 mg/dL — ABNORMAL HIGH (ref 70–99)
Potassium: 4.5 mmol/L (ref 3.5–5.1)
Sodium: 137 mmol/L (ref 135–145)

## 2021-05-15 LAB — URINALYSIS, ROUTINE W REFLEX MICROSCOPIC
Bilirubin Urine: NEGATIVE
Glucose, UA: NEGATIVE mg/dL
Hgb urine dipstick: NEGATIVE
Ketones, ur: NEGATIVE mg/dL
Leukocytes,Ua: NEGATIVE
Nitrite: NEGATIVE
Protein, ur: NEGATIVE mg/dL
Specific Gravity, Urine: 1.012 (ref 1.005–1.030)
pH: 6 (ref 5.0–8.0)

## 2021-05-15 LAB — MRSA PCR SCREENING: MRSA by PCR: NEGATIVE

## 2021-05-15 LAB — PROTIME-INR
INR: 2.3 — ABNORMAL HIGH (ref 0.8–1.2)
Prothrombin Time: 25.1 seconds — ABNORMAL HIGH (ref 11.4–15.2)

## 2021-05-15 SURGERY — FIXATION, FEMUR, NECK, PERCUTANEOUS, USING SCREW
Anesthesia: General | Site: Hip | Laterality: Right

## 2021-05-15 MED ORDER — PANTOPRAZOLE SODIUM 40 MG PO TBEC
40.0000 mg | DELAYED_RELEASE_TABLET | Freq: Every day | ORAL | Status: DC
  Administered 2021-05-15 – 2021-05-17 (×3): 40 mg via ORAL
  Filled 2021-05-15 (×3): qty 1

## 2021-05-15 MED ORDER — LEVOTHYROXINE SODIUM 88 MCG PO TABS: 88.0000 ug | ORAL_TABLET | Freq: Every day | ORAL | Status: DC

## 2021-05-15 MED ORDER — DULOXETINE HCL 30 MG PO CPEP
30.0000 mg | ORAL_CAPSULE | Freq: Every day | ORAL | Status: DC
  Administered 2021-05-15 – 2021-05-16 (×2): 30 mg via ORAL
  Filled 2021-05-15 (×2): qty 1

## 2021-05-15 MED ORDER — CHLORHEXIDINE GLUCONATE CLOTH 2 % EX PADS
6.0000 | MEDICATED_PAD | Freq: Every day | CUTANEOUS | Status: DC
  Administered 2021-05-15 – 2021-05-16 (×2): 6 via TOPICAL

## 2021-05-15 MED ORDER — OXYCODONE HCL 5 MG PO TABS: 5.0000 mg | ORAL_TABLET | Freq: Four times a day (QID) | ORAL | Status: DC | PRN

## 2021-05-15 MED ORDER — ALPRAZOLAM 0.25 MG PO TABS: 0.2500 mg | ORAL_TABLET | Freq: Two times a day (BID) | ORAL | Status: DC | PRN

## 2021-05-15 MED ORDER — METOPROLOL TARTRATE 25 MG PO TABS
25.0000 mg | ORAL_TABLET | Freq: Two times a day (BID) | ORAL | Status: DC
  Administered 2021-05-15 – 2021-05-17 (×4): 25 mg via ORAL
  Filled 2021-05-15 (×4): qty 1

## 2021-05-15 MED ORDER — HYDROXYUREA 500 MG PO CAPS
500.0000 mg | ORAL_CAPSULE | ORAL | Status: DC
  Administered 2021-05-16: 500 mg via ORAL
  Filled 2021-05-15 (×2): qty 1

## 2021-05-15 MED ORDER — FUROSEMIDE 10 MG/ML IJ SOLN
20.0000 mg | Freq: Every day | INTRAMUSCULAR | Status: DC
  Administered 2021-05-15 – 2021-05-17 (×3): 20 mg via INTRAVENOUS
  Filled 2021-05-15 (×3): qty 2

## 2021-05-15 NOTE — Progress Notes (Signed)
Patient Demographics:    Leslie Rosario, is a 87 y.o. female, DOB - 1923-11-15, QIO:[id number]  Admit date - 05/14/2021   Admitting Physician Leslie Arlyce Dice, MD  Outpatient Primary MD for the patient is Leslie Squibb, MD  LOS - 1   Chief Complaint  Patient presents with  . Fall        Subjective:    Leslie Rosario today has no fevers, no emesis,  No chest pain,   Oldest son Leslie Rosario at Bedside, --- patient is awake, having difficulties with oral intake -On telemetry no significant new arrhythmias except for previously known atrial fibrillation  Assessment  & Plan :    Principal Problem:   Closed right hip fracture [facility]) Active Problems:   Atrial fibrillation ([place])   Long term current use of anticoagulant therapy   Acute metabolic encephalopathy   Dysphagia   Hypothyroidism   Essential hypertension   AMS (altered mental status)  Brief Summary:- 87 y.o. female with medical history significant for atrial fibrillation on coumadin, mitral regurgitation, HTN admitted on 05/14/2021 with Right Hip Fx after a mechanical fall, subsequently after 10 PM on 05/14/2020 developed unresponsive episode presumably after getting fentanyl in the ED previously, was found to be in flash pulmonary edema responded well to IV Lasix   A/p 1) acute metabolic encephalopathy--- unresponsive episode after 10 PM on 05/14/2021--- more awake, more alert, more interactive CT head and CT C-spine without acute findings-- -Suspect this was related to IV fentanyl and transient hemodynamic compromise  2)HFpEF--- patient appears to have chronic diastolic dysfunction CHF,  --echo from 09/13/2017 with EF of 60 to 65%, on 05/14/2021 patient had "flash pulmonary edema" after 10 PM after receiving IV fentanyl previously in the ED -- please see chest x-ray report, she responded well to IV Lasix -Continue Lasix -Updated echo requested -Repeat  chest x-ray on 05/16/2021 after additional diuresis -Monitor fluid intake and output and daily weight carefully especially given underlying CKD 4  3) chronic atrial fibrillation--hold Coumadin to allow for hip surgery, INR currently 2.3, metoprolol 25 mg p.o. twice daily for rate control  4) chronic anticoagulation--- in the setting of chronic A. fib, continue to hold Coumadin to allow for right hip surgery, INR down to 2.3 from 2.7 -Consider vitamin K on 05/16/2021 if INR still elevated to allow for surgery on 05/17/2021  5)AKI----acute kidney injury on CKD stage -iV   creatinine on admission= 2.07  , baseline creatinine =  1.7 to 1.8   ,  -creatinine is now=  1.69 (renal function appears to be back to baseline) - renally adjust medications, avoid nephrotoxic agents / dehydration  / hypotension --Monitor fluid intake and output and daily weight carefully especially given underlying CKD 4  6)Leukocytosis-- ??  Reactive secondary to hip fracture and flash pulmonary edema -No fevers or other evidence of acute infection at this time WBC 9.7>> 18.8 >> 18.7 -Obtain UA and blood cultures,  --no antibiotics indicated at this time  7)Dysphagia/Swallowing concerns--- speech eval was requested, aspiration precautions  8)Right Hip Fracture--- discussed with orthopedic surgeon Dr. Arther Abbott plan is for ORIF on 05/17/2021 after further medical stabilization  9)Hypothyroidism--- continue levothyroxine 88 mcg daily, check TSH  10)Thrombocytosis --platelets appears to be close to  her baseline,  Follows with Dr. Delton Coombes -Continue hydroxyurea.  11) social/ethics--patient is a DNR/DNI plan of care discussed with patient's son Leslie Rosario at bedside  Disposition/Need for in-Hospital Stay- patient unable to be discharged at this time due to --hip fracture requiring ORIF after further medical stabilization given flash pulmonary edema and need for additional IV diuresis and close monitoring of electrolytes and  renal function*  Status is: Inpatient  Remains inpatient appropriate because:Please see disposition above   Disposition: The patient is from: ALF [facility]              Anticipated d/c is to: SNF              Anticipated d/c date is: > 3 days              Patient currently is not medically stable to d/c. Barriers: Not Clinically Stable-   Code Status :  -  Code Status: DNR   Family Communication:   Discussed with son Leslie Rosario  Consults  :  Ortho/speech  DVT Prophylaxis  :   - SCDs   SCDs Start: 05/14/21 2032    Lab Results  Component Value Date   PLT 847 (H) 05/15/2021    Inpatient Medications  Scheduled Meds: . chlorhexidine  60 mL Topical Once  . Chlorhexidine Gluconate Cloth  6 each Topical Daily  . povidone-iodine  2 application Topical Once   Continuous Infusions: .  ceFAZolin (ANCEF) IV    . phytonadione (VITAMIN K) IV Stopped (05/14/21 2227)   PRN Meds:.acetaminophen **OR** acetaminophen, morphine injection, ondansetron **OR** ondansetron (ZOFRAN) IV, polyethylene glycol    Anti-infectives (From admission, onward)   Start     Dose/Rate Route Frequency Ordered Stop   05/15/21 0600  ceFAZolin (ANCEF) IVPB 2g/100 mL premix        2 g 200 mL/hr over 30 Minutes Intravenous On call to O.R. 05/14/21 2315 05/16/21 0559        Objective:   Vitals:   05/15/21 0600 05/15/21 0700 05/15/21 0822 05/15/21 0900  BP: 135/70 118/61  (!) 150/71  Pulse: 86 77  86  Resp: 16 16  15   Temp:   98.6 F (37 C)   TempSrc:   Oral   SpO2: 98% 100%  100%    Wt Readings from Last 3 Encounters:  04/17/21 45.2 kg  02/20/21 46.8 kg  01/22/21 46.6 kg     Intake/Output Summary (Last 24 hours) at 05/15/2021 1322 Last data filed at 05/15/2021 0400 Gross per 24 hour  Intake 520 ml  Output 800 ml  Net -280 ml     Physical Exam  Gen:- Awake Alert,  In no apparent distress  HEENT:- [place].AT, No sclera icterus Neck-Supple Neck,No JVD,.  Lungs-fair air movement, no wheezing  CV-  S1, S2 normal, irregularly irregular  abd-  +ve B.Sounds, Abd Soft, No tenderness,    Extremity/Skin:- No  edema, pedal pulses present  Psych -underlying/baseline cognitive and memory deficits  neuro-generalized weakness without additional new focal deficits, no tremors MSK-limited right hip range of motion due to acute fracture, point tenderness on palpation over Rt hip area   Data Review:   Micro Results Recent Results (from the past 240 hour(s))  Culture, Urine     Status: Abnormal   Collection Time: 05/07/21 11:45 AM   Specimen: Urine, Random  Result Value Ref Range Status   Specimen Description   Final    URINE, RANDOM Performed at [facility], [address], [place],  [place] [address]    Special Requests   Final    NONE Performed at [facility], [address] [place], [place] [address]    Culture MULTIPLE SPECIES PRESENT, SUGGEST RECOLLECTION (A)  Final   Report Status 05/09/2021 FINAL  Final  Resp Panel by RT-PCR (Flu A&B, Covid) Nasopharyngeal Swab     Status: None   Collection Time: 05/14/21  6:24 PM   Specimen: Nasopharyngeal Swab; Nasopharyngeal(NP) swabs in vial transport medium  Result Value Ref Range Status   SARS Coronavirus 2 by RT PCR NEGATIVE NEGATIVE Final    Comment: (NOTE) SARS-CoV-2 target nucleic acids are NOT DETECTED.  The SARS-CoV-2 RNA is generally detectable in upper respiratory specimens during the acute phase of infection. The lowest concentration of SARS-CoV-2 viral copies this assay can detect is 138 copies/mL. A negative result does not preclude SARS-Cov-2 infection and should not be used as the sole basis for treatment or other patient management decisions. A negative result may occur with  improper specimen collection/handling, submission of specimen other than nasopharyngeal swab, presence of viral mutation(s) within the areas targeted by this assay, and inadequate number of viral copies(<138 copies/mL). A negative result must be  combined with clinical observations, patient history, and epidemiological information. The expected result is Negative.  Fact Sheet for Patients:  EntrepreneurPulse.com.au  Fact Sheet for Healthcare Providers:  IncredibleEmployment.be  This test is no t yet approved or cleared by the [place] FDA and  has been authorized for detection and/or diagnosis of SARS-CoV-2 by FDA under an Emergency Use Authorization (EUA). This EUA will remain  in effect (meaning this test can be used) for the duration of the COVID-19 declaration under Section 564(b)(1) of the Act, 21 U.S.C.section 360bbb-3(b)(1), unless the authorization is terminated  or revoked sooner.       Influenza A by PCR NEGATIVE NEGATIVE Final   Influenza B by PCR NEGATIVE NEGATIVE Final    Comment: (NOTE) The Xpert Xpress SARS-CoV-2/FLU/RSV plus assay is intended as an aid in the diagnosis of influenza from Nasopharyngeal swab specimens and should not be used as a sole basis for treatment. Nasal washings and aspirates are unacceptable for Xpert Xpress SARS-CoV-2/FLU/RSV testing.  Fact Sheet for Patients: EntrepreneurPulse.com.au  Fact Sheet for Healthcare Providers: IncredibleEmployment.be  This test is not yet approved or cleared by the [place] FDA and has been authorized for detection and/or diagnosis of SARS-CoV-2 by FDA under an Emergency Use Authorization (EUA). This EUA will remain in effect (meaning this test can be used) for the duration of the COVID-19 declaration under Section 564(b)(1) of the Act, 21 U.S.C. section 360bbb-3(b)(1), unless the authorization is terminated or revoked.  Performed at [facility], [address] [place], [place] [address]   MRSA PCR Screening     Status: None   Collection Time: 05/14/21 10:58 PM   Specimen: Nasal Mucosa; Nasopharyngeal  Result Value Ref Range Status   MRSA by PCR NEGATIVE NEGATIVE  Final    Comment:        The GeneXpert MRSA Assay (FDA approved for NASAL specimens only), is one component of a comprehensive MRSA colonization surveillance program. It is not intended to diagnose MRSA infection nor to guide or monitor treatment for MRSA infections. Performed at [facility], [address], [place],  [address]     Radiology Reports DG Chest 1 View  Result Date: 05/14/2021 CLINICAL DATA:  Fall, hip pain EXAM: CHEST  1 VIEW COMPARISON:  11/12/2018 FINDINGS: Heart is borderline in size.  Peribronchial thickening, vascular congestion and interstitial prominence. This could reflect interstitial edema. No confluent opacities or effusions. IMPRESSION: Bronchitic changes. Suspect mild vascular congestion and interstitial edema. Electronically Signed   By: Rolm Baptise M.D.   On: 05/14/2021 18:19   CT HEAD WO CONTRAST  Result Date: 05/14/2021 CLINICAL DATA:  87 year old female with encephalopathy. EXAM: CT HEAD WITHOUT CONTRAST TECHNIQUE: Contiguous axial images were obtained from the base of the skull through the vertex without intravenous contrast. COMPARISON:  Head CT dated 05/14/2021. FINDINGS: Brain: Moderate age-related atrophy and chronic microvascular ischemic changes. There is no acute intracranial hemorrhage. No mass effect or midline shift. No extra-axial fluid collection. Vascular: No hyperdense vessel or unexpected calcification. Skull: Normal. Negative for fracture or focal lesion. Sinuses/Orbits: Right maxillary sinus retention cyst or polyp. The remainder of the visualized paranasal sinuses and mastoid air cells are clear. Other: None IMPRESSION: 1. No acute intracranial pathology. 2. Moderate age-related atrophy and chronic microvascular ischemic changes. Electronically Signed   By: Anner Crete M.D.   On: 05/14/2021 23:01   CT Head Wo Contrast  Result Date: 05/14/2021 CLINICAL DATA:  Status post fall out of bed.  Facial laceration. EXAM: CT HEAD WITHOUT  CONTRAST CT CERVICAL SPINE WITHOUT CONTRAST TECHNIQUE: Multidetector CT imaging of the head and cervical spine was performed following the standard protocol without intravenous contrast. Multiplanar CT image reconstructions of the cervical spine were also generated. COMPARISON:  Head CT 09/12/2017.  CT neck 10/10/2016. FINDINGS: CT HEAD FINDINGS Brain: No evidence of acute infarction, hemorrhage, hydrocephalus, extra-axial collection or mass lesion/mass effect. Vascular: No hyperdense vessel or unexpected calcification. Skull: Intact.  No focal lesion. Sinuses/Orbits: Wall and mucosal thickening in the right maxillary sinus consistent with chronic sinus disease appear unchanged. Mild ethmoid air cell disease on left noted. Other: None. CT CERVICAL SPINE FINDINGS Alignment: Normal. Skull base and vertebrae: No acute fracture. No primary bone lesion or focal pathologic process. Soft tissues and spinal canal: No prevertebral fluid or swelling. No visible canal hematoma. Disc levels: Mild multilevel loss of disc space height and facet arthropathy noted. Upper chest: There is interlobular septal thickening and ground-glass attenuation in the lung apices. Other: None. IMPRESSION: No acute abnormality head or cervical spine. Appearance of the lung apices suggestive of interstitial edema. Electronically Signed   By: Inge Rise M.D.   On: 05/14/2021 17:54   CT Cervical Spine Wo Contrast  Result Date: 05/14/2021 CLINICAL DATA:  Status post fall out of bed.  Facial laceration. EXAM: CT HEAD WITHOUT CONTRAST CT CERVICAL SPINE WITHOUT CONTRAST TECHNIQUE: Multidetector CT imaging of the head and cervical spine was performed following the standard protocol without intravenous contrast. Multiplanar CT image reconstructions of the cervical spine were also generated. COMPARISON:  Head CT 09/12/2017.  CT neck 10/10/2016. FINDINGS: CT HEAD FINDINGS Brain: No evidence of acute infarction, hemorrhage, hydrocephalus, extra-axial  collection or mass lesion/mass effect. Vascular: No hyperdense vessel or unexpected calcification. Skull: Intact.  No focal lesion. Sinuses/Orbits: Wall and mucosal thickening in the right maxillary sinus consistent with chronic sinus disease appear unchanged. Mild ethmoid air cell disease on left noted. Other: None. CT CERVICAL SPINE FINDINGS Alignment: Normal. Skull base and vertebrae: No acute fracture. No primary bone lesion or focal pathologic process. Soft tissues and spinal canal: No prevertebral fluid or swelling. No visible canal hematoma. Disc levels: Mild multilevel loss of disc space height and facet arthropathy noted. Upper chest: There is interlobular septal thickening and ground-glass attenuation in the lung apices. Other: None.  IMPRESSION: No acute abnormality head or cervical spine. Appearance of the lung apices suggestive of interstitial edema. Electronically Signed   By: Inge Rise M.D.   On: 05/14/2021 17:54   DG CHEST PORT 1 VIEW  Result Date: 05/14/2021 CLINICAL DATA:  Unresponsive EXAM: PORTABLE CHEST 1 VIEW COMPARISON:  05/14/2021 FINDINGS: Cardiac shadow is stable. Aortic calcifications are again seen. Lungs are well aerated bilaterally. Increasing interstitial edema is noted. No confluent infiltrate or effusion is noted. Changes of prior vertebral augmentation are again seen. IMPRESSION: Increasing pulmonary edema. Electronically Signed   By: Inez Catalina M.D.   On: 05/14/2021 23:18   DG Hip Unilat With Pelvis 2-3 Views Right  Result Date: 05/14/2021 CLINICAL DATA:  Fall, right hip pain EXAM: DG HIP (WITH OR WITHOUT PELVIS) 2-3V RIGHT COMPARISON:  07/18/2012 FINDINGS: There is a right femoral neck fracture noted. No subluxation or dislocation. Slight impaction. Early degenerative changes in the hips bilaterally. IMPRESSION: Right femoral neck fracture. Electronically Signed   By: Rolm Baptise M.D.   On: 05/14/2021 18:16     CBC Recent Labs  Lab 05/14/21 1721  05/14/21 2259 05/15/21 0440  WBC 9.7 18.8* 18.7*  HGB 11.5* 12.2 12.3  HCT 35.3* 37.9 37.3  PLT 865* 851* 847*  MCV 102.3* 101.3* 99.7  MCH 33.3 32.6 32.9  MCHC 32.6 32.2 33.0  RDW 17.1* 16.9* 16.7*  LYMPHSABS 1.6 2.0  --   MONOABS 0.8 1.2*  --   EOSABS 0.4 0.2  --   BASOSABS 0.0 0.1  --     Chemistries  Recent Labs  Lab 05/14/21 1721 05/14/21 2259 05/15/21 0440  NA 136 136 137  K 4.9 4.3 4.5  CL 101 102 101  CO2 28 23 25   GLUCOSE 92 164* 155*  BUN 43* 40* 39*  CREATININE 2.07* 1.78* 1.69*  CALCIUM 8.9 8.6* 9.0   ------------------------------------------------------------------------------------------------------------------ No results for input(s): CHOL, HDL, LDLCALC, TRIG, CHOLHDL, LDLDIRECT in the last 72 hours.  Lab Results  Component Value Date   HGBA1C 5.7 (H) 09/13/2017   ------------------------------------------------------------------------------------------------------------------ No results for input(s): TSH, T4TOTAL, T3FREE, THYROIDAB in the last 72 hours.  Invalid input(s): FREET3 ------------------------------------------------------------------------------------------------------------------ No results for input(s): VITAMINB12, FOLATE, FERRITIN, TIBC, IRON, RETICCTPCT in the last 72 hours.  Coagulation profile Recent Labs  Lab 05/14/21 1721 05/15/21 0440  INR 2.7* 2.3*    No results for input(s): DDIMER in the last 72 hours.  Cardiac Enzymes No results for input(s): CKMB, TROPONINI, MYOGLOBIN in the last 168 hours.  Invalid input(s): CK ------------------------------------------------------------------------------------------------------------------    Component Value Date/Time   BNP 238.0 (H) 09/12/2017 0354     Roxan Hockey M.D on 05/15/2021 at 1:22 PM  Go to [email or web address] - for contact info  [facility] - Office  [phone]

## 2021-05-15 NOTE — Progress Notes (Signed)
  Echocardiogram 2D Echocardiogram has been performed.  Leslie Rosario 05/15/2021, 2:35 PM

## 2021-05-15 NOTE — Consult Note (Signed)
Consultation Note Date: 05/15/2021   Patient Name: Leslie Rosario  DOB: [birth year]  MRN: [id number]  Age / Sex: 87 y.o., female  PCP: Celene Squibb, MD Referring Physician: Roxan Hockey, MD  Reason for Consultation: Establishing goals of care and Psychosocial/spiritual support  HPI/Patient Profile: 87 y.o. female  with past medical history of A. fib, mitral regurgitation, HTN, HLD, hypothyroid, T12 compression fracture with kyphoplasty 2019, anxiety/depression, OA, GERD admitted on 05/14/2021 with closed right hip fracture with planned repair for Friday 6/3.   Clinical Assessment and Goals of Care: Leslie Rosario is lying quietly in bed.  She appears acutely/chronically ill and quite frail.  She will briefly make but not keep eye contact.  She is able to tell me her name, but mumbles when asked where we are.  She denies pain or issues at this time.  Present today at bedside is son, [name].  He seems knowledgeable about the treatment plan.  We talked about healthcare power of attorney.  [name] states that Richardson Landry and TM are the main contact person's/decision makers.    HCPOA  NEXT OF KIN -Leslie Rosario has 3 sons.  Richardson Landry is the main Media planner along with [name].  Son, [name] shares this.    SUMMARY OF RECOMMENDATIONS   At this point continue to treat the treatable but no CPR or intubation. Plan for right hip repair Friday, 6/3 Anticipate need for short-term rehab   Code Status/Advance Care Planning:  DNR  Symptom Management:   Per hospitalist/orthopedist, no additional needs at this time.  Palliative Prophylaxis:   Frequent Pain Assessment, Oral Care and Turn Reposition  Additional Recommendations (Limitations, Scope, Preferences):  Continue to treat the treatable but no CPR or intubation.  Agreeable to surgical hip repair  Psycho-social/Spiritual:   Desire for further Chaplaincy  support:no  Additional Recommendations: Caregiving  Support/Resources  Prognosis:   Unable to determine, based on outcomes.  Due to advanced age, hip fracture, anticipated decline in functional status, 6 months or less would not be surprising.  Discharge Planning: To be determined, based on outcomes.  Anticipate need for short-term rehab      Primary Diagnoses: Present on Admission: . Closed right hip fracture ([place]) . Atrial fibrillation ([place]) . AMS (altered mental status) . Essential hypertension . Hypothyroidism   I have reviewed the medical record, interviewed the patient and family, and examined the patient. The following aspects are pertinent.  Past Medical History:  Diagnosis Date  . Anxiety   . Arthritis   . Atrial fibrillation ([place])   . Constipation   . Depression   . Dyspnea   . Dysrhythmia    A-Fib  . GERD (gastroesophageal reflux disease)   . Hyperlipidemia   . Hypertension   . Hypothyroidism   . Nausea & vomiting   . T12 compression fracture ([place]) 06/2017  . Urinary frequency    Social History   Socioeconomic History  . Marital status: Widowed    Spouse name: Not on file  . Number of children: 3  .  Years of education: Not on file  . Highest education level: Not on file  Occupational History    Employer: RETIRED  Tobacco Use  . Smoking status: Former Smoker    Quit date: 01/10/1985    Years since quitting: 36.3  . Smokeless tobacco: Never Used  . Tobacco comment: never inhaled  Vaping Use  . Vaping Use: Never used  Substance and Sexual Activity  . Alcohol use: No    Alcohol/week: 0.0 standard drinks  . Drug use: No  . Sexual activity: Not Currently  Other Topics Concern  . Not on file  Social History Narrative  . Not on file   Social Determinants of Health   Financial Resource Strain: Low Risk   . Difficulty of Paying Living Expenses: Not hard at all  Food Insecurity: No Food Insecurity  . Worried About Charity fundraiser in the  Last Year: Never true  . Ran Out of Food in the Last Year: Never true  Transportation Needs: No Transportation Needs  . Lack of Transportation (Medical): No  . Lack of Transportation (Non-Medical): No  Physical Activity: Inactive  . Days of Exercise per Week: 0 days  . Minutes of Exercise per Session: 0 min  Stress: No Stress Concern Present  . Feeling of Stress : Not at all  Social Connections: Socially Isolated  . Frequency of Communication with Friends and Family: More than three times a week  . Frequency of Social Gatherings with Friends and Family: Three times a week  . Attends Religious Services: Never  . Active Member of Clubs or Organizations: No  . Attends Archivist Meetings: Never  . Marital Status: Widowed   Family History  Problem Relation Age of Onset  . Cancer Mother   . Ovarian cancer Mother   . Cancer Father   . Cancer Brother   . Emphysema Brother   . Emphysema Brother   . Pneumonia Brother   . Aneurysm Brother   . Healthy Son   . Sleep apnea Son   . Aortic aneurysm Other   . Kidney cancer Son    Scheduled Meds: . chlorhexidine  60 mL Topical Once  . Chlorhexidine Gluconate Cloth  6 each Topical Daily  . DULoxetine  30 mg Oral Daily  . furosemide  20 mg Intravenous Daily  . [START ON 05/16/2021] hydroxyurea  500 mg Oral Q T,Th,S,Su  . [START ON 05/16/2021] levothyroxine  88 mcg Oral Q0600  . metoprolol tartrate  25 mg Oral BID  . pantoprazole  40 mg Oral Daily  . povidone-iodine  2 application Topical Once   Continuous Infusions: .  ceFAZolin (ANCEF) IV    . phytonadione (VITAMIN K) IV Stopped (05/14/21 2227)   PRN Meds:.acetaminophen **OR** acetaminophen, ALPRAZolam, morphine injection, ondansetron **OR** ondansetron (ZOFRAN) IV, oxyCODONE, polyethylene glycol Medications Prior to Admission:  Prior to Admission medications   Medication Sig Start Date End Date Taking? Authorizing Provider  ALPRAZolam Duanne Moron) 0.5 MG tablet Take 1 tablet (0.5  mg total) by mouth at bedtime as needed for anxiety or sleep. 11/14/18  Yes Barton Dubois, MD  bisacodyl (DULCOLAX) 10 MG suppository Place 10 mg rectally every three (3) days as needed for moderate constipation.   Yes [provider]  DULoxetine (CYMBALTA) 20 MG capsule Take 20 mg by mouth daily.   Yes [provider]  feeding supplement, ENSURE ENLIVE, (ENSURE ENLIVE) LIQD Take 237 mLs by mouth 2 (two) times daily between meals. Patient taking differently: Take 237  mLs by mouth daily. 11/14/18  Yes Barton Dubois, MD  furosemide (LASIX) 20 MG tablet Take 20 mg by mouth daily. 11/25/19  Yes [provider]  HYDROcodone-acetaminophen (NORCO/VICODIN) 5-325 MG tablet Take 1 tablet by mouth 2 (two) times daily as needed. 12/26/20  Yes [provider]  levothyroxine (SYNTHROID) 88 MCG tablet Take 88 mcg by mouth daily. 04/10/21  Yes [provider]  loratadine (CLARITIN) 5 MG chewable tablet [id number]  loratadine 5 mg tablet Administer 1 tab(s) By mouth once a day 11/25/19  Yes [provider]  Magnesium Oxide -Mg Supplement 250 MG TABS Take 1 tablet by mouth at bedtime.   Yes [provider]  pantoprazole (PROTONIX) 40 MG tablet Take 40 mg by mouth daily. 10/18/20  Yes [provider]  polyethylene glycol (MIRALAX / GLYCOLAX) 17 g packet Take 17 g by mouth daily as needed.   Yes [provider]  potassium chloride (MICRO-K) 10 MEQ CR capsule Take 10 mEq by mouth daily. 10/18/20  Yes [provider]  simvastatin (ZOCOR) 10 MG tablet Take 10 mg by mouth at bedtime. 10/18/20  Yes [provider]  sucralfate (CARAFATE) 1 g tablet Take 1 g by mouth 4 (four) times daily. 11/25/19  Yes [provider]  warfarin (COUMADIN) 3 MG tablet Take 3 mg by mouth daily. Every Mon, Wed, Fri, Sat   Yes [provider]  warfarin (COUMADIN) 6 MG tablet Take 6 mg by mouth. Every Aris Everts 10/18/20  Yes [provider]  hydroxyurea (HYDREA) 500 MG capsule Take 1 capsule (500 mg total) by mouth every other day. May take with food to minimize GI side effects. Patient not taking: No sig reported 02/20/21   Harriett Rush, PA-C  prochlorperazine (COMPAZINE) 5 MG tablet Take 1 tablet (5 mg total) by mouth every 6 (six) hours as needed for nausea or vomiting. Patient not taking: No sig reported 12/19/20   Derek Jack, MD   Allergies  Allergen Reactions  . Adhesive [Tape] Other (See Comments)    REACTION: pulls skin  . Codeine     UNKNOWN REACTION  . Prednisone     UNKNOWN  . Sulfonamide Derivatives     UNKNOWN REACTION  . Vicodin [Hydrocodone-Acetaminophen]     "Intolerance "   Review of Systems  Unable to perform ROS: Age    Physical Exam Vitals and nursing note reviewed.  Constitutional:      General: She is not in acute distress.    Appearance: She is ill-appearing.  HENT:     Mouth/Throat:     Mouth: Mucous membranes are moist.  Cardiovascular:     Rate and Rhythm: Normal rate.  Pulmonary:     Effort: No respiratory distress.  Skin:    General: Skin is warm and dry.  Neurological:     Comments: Oriented to person  Psychiatric:     Comments: Calm and cooperative, not fearful     Vital Signs: BP (!) 153/78 (BP Location: Right Arm)   Pulse 86   Temp 99.2 F (37.3 C) (Axillary)   Resp 19   SpO2 96%  Pain Scale: PAINAD       SpO2: SpO2: 96 % O2 Device:SpO2: 96 % O2 Flow Rate: .O2 Flow Rate (L/min): 3 L/min  IO: Intake/output summary:   Intake/Output Summary (Last 24 hours) at 05/15/2021 1636 Last data filed at 05/15/2021 0400 Gross per 24 hour  Intake 520 ml  Output 800 ml  Net -  280 ml    LBM:   Baseline Weight:   Most recent weight:       Palliative Assessment/Data:   Flowsheet Rows   Flowsheet Row Most Recent Value  Intake Tab   Referral Department Hospitalist  Unit at Time of Referral Cardiac/Telemetry Unit  Palliative Care Primary  Diagnosis Trauma  Date Notified 05/15/21  Palliative Care Type New Palliative care  Reason for referral Clarify Goals of Care  Date of Admission 05/14/21  Date first seen by Palliative Care 05/15/21  # of days Palliative referral response time 0 Day(s)  # of days IP prior to Palliative referral 1  Clinical Assessment   Palliative Performance Scale Score 20%  Pain Max last 24 hours Not able to report  Pain Min Last 24 hours Not able to report  Dyspnea Max Last 24 Hours Not able to report  Dyspnea Min Last 24 hours Not able to report  Psychosocial & Spiritual Assessment   Palliative Care Outcomes       Time In: 1520 Time Out: 1610 Time Total: 50 minutes  Greater than 50%  of this time was spent counseling and coordinating care related to the above assessment and plan.  Signed by: Drue Novel, NP   Please contact Palliative Medicine Team phone at [phone] for questions and concerns.  For individual provider: See Shea Evans

## 2021-05-15 NOTE — Evaluation (Signed)
Clinical/Bedside Swallow Evaluation Patient Details  Name: Leslie Rosario MRN: [id number] Date of Birth: [birth year]  Today's Date: 05/15/2021 Time: SLP Start Time (ACUTE ONLY): 1444 SLP Stop Time (ACUTE ONLY): 1459 SLP Time Calculation (min) (ACUTE ONLY): 15 min  Past Medical History:  Past Medical History:  Diagnosis Date  . Anxiety   . Arthritis   . Atrial fibrillation ([place])   . Constipation   . Depression   . Dyspnea   . Dysrhythmia    A-Fib  . GERD (gastroesophageal reflux disease)   . Hyperlipidemia   . Hypertension   . Hypothyroidism   . Nausea & vomiting   . T12 compression fracture ([place]) 06/2017  . Urinary frequency    Past Surgical History:  Past Surgical History:  Procedure Laterality Date  . COLONOSCOPY    . KYPHOPLASTY N/A 06/30/2018   Procedure: KYPHOPLASTY L1 and T12;  Surgeon: Melina Schools, MD;  Location: [facility];  Service: Orthopedics;  Laterality: N/A;  90 mins  . NM MYOCAR PERF WALL MOTION  04/2006   dipyridamole; normal pattern of perfusion to all regions, post-stress EF 73%  . TONSILLECTOMY    . TRANSTHORACIC ECHOCARDIOGRAM  04/2012   EF=>55%, borderline conc LVH; LA mod dilated; RA mildly dilated; mild mitral annular calcif, mod MR; mild-mod TR with elevated RSVP 30-68mmHg, mild pulm HTN; mild calcif of AV leaflets, AV mildly sclerotic; aortic root sclerosis/calcif  . UPPER GASTROINTESTINAL ENDOSCOPY     HPI:  87 y.o. female with medical history significant for atrial fibrillation on coumadin, mitral regurgitation, HTN admitted on 05/14/2021 with Right Hip Fx after a mechanical fall, subsequently after 10 PM on 05/14/2020 developed unresponsive episode presumably after getting fentanyl in the ED previously, was found to be in flash pulmonary edema responded well to IV Lasix. Dr. Aline Brochure plan is for ORIF on 05/17/21 after further medical stabilization. RN reports "choking" episode this am with water. BSE requested   Assessment / Plan / Recommendation Clinical  Impression  Clinical swallowing evaluation completed after SLP and RN repositioned Pt to be sitting upright; Pt reported discomfort and wanted to lay back down but was agreeable to participate in BSE first. Pt consumed ice chips and thin liquids with poor oral awareness, wet vocal quality, delayed throat clearing and cued cough was very weak. With NTL trials note no wet vocal quality and seemingly improved oral control of bolus. Pt consumed puree trials without incident; she consumed trials of regular (graham crackers) with prolonged oral prep and mild oral residue after the swallow. Recommend initiate D2/fine chop diet (discussed solid diet initiation- Pt currently on clear liquids-- with attending, Dr. Joesph Fillers), and downgrade liquids to NECTAR thick liquids. Further recommend meds be administered whole with applesauce or crushed with applesauce. Please ensure Pt is sitting upright for all PO (despite hip discomfort) and that she is alert for all PO. ST will continue to follow acutely, thank you SLP Visit Diagnosis: Dysphagia, unspecified (R13.10)    Aspiration Risk  Mild aspiration risk    Diet Recommendation Dysphagia 2 (Fine chop);Nectar-thick liquid   Liquid Administration via: Cup;Straw Medication Administration: Whole meds with puree Supervision: Full supervision/cueing for compensatory strategies;Staff to assist with self feeding Compensations: Slow rate;Small sips/bites Postural Changes: Seated upright at 90 degrees    Other  Recommendations Oral Care Recommendations: Oral care BID Other Recommendations: Order thickener from pharmacy;Remove water pitcher;Clarify dietary restrictions   Follow up Recommendations 24 hour supervision/assistance      Frequency and Duration min 2x/week  1  week       Prognosis Prognosis for Safe Diet Advancement: Good      Swallow Study   General Date of Onset: 05/14/21 HPI: 87 y.o. female with medical history significant for atrial fibrillation on  coumadin, mitral regurgitation, HTN admitted on 05/14/2021 with Right Hip Fx after a mechanical fall, subsequently after 10 PM on 05/14/2020 developed unresponsive episode presumably after getting fentanyl in the ED previously, was found to be in flash pulmonary edema responded well to IV Lasix. Dr. Aline Brochure plan is for ORIF on 05/17/21 after further medical stabilization. RN reports "choking" episode this am with water. BSE requested Type of Study: Bedside Swallow Evaluation Previous Swallow Assessment: none in chart Diet Prior to this Study: Thin liquids Temperature Spikes Noted: No Respiratory Status: Nasal cannula History of Recent Intubation: No Behavior/Cognition: Alert;Cooperative;Pleasant mood Oral Cavity Assessment: Within Functional Limits Oral Care Completed by SLP: Recent completion by staff Oral Cavity - Dentition: Adequate natural dentition Vision: Functional for self-feeding Self-Feeding Abilities: Able to feed self Patient Positioning: Upright in bed Baseline Vocal Quality: Normal Volitional Cough: Weak Volitional Swallow: Able to elicit    Oral/Motor/Sensory Function Overall Oral Motor/Sensory Function: Within functional limits   Ice Chips Ice chips: Impaired Presentation: Cup Oral Phase Impairments: Poor awareness of bolus Oral Phase Functional Implications: Left anterior spillage;Prolonged oral transit Pharyngeal Phase Impairments: Multiple swallows;Wet Vocal Quality   Thin Liquid Thin Liquid: Impaired Presentation: Spoon;Straw;Cup Oral Phase Impairments: Poor awareness of bolus Oral Phase Functional Implications: Left anterior spillage Pharyngeal  Phase Impairments: Throat Clearing - Delayed;Wet Vocal Quality;Multiple swallows    Nectar Thick Nectar Thick Liquid: Within functional limits   Honey Thick Honey Thick Liquid: Not tested   Puree Puree: Within functional limits   Solid     Solid: Within functional limits     [name] H. Roddie Mc, CCC-SLP Speech  Language Pathologist  [name] 05/15/2021,4:15 PM

## 2021-05-16 ENCOUNTER — Inpatient Hospital Stay (HOSPITAL_COMMUNITY): Payer: Medicare Other

## 2021-05-16 ENCOUNTER — Ambulatory Visit (HOSPITAL_COMMUNITY): Payer: Medicare Other | Admitting: Physician Assistant

## 2021-05-16 DIAGNOSIS — Z66 Do not resuscitate: Secondary | ICD-10-CM

## 2021-05-16 DIAGNOSIS — S72001A Fracture of unspecified part of neck of right femur, initial encounter for closed fracture: Secondary | ICD-10-CM

## 2021-05-16 DIAGNOSIS — G9341 Metabolic encephalopathy: Secondary | ICD-10-CM

## 2021-05-16 LAB — BASIC METABOLIC PANEL
Anion gap: 12 (ref 5–15)
BUN: 47 mg/dL — ABNORMAL HIGH (ref 8–23)
CO2: 26 mmol/L (ref 22–32)
Calcium: 9 mg/dL (ref 8.9–10.3)
Chloride: 102 mmol/L (ref 98–111)
Creatinine, Ser: 1.63 mg/dL — ABNORMAL HIGH (ref 0.44–1.00)
GFR, Estimated: 29 mL/min — ABNORMAL LOW (ref >60)
Glucose, Bld: 117 mg/dL — ABNORMAL HIGH (ref 70–99)
Potassium: 4 mmol/L (ref 3.5–5.1)
Sodium: 140 mmol/L (ref 135–145)

## 2021-05-16 LAB — CBC
HCT: 34.9 % — ABNORMAL LOW (ref 36.0–46.0)
Hemoglobin: 11.4 g/dL — ABNORMAL LOW (ref 12.0–15.0)
MCH: 32.8 pg (ref 26.0–34.0)
MCHC: 32.7 g/dL (ref 30.0–36.0)
MCV: 100.3 fL — ABNORMAL HIGH (ref 80.0–100.0)
Platelets: 744 10*3/uL — ABNORMAL HIGH (ref 150–400)
RBC: 3.48 MIL/uL — ABNORMAL LOW (ref 3.87–5.11)
RDW: 16.9 % — ABNORMAL HIGH (ref 11.5–15.5)
WBC: 12.1 10*3/uL — ABNORMAL HIGH (ref 4.0–10.5)
nRBC: 0 % (ref 0.0–0.2)

## 2021-05-16 LAB — GLUCOSE, CAPILLARY: Glucose-Capillary: 117 mg/dL — ABNORMAL HIGH (ref 70–99)

## 2021-05-16 LAB — PROTIME-INR
INR: 1.6 — ABNORMAL HIGH (ref 0.8–1.2)
Prothrombin Time: 18.7 seconds — ABNORMAL HIGH (ref 11.4–15.2)

## 2021-05-16 LAB — TSH: TSH: 0.935 u[IU]/mL (ref 0.350–4.500)

## 2021-05-16 MED ORDER — CEFAZOLIN SODIUM-DEXTROSE 2-4 GM/100ML-% IV SOLN
2.0000 g | INTRAVENOUS | Status: AC
  Administered 2021-05-17: 2 g via INTRAVENOUS
  Filled 2021-05-16: qty 100

## 2021-05-16 MED ORDER — POVIDONE-IODINE 10 % EX SWAB: 2.0000 "application " | Freq: Once | CUTANEOUS | Status: DC

## 2021-05-16 MED ORDER — CHLORHEXIDINE GLUCONATE 4 % EX LIQD
60.0000 mL | Freq: Once | CUTANEOUS | Status: DC
  Filled 2021-05-16: qty 30

## 2021-05-16 NOTE — Progress Notes (Signed)
  Speech Language Pathology Treatment: Dysphagia  Patient Details Name: Leslie Rosario MRN: [id number] DOB: 1923-10-05 Today's Date: 05/16/2021 Time: 4801-6553 SLP Time Calculation (min) (ACUTE ONLY): 24 min  Assessment / Plan / Recommendation Clinical Impression  Pt seen for ongoing dysphagia intervention following clinical swallow evaluation completed yesterday. Pt alert, but confused. She demonstrated immediate cough with straw sips NTL and reduced oral control with cup sips of NTL. Pt with improved performance with teaspoon presentations of NTL and cup sips of HTL, puree is WNL. Pt is scheduled for hip repair surgery tomorrow and is at high risk for aspiration given recent medical decline and current hip fracture. SLP spoke with RN regarding recommendations for teaspoon presentations of NTL and she is in agreement. SLP will follow after hip surgery depending on when it is completed tomorrow.    HPI HPI: 87 y.o. female with medical history significant for atrial fibrillation on coumadin, mitral regurgitation, HTN admitted on 05/14/2021 with Right Hip Fx after a mechanical fall, subsequently after 10 PM on 05/14/2020 developed unresponsive episode presumably after getting fentanyl in the ED previously, was found to be in flash pulmonary edema responded well to IV Lasix. Dr. Aline Brochure plan is for ORIF on 05/17/21 after further medical stabilization. RN reports "choking" episode this am with water. BSE requested      SLP Plan  Continue with current plan of care       Recommendations  Diet recommendations: Dysphagia 2 (fine chop);Nectar-thick liquid Liquids provided via: Teaspoon;No straw Medication Administration: Whole meds with puree Supervision: Staff to assist with self feeding;Full supervision/cueing for compensatory strategies Compensations: Slow rate;Small sips/bites Postural Changes and/or Swallow Maneuvers: Seated upright 90 degrees;Upright 30-60 min after meal                Oral  Care Recommendations: Oral care BID Follow up Recommendations: 24 hour supervision/assistance SLP Visit Diagnosis: Dysphagia, unspecified (R13.10) Plan: Continue with current plan of care       Thank you,  Genene Churn, [place]                 [place] 05/16/2021, 1:02 PM

## 2021-05-16 NOTE — Progress Notes (Signed)
Patient more alert, able to answer my questions. Patient was able to tolerate taking her medication. Will continue to monitor.

## 2021-05-16 NOTE — Progress Notes (Signed)
Patient ID: Leslie Rosario, female   DOB: Sep 10, 1923, 87 y.o.   MRN: [id number] Surgery planned open treatment internal fixation right hip    The procedure has been fully reviewed with the patients son Mr Ruggles ; The risks and benefits of surgery have been discussed and explained and understood. Alternative treatment has also been reviewed, questions were encouraged and answered. The postoperative plan is also been reviewed.

## 2021-05-16 NOTE — Progress Notes (Signed)
Patient Demographics:    Leslie Rosario, is a 87 y.o. female, DOB - 12-21-1922, IAX:[id number]  Admit date - 05/14/2021   Admitting Physician Ejiroghene Arlyce Dice, MD  Outpatient Primary MD for the patient is Celene Squibb, MD  LOS - 2   Chief Complaint  Patient presents with  . Fall        Subjective:    Leslie Rosario today has no fevers, no emesis,  No chest pain,   -Patient is more awake, more interactive, son [name] is at bedside  Assessment  & Plan :    Principal Problem:   Closed right hip fracture [facility]) Active Problems:   Atrial fibrillation ([place])   Long term current use of anticoagulant therapy   Acute metabolic encephalopathy   Dysphagia   Hypothyroidism   Essential hypertension   AMS (altered mental status)  Brief Summary:- 87 y.o. female with medical history significant for atrial fibrillation on coumadin, mitral regurgitation, HTN admitted on 05/14/2021 with Right Hip Fx after a mechanical fall, subsequently after 10 PM on 05/14/2020 developed unresponsive episode presumably after getting fentanyl in the ED previously, was found to be in flash pulmonary edema responded well to IV Lasix   A/p 1) acute metabolic encephalopathy--- unresponsive episode after 10 PM on 05/14/2021--- more awake, more alert, more interactive CT head and CT C-spine without acute findings-- -Suspect this was related to IV fentanyl and transient hemodynamic compromise -According to patient's son who is at bedside patient appears back to baseline  2)HFpEF--- patient appears to have chronic diastolic dysfunction CHF,  --echo from 09/13/2017 with EF of 60 to 65%, on 05/14/2021 patient had "flash pulmonary edema" after 10 PM after receiving IV fentanyl previously in the ED -- please see chest x-ray report, she responded well to IV Lasix -Continue Lasix -Updated echo requested -Repeat chest x-ray on 05/16/2021 shows resolving  edema -Monitor fluid intake and output and daily weight carefully especially given underlying CKD 4  3) chronic atrial fibrillation--hold Coumadin to allow for hip surgery, INR currently down to 1.6, metoprolol 25 mg p.o. twice daily for rate control  4) chronic anticoagulation--- in the setting of chronic A. fib, continue to hold Coumadin to allow for right hip surgery,  -INR 1.6  5)AKI----acute kidney injury on CKD stage -iV   creatinine on admission= 2.07  , baseline creatinine =  1.7 to 1.8   ,  -creatinine is now=  1.63 (renal function appears to be back to baseline) - renally adjust medications, avoid nephrotoxic agents / dehydration  / hypotension --Monitor fluid intake and output and daily weight carefully especially given underlying CKD 4  6)Leukocytosis-- ??  Reactive secondary to hip fracture and flash pulmonary edema -No fevers or other evidence of acute infection at this time WBC 9.7>> 18.8 >> 18.7>> 12.1 Urine and blood cultures- NGTD --no antibiotics indicated at this time  7)Dysphagia/Swallowing concerns--- speech eval was requested, aspiration precautions  8)Right Hip Fracture--- discussed with orthopedic surgeon Dr. Arther Abbott plan is for ORIF on 05/17/2021 after further medical stabilization  9)Hypothyroidism--- continue levothyroxine 88 mcg daily,  TSH 0.935  10)Thrombocytosis --platelets appears to be close to her baseline,  Follows with Dr. Delton Coombes -Continue hydroxyurea.  11) social/ethics--patient is a DNR/DNI plan of  care discussed with patient's son [name] at bedside  Disposition/Need for in-Hospital Stay- patient unable to be discharged at this time due to --hip fracture requiring ORIF after further medical stabilization given flash pulmonary edema and need for additional IV diuresis and close monitoring of electrolytes and renal function*  Status is: Inpatient  Remains inpatient appropriate because:Please see disposition above   Disposition: The  patient is from: [facility]              Anticipated d/c is to: SNF              Anticipated d/c date is: > 3 days              Patient currently is not medically stable to d/c. Barriers: Not Clinically Stable-   Code Status :  -  Code Status: DNR   Family Communication:   Discussed with son [name]  Consults  :  Ortho/speech  DVT Prophylaxis  :   - SCDs   SCDs Start: 05/14/21 2032    Lab Results  Component Value Date   PLT 744 (H) 05/16/2021    Inpatient Medications  Scheduled Meds: . chlorhexidine  60 mL Topical Once  . chlorhexidine  60 mL Topical Once  . Chlorhexidine Gluconate Cloth  6 each Topical Daily  . DULoxetine  30 mg Oral Daily  . furosemide  20 mg Intravenous Daily  . hydroxyurea  500 mg Oral Q T,Th,S,Su  . levothyroxine  88 mcg Oral Q0600  . metoprolol tartrate  25 mg Oral BID  . pantoprazole  40 mg Oral Daily  . povidone-iodine  2 application Topical Once  . povidone-iodine  2 application Topical Once   Continuous Infusions: . [START ON 05/17/2021]  ceFAZolin (ANCEF) IV     PRN Meds:.acetaminophen **OR** acetaminophen, ALPRAZolam, morphine injection, ondansetron **OR** ondansetron (ZOFRAN) IV, oxyCODONE, polyethylene glycol    Anti-infectives (From admission, onward)   Start     Dose/Rate Route Frequency Ordered Stop   05/17/21 0600  ceFAZolin (ANCEF) IVPB 2g/100 mL premix        2 g 200 mL/hr over 30 Minutes Intravenous On call to O.R. 05/16/21 1259 05/18/21 0559   05/15/21 0600  ceFAZolin (ANCEF) IVPB 2g/100 mL premix        2 g 200 mL/hr over 30 Minutes Intravenous On call to O.R. 05/14/21 2315 05/16/21 0559        Objective:   Vitals:   05/15/21 0900 05/15/21 1624 05/15/21 2356 05/16/21 0457  BP: (!) 150/71 (!) 153/78 115/60 117/66  Pulse: 86 86 90 84  Resp: 15 19 14 14   Temp:  99.2 F (37.3 C) 98.3 F (36.8 C) 97.9 F (36.6 C)  TempSrc:  Axillary    SpO2: 100% 96% 93% 91%    Wt Readings from Last 3 Encounters:  04/17/21 45.2 kg   02/20/21 46.8 kg  01/22/21 46.6 kg     Intake/Output Summary (Last 24 hours) at 05/16/2021 1839 Last data filed at 05/16/2021 1756 Gross per 24 hour  Intake 290 ml  Output 350 ml  Net -60 ml     Physical Exam  Gen:- Awake Alert,  In no apparent distress  HEENT:- [place].AT, No sclera icterus Neck-Supple Neck,No JVD,.  Lungs-improved air movement, no rales, no wheezing CV- S1, S2 normal, irregularly irregular  abd-  +ve B.Sounds, Abd Soft, No tenderness,    Extremity/Skin:- No  edema, pedal pulses present  Psych -underlying/baseline cognitive and memory deficits  neuro-generalized weakness without  additional new focal deficits, no tremors MSK-limited right hip range of motion due to acute fracture, point tenderness on palpation over Rt hip area   Data Review:   Micro Results Recent Results (from the past 240 hour(s))  Culture, Urine     Status: Abnormal   Collection Time: 05/07/21 11:45 AM   Specimen: Urine, Random  Result Value Ref Range Status   Specimen Description   Final    URINE, RANDOM Performed at [facility], [address] [place], [place] [address]    Special Requests   Final    NONE Performed at [facility], [address], [place], [place] [address]    Culture MULTIPLE SPECIES PRESENT, SUGGEST RECOLLECTION (A)  Final   Report Status 05/09/2021 FINAL  Final  Resp Panel by RT-PCR (Flu A&B, Covid) Nasopharyngeal Swab     Status: None   Collection Time: 05/14/21  6:24 PM   Specimen: Nasopharyngeal Swab; Nasopharyngeal(NP) swabs in vial transport medium  Result Value Ref Range Status   SARS Coronavirus 2 by RT PCR NEGATIVE NEGATIVE Final    Comment: (NOTE) SARS-CoV-2 target nucleic acids are NOT DETECTED.  The SARS-CoV-2 RNA is generally detectable in upper respiratory specimens during the acute phase of infection. The lowest concentration of SARS-CoV-2 viral copies this assay can detect is 138 copies/mL. A negative result does not preclude  SARS-Cov-2 infection and should not be used as the sole basis for treatment or other patient management decisions. A negative result may occur with  improper specimen collection/handling, submission of specimen other than nasopharyngeal swab, presence of viral mutation(s) within the areas targeted by this assay, and inadequate number of viral copies(<138 copies/mL). A negative result must be combined with clinical observations, patient history, and epidemiological information. The expected result is Negative.  Fact Sheet for Patients:  EntrepreneurPulse.com.au  Fact Sheet for Healthcare Providers:  IncredibleEmployment.be  This test is no t yet approved or cleared by the [place] FDA and  has been authorized for detection and/or diagnosis of SARS-CoV-2 by FDA under an Emergency Use Authorization (EUA). This EUA will remain  in effect (meaning this test can be used) for the duration of the COVID-19 declaration under Section 564(b)(1) of the Act, 21 U.S.C.section 360bbb-3(b)(1), unless the authorization is terminated  or revoked sooner.       Influenza A by PCR NEGATIVE NEGATIVE Final   Influenza B by PCR NEGATIVE NEGATIVE Final    Comment: (NOTE) The Xpert Xpress SARS-CoV-2/FLU/RSV plus assay is intended as an aid in the diagnosis of influenza from Nasopharyngeal swab specimens and should not be used as a sole basis for treatment. Nasal washings and aspirates are unacceptable for Xpert Xpress SARS-CoV-2/FLU/RSV testing.  Fact Sheet for Patients: EntrepreneurPulse.com.au  Fact Sheet for Healthcare Providers: IncredibleEmployment.be  This test is not yet approved or cleared by the [place] FDA and has been authorized for detection and/or diagnosis of SARS-CoV-2 by FDA under an Emergency Use Authorization (EUA). This EUA will remain in effect (meaning this test can be used) for the duration of  the COVID-19 declaration under Section 564(b)(1) of the Act, 21 U.S.C. section 360bbb-3(b)(1), unless the authorization is terminated or revoked.  Performed at [facility], [address] [place], [place] [address]   MRSA PCR Screening     Status: None   Collection Time: 05/14/21 10:58 PM   Specimen: Nasal Mucosa; Nasopharyngeal  Result Value Ref Range Status   MRSA by PCR NEGATIVE NEGATIVE Final    Comment:  The GeneXpert MRSA Assay (FDA approved for NASAL specimens only), is one component of a comprehensive MRSA colonization surveillance program. It is not intended to diagnose MRSA infection nor to guide or monitor treatment for MRSA infections. Performed at [facility], [address], [place], [place] [address]   Culture, blood (Routine X 2) w Reflex to ID Panel     Status: None (Preliminary result)   Collection Time: 05/15/21 11:26 PM   Specimen: BLOOD RIGHT FOREARM  Result Value Ref Range Status   Specimen Description BLOOD RIGHT FOREARM  Final   Special Requests   Final    BOTTLES DRAWN AEROBIC AND ANAEROBIC Blood Culture adequate volume   Culture   Final    NO GROWTH < 12 HOURS Performed at [facility], [address], [place], [place] [address]    Report Status PENDING  Incomplete  Culture, blood (Routine X 2) w Reflex to ID Panel     Status: None (Preliminary result)   Collection Time: 05/15/21 11:29 PM   Specimen: BLOOD  Result Value Ref Range Status   Specimen Description BLOOD RIGHT ANTECUBITAL  Final   Special Requests   Final    BOTTLES DRAWN AEROBIC AND ANAEROBIC Blood Culture adequate volume   Culture   Final    NO GROWTH < 12 HOURS Performed at [facility], [address] [place], [place] [address]    Report Status PENDING  Incomplete    Radiology Reports DG Chest 1 View  Result Date: 05/14/2021 CLINICAL DATA:  Fall, hip pain EXAM: CHEST  1 VIEW COMPARISON:  11/12/2018 FINDINGS: Heart is borderline in size. Peribronchial thickening,  vascular congestion and interstitial prominence. This could reflect interstitial edema. No confluent opacities or effusions. IMPRESSION: Bronchitic changes. Suspect mild vascular congestion and interstitial edema. Electronically Signed   By: Rolm Baptise M.D.   On: 05/14/2021 18:19   CT HEAD WO CONTRAST  Result Date: 05/14/2021 CLINICAL DATA:  87 year old female with encephalopathy. EXAM: CT HEAD WITHOUT CONTRAST TECHNIQUE: Contiguous axial images were obtained from the base of the skull through the vertex without intravenous contrast. COMPARISON:  Head CT dated 05/14/2021. FINDINGS: Brain: Moderate age-related atrophy and chronic microvascular ischemic changes. There is no acute intracranial hemorrhage. No mass effect or midline shift. No extra-axial fluid collection. Vascular: No hyperdense vessel or unexpected calcification. Skull: Normal. Negative for fracture or focal lesion. Sinuses/Orbits: Right maxillary sinus retention cyst or polyp. The remainder of the visualized paranasal sinuses and mastoid air cells are clear. Other: None IMPRESSION: 1. No acute intracranial pathology. 2. Moderate age-related atrophy and chronic microvascular ischemic changes. Electronically Signed   By: Anner Crete M.D.   On: 05/14/2021 23:01   CT Head Wo Contrast  Result Date: 05/14/2021 CLINICAL DATA:  Status post fall out of bed.  Facial laceration. EXAM: CT HEAD WITHOUT CONTRAST CT CERVICAL SPINE WITHOUT CONTRAST TECHNIQUE: Multidetector CT imaging of the head and cervical spine was performed following the standard protocol without intravenous contrast. Multiplanar CT image reconstructions of the cervical spine were also generated. COMPARISON:  Head CT 09/12/2017.  CT neck 10/10/2016. FINDINGS: CT HEAD FINDINGS Brain: No evidence of acute infarction, hemorrhage, hydrocephalus, extra-axial collection or mass lesion/mass effect. Vascular: No hyperdense vessel or unexpected calcification. Skull: Intact.  No focal lesion.  Sinuses/Orbits: Wall and mucosal thickening in the right maxillary sinus consistent with chronic sinus disease appear unchanged. Mild ethmoid air cell disease on left noted. Other: None. CT CERVICAL SPINE FINDINGS Alignment: Normal. Skull base and vertebrae: No  acute fracture. No primary bone lesion or focal pathologic process. Soft tissues and spinal canal: No prevertebral fluid or swelling. No visible canal hematoma. Disc levels: Mild multilevel loss of disc space height and facet arthropathy noted. Upper chest: There is interlobular septal thickening and ground-glass attenuation in the lung apices. Other: None. IMPRESSION: No acute abnormality head or cervical spine. Appearance of the lung apices suggestive of interstitial edema. Electronically Signed   By: Inge Rise M.D.   On: 05/14/2021 17:54   CT Cervical Spine Wo Contrast  Result Date: 05/14/2021 CLINICAL DATA:  Status post fall out of bed.  Facial laceration. EXAM: CT HEAD WITHOUT CONTRAST CT CERVICAL SPINE WITHOUT CONTRAST TECHNIQUE: Multidetector CT imaging of the head and cervical spine was performed following the standard protocol without intravenous contrast. Multiplanar CT image reconstructions of the cervical spine were also generated. COMPARISON:  Head CT 09/12/2017.  CT neck 10/10/2016. FINDINGS: CT HEAD FINDINGS Brain: No evidence of acute infarction, hemorrhage, hydrocephalus, extra-axial collection or mass lesion/mass effect. Vascular: No hyperdense vessel or unexpected calcification. Skull: Intact.  No focal lesion. Sinuses/Orbits: Wall and mucosal thickening in the right maxillary sinus consistent with chronic sinus disease appear unchanged. Mild ethmoid air cell disease on left noted. Other: None. CT CERVICAL SPINE FINDINGS Alignment: Normal. Skull base and vertebrae: No acute fracture. No primary bone lesion or focal pathologic process. Soft tissues and spinal canal: No prevertebral fluid or swelling. No visible canal hematoma.  Disc levels: Mild multilevel loss of disc space height and facet arthropathy noted. Upper chest: There is interlobular septal thickening and ground-glass attenuation in the lung apices. Other: None. IMPRESSION: No acute abnormality head or cervical spine. Appearance of the lung apices suggestive of interstitial edema. Electronically Signed   By: Inge Rise M.D.   On: 05/14/2021 17:54   DG CHEST PORT 1 VIEW  Result Date: 05/16/2021 CLINICAL DATA:  87 year old female with shortness of breath. EXAM: PORTABLE CHEST 1 VIEW COMPARISON:  Portable chest 05/14/2021 and earlier. FINDINGS: Portable AP semi upright view at 0521 hours. Larger lung volumes and decreased bilateral pulmonary interstitial opacity. Lung markings appear to be near baseline now. Stable cardiac size and mediastinal contours. No definite cardiomegaly. Visualized tracheal air column is within normal limits. No pneumothorax or pleural effusion. Sequelae of thoracolumbar junction region vertebral plasty. Stable visualized osseous structures. Paucity of bowel gas in the upper abdomen. IMPRESSION: Improved lung volumes and ventilation since 05/14/2020 with resolving edema or less likely viral/atypical infection. Electronically Signed   By: Genevie Ann M.D.   On: 05/16/2021 06:34   DG CHEST PORT 1 VIEW  Result Date: 05/14/2021 CLINICAL DATA:  Unresponsive EXAM: PORTABLE CHEST 1 VIEW COMPARISON:  05/14/2021 FINDINGS: Cardiac shadow is stable. Aortic calcifications are again seen. Lungs are well aerated bilaterally. Increasing interstitial edema is noted. No confluent infiltrate or effusion is noted. Changes of prior vertebral augmentation are again seen. IMPRESSION: Increasing pulmonary edema. Electronically Signed   By: Inez Catalina M.D.   On: 05/14/2021 23:18   ECHOCARDIOGRAM COMPLETE  Result Date: 05/15/2021    ECHOCARDIOGRAM REPORT   Patient Name:   SHAWNTEL FARNWORTH Date of Exam: 05/15/2021 Medical Rec #:  [id number]    Height:       63.0 in Accession  #:    [id number]   Weight:       99.6 lb Date of Birth:  30-Oct-1923     BSA:          1.438 m Patient Age:  89 years     BP:           150/71 mmHg Patient Gender: F            HR:           86 bpm. Exam Location:  [facility] Procedure: 2D Echo, Cardiac Doppler and Color Doppler Indications:    CHF  History:        Patient has prior history of Echocardiogram examinations, most                 recent 09/13/2017. Mitral regurgitation, Arrythmias:Atrial                 Fibrillation, Signs/Symptoms:Altered Mental Status, Dyspnea and                 Pulmonary edema; Risk Factors:Hypertension and Dyslipidemia.                 Right hip fracture.  Sonographer:    Dustin Flock RDCS Referring Phys: [phone] [facility]  Sonographer Comments: No apical window and no subcostal window. Right hip fracture and very thin body habitus. IMPRESSIONS  1. Left ventricular ejection fraction, by estimation, is 60 to 65%. The left ventricle has normal function. Left ventricular endocardial border not optimally defined to evaluate regional wall motion. Left ventricular diastolic parameters are indeterminate.  2. Right ventricular systolic function was not well visualized. The right ventricular size is not well visualized. There is normal pulmonary artery systolic pressure.  3. The mitral valve was not well visualized. Mild mitral valve regurgitation. No evidence of mitral stenosis.  4. The aortic valve is tricuspid. Aortic valve regurgitation is not visualized. No aortic stenosis is present.  5. Limited views obtaine due to limitations on patient positioning. FINDINGS  Left Ventricle: Left ventricular ejection fraction, by estimation, is 60 to 65%. The left ventricle has normal function. Left ventricular endocardial border not optimally defined to evaluate regional wall motion. The left ventricular internal cavity size was normal in size. There is no left ventricular hypertrophy. Left ventricular diastolic parameters are  indeterminate. Right Ventricle: The right ventricular size is not well visualized. Right vetricular wall thickness was not well visualized. Right ventricular systolic function was not well visualized. There is normal pulmonary artery systolic pressure. The tricuspid regurgitant velocity is 1.82 m/s, and with an assumed right atrial pressure of 3 mmHg, the estimated right ventricular systolic pressure is 93.8 mmHg. Left Atrium: Left atrial size was not well visualized. Right Atrium: Right atrial size was not well visualized. Pericardium: There is no evidence of pericardial effusion. Mitral Valve: The mitral valve was not well visualized. Mild mitral valve regurgitation. No evidence of mitral valve stenosis. Tricuspid Valve: The tricuspid valve is not well visualized. Tricuspid valve regurgitation is mild . No evidence of tricuspid stenosis. Aortic Valve: The aortic valve is tricuspid. Aortic valve regurgitation is not visualized. No aortic stenosis is present. Pulmonic Valve: The pulmonic valve was not well visualized. Pulmonic valve regurgitation is not visualized. No evidence of pulmonic stenosis. Aorta: The aortic root is normal in size and structure. Pulmonary Artery: Indeterminant PASP, inadequate TR jet. IAS/Shunts: The interatrial septum was not well visualized.  LEFT VENTRICLE PLAX 2D LVIDd:         3.23 cm LVIDs:         2.14 cm LV PW:         0.89 cm LV IVS:        0.89 cm LVOT diam:  1.90 cm LVOT Area:     2.84 cm  LEFT ATRIUM         Index LA diam:    3.80 cm 2.64 cm/m   AORTA Ao Root diam: 2.30 cm TRICUSPID VALVE TR Peak grad:   13.2 mmHg TR Vmax:        182.00 cm/s  SHUNTS Systemic Diam: 1.90 cm Carlyle Dolly MD Electronically signed by Carlyle Dolly MD Signature Date/Time: 05/15/2021/4:09:43 PM    Final    DG Hip Unilat With Pelvis 2-3 Views Right  Result Date: 05/14/2021 CLINICAL DATA:  Fall, right hip pain EXAM: DG HIP (WITH OR WITHOUT PELVIS) 2-3V RIGHT COMPARISON:  07/18/2012 FINDINGS:  There is a right femoral neck fracture noted. No subluxation or dislocation. Slight impaction. Early degenerative changes in the hips bilaterally. IMPRESSION: Right femoral neck fracture. Electronically Signed   By: Rolm Baptise M.D.   On: 05/14/2021 18:16     CBC Recent Labs  Lab 05/14/21 1721 05/14/21 2259 05/15/21 0440 05/16/21 0551  WBC 9.7 18.8* 18.7* 12.1*  HGB 11.5* 12.2 12.3 11.4*  HCT 35.3* 37.9 37.3 34.9*  PLT 865* 851* 847* 744*  MCV 102.3* 101.3* 99.7 100.3*  MCH 33.3 32.6 32.9 32.8  MCHC 32.6 32.2 33.0 32.7  RDW 17.1* 16.9* 16.7* 16.9*  LYMPHSABS 1.6 2.0  --   --   MONOABS 0.8 1.2*  --   --   EOSABS 0.4 0.2  --   --   BASOSABS 0.0 0.1  --   --     Chemistries  Recent Labs  Lab 05/14/21 1721 05/14/21 2259 05/15/21 0440 05/16/21 0551  NA 136 136 137 140  K 4.9 4.3 4.5 4.0  CL 101 102 101 102  CO2 28 23 25 26   GLUCOSE 92 164* 155* 117*  BUN 43* 40* 39* 47*  CREATININE 2.07* 1.78* 1.69* 1.63*  CALCIUM 8.9 8.6* 9.0 9.0   ------------------------------------------------------------------------------------------------------------------ No results for input(s): CHOL, HDL, LDLCALC, TRIG, CHOLHDL, LDLDIRECT in the last 72 hours.  Lab Results  Component Value Date   HGBA1C 5.7 (H) 09/13/2017   ------------------------------------------------------------------------------------------------------------------ Recent Labs    05/15/21 2326  TSH 0.935   ------------------------------------------------------------------------------------------------------------------ No results for input(s): VITAMINB12, FOLATE, FERRITIN, TIBC, IRON, RETICCTPCT in the last 72 hours.  Coagulation profile Recent Labs  Lab 05/14/21 1721 05/15/21 0440 05/16/21 0551  INR 2.7* 2.3* 1.6*    No results for input(s): DDIMER in the last 72 hours.  Cardiac Enzymes No results for input(s): CKMB, TROPONINI, MYOGLOBIN in the last 168 hours.  Invalid input(s):  CK ------------------------------------------------------------------------------------------------------------------    Component Value Date/Time   BNP 238.0 (H) 09/12/2017 9629     Roxan Hockey M.D on 05/16/2021 at 6:39 PM  Go to [email or web address] - for contact info  [facility] - Office  [phone]

## 2021-05-16 NOTE — TOC Initial Note (Addendum)
Transition of Care [facility]) - Initial/Assessment Note    Patient Details  Name: Leslie Rosario MRN: [id number] Date of Birth: [birth year]  Transition of Care [facility]) CM/SW Contact:    Boneta Lucks, RN Phone Number: 05/16/2021, 1:55 PM  Clinical Narrative:   Patient admitted with right hip fracture, waiting to be stable for surgery, Planning surgery for tomorrow. Patient is from [facility]. MD and Palliative has discussed with family SNF and outpatient palliative. TOC spoke with Octavia Bruckner - son, [facility] is the first choice, Patient is fully vaccinated for COVID.  Referral sent out for SNF to review. PT eval pending. TOC to follow.                Addendum: PNC offer, [name] her son accepted the semi private room, TOC reviewed [place].  Expected Discharge Plan: Skilled Nursing Facility Barriers to Discharge: Continued Medical Work up   Patient Goals and CMS Choice Patient states their goals for this hospitalization and ongoing recovery are:: to go to rehab CMS Medicare.gov Compare Post Acute Care list provided to:: Patient Represenative (must comment) Choice offered to / list presented to : Adult Children  Expected Discharge Plan and Services Expected Discharge Plan: [place]              Prior Living Arrangements/Services     Patient language and need for interpreter reviewed:: Yes        Need for Family Participation in Patient Care: Yes (Comment) Care giver support system in place?: Yes (comment)   Criminal Activity/Legal Involvement Pertinent to Current Situation/Hospitalization: No - Comment as needed  Activities of Daily Living Home Assistive Devices/Equipment: Walker (specify type),Wheelchair ADL Screening (condition at time of admission) Patient's cognitive ability adequate to safely complete daily activities?: No Is the patient deaf or have difficulty hearing?: Yes Does the patient have difficulty seeing, even when wearing glasses/contacts?:  Yes Does the patient have difficulty concentrating, remembering, or making decisions?: Yes Patient able to express need for assistance with ADLs?: No Does the patient have difficulty dressing or bathing?: Yes Independently performs ADLs?: No Communication: Dependent Is this a change from baseline?: Change from baseline, expected to last >3 days Dressing (OT): Dependent Is this a change from baseline?: Change from baseline, expected to last >3 days Grooming: Dependent Is this a change from baseline?: Change from baseline, expected to last >3 days Feeding: Dependent Is this a change from baseline?: Change from baseline, expected to last >3 days Bathing: Dependent Is this a change from baseline?: Change from baseline, expected to last >3 days Toileting: Dependent Is this a change from baseline?: Change from baseline, expected to last >3days In/Out Bed: Dependent Is this a change from baseline?: Change from baseline, expected to last >3 days Walks in Home: Needs assistance Is this a change from baseline?: Pre-admission baseline Does the patient have difficulty walking or climbing stairs?: Yes Weakness of Legs: Both Weakness of Arms/Hands: Both  Permission Sought/Granted        Permission granted to share info w Relationship: Son   Emotional Assessment     Affect (typically observed): Accepting Orientation: : Oriented to Self,Oriented to Place Alcohol / Substance Use: Not Applicable Psych Involvement: No (comment)  Admission diagnosis:  Closed right hip fracture ([place]) [S72.001A] Patient Active Problem List   Diagnosis Date Noted  . Acute metabolic encephalopathy 93/26/7124  . Closed right hip fracture ([place]) 05/14/2021  . Essential thrombocythemia ([place]) 12/19/2020  . Thrombocytosis 11/01/2020  . Leukocytosis 11/01/2020  .  Community acquired pneumonia 11/12/2018  . Weakness 11/12/2018  . Pneumonia 11/12/2018  . S/P kyphoplasty 06/30/2018  . Acute encephalopathy 09/13/2017   . T12 compression fracture ([place]) 09/13/2017  . AMS (altered mental status)   . Chronic prescription benzodiazepine use   . Opiate use   . Slurred speech 09/12/2017  . Essential hypertension 07/23/2016  . Hypothyroidism 03/30/2015  . Mild pulmonary hypertension ([place]) 01/16/2014  . Moderate mitral regurgitation by prior echocardiogram 01/16/2014  . Long term current use of anticoagulant therapy 02/20/2013  . Dehydration 07/18/2012  . UTI (urinary tract infection) 07/18/2012  . Constipation 02/17/2012  . Acute gastritis 12/29/2011  . GERD (gastroesophageal reflux disease)   . Atrial fibrillation ([place]) 09/12/2010  . POLYDIPSIA 09/12/2010  . Dysphagia 08/22/2010  . ABDOMINAL PAIN, LEFT LOWER QUADRANT 03/05/2010  . HYPERCHOLESTEROLEMIA 03/04/2010  . HYPERLIPIDEMIA 03/04/2010  . Anxiety state 03/04/2010  . GERD 03/04/2010  . HIATAL HERNIA, HX OF 03/04/2010   PCP:  Celene Squibb, MD Pharmacy:   [place], [place] [address] Phone: [phone] Fax: [phone]  [place], [place] [address] [place] [address] Phone: [phone] Fax: [place], [place] [address] Phone: [phone] Fax: [phone]   Readmission Risk Interventions Readmission Risk Prevention Plan 05/16/2021  Transportation Screening Complete  HRI or [place] Complete  Social Work Consult for [place] Planning/Counseling Complete  Palliative Care Screening Not Applicable  Medication Review Press photographer) Complete  Some recent data might be hidden

## 2021-05-16 NOTE — NC FL2 (Signed)
[place] MEDICAID FL2 LEVEL OF CARE SCREENING TOOL     IDENTIFICATION  Patient Name: Leslie Rosario Birthdate: 1923-11-23 Sex: female Admission Date (Current Location): 05/14/2021  [facility] and [place] Number:  [employer] and Address:  [place] [address] [place]      Provider Number: [id number]  Attending Physician Name and Address:  Roxan Hockey, MD  Relative Name and Phone Number:  Stephine Langbehn - Son - [phone]    Current Level of Care: Hospital Recommended Level of Care: [place] Prior Approval Number:    Date Approved/Denied:   PASRR Number: [id number]  Discharge Plan: SNF    Current Diagnoses: Patient Active Problem List   Diagnosis Date Noted  . Acute metabolic encephalopathy 43/15/4008  . Closed right hip fracture ([place]) 05/14/2021  . Essential thrombocythemia ([place]) 12/19/2020  . Thrombocytosis 11/01/2020  . Leukocytosis 11/01/2020  . Community acquired pneumonia 11/12/2018  . Weakness 11/12/2018  . Pneumonia 11/12/2018  . S/P kyphoplasty 06/30/2018  . Acute encephalopathy 09/13/2017  . T12 compression fracture ([place]) 09/13/2017  . AMS (altered mental status)   . Chronic prescription benzodiazepine use   . Opiate use   . Slurred speech 09/12/2017  . Essential hypertension 07/23/2016  . Hypothyroidism 03/30/2015  . Mild pulmonary hypertension ([place]) 01/16/2014  . Moderate mitral regurgitation by prior echocardiogram 01/16/2014  . Long term current use of anticoagulant therapy 02/20/2013  . Dehydration 07/18/2012  . UTI (urinary tract infection) 07/18/2012  . Constipation 02/17/2012  . Acute gastritis 12/29/2011  . GERD (gastroesophageal reflux disease)   . Atrial fibrillation ([place]) 09/12/2010  . POLYDIPSIA 09/12/2010  . Dysphagia 08/22/2010  . ABDOMINAL PAIN, LEFT LOWER QUADRANT 03/05/2010  . HYPERCHOLESTEROLEMIA 03/04/2010  . HYPERLIPIDEMIA 03/04/2010  . Anxiety state 03/04/2010  .  GERD 03/04/2010  . HIATAL HERNIA, HX OF 03/04/2010    Orientation RESPIRATION BLADDER Height & Weight     Self,Place  O2 (1L) External catheter Weight:   Height:     BEHAVIORAL SYMPTOMS/MOOD NEUROLOGICAL BOWEL NUTRITION STATUS      Continent Diet (See Dc Summary)  AMBULATORY STATUS COMMUNICATION OF NEEDS Skin   Extensive Assist Verbally Surgical wounds (right hip)                       Personal Care Assistance Level of Assistance  Bathing,Feeding,Dressing Bathing Assistance: Maximum assistance Feeding assistance: Limited assistance Dressing Assistance: Maximum assistance     Functional Limitations Info  Sight,Hearing,Speech Sight Info: Impaired Hearing Info: Impaired Speech Info: Adequate    SPECIAL CARE FACTORS FREQUENCY  PT (By licensed PT)     PT Frequency: 5 times a week              Contractures Contractures Info: Not present    Additional Factors Info  Allergies,Code Status Code Status Info: DNR Allergies Info: adhesive tape, codeine, prednisone, sulfonamide, vicodin           Current Medications (05/16/2021):  This is the current hospital active medication list Current Facility-Administered Medications  Medication Dose Route Frequency Provider Last Rate Last Admin  . acetaminophen (TYLENOL) tablet 650 mg  650 mg Oral Q6H PRN Emokpae, Ejiroghene E, MD       Or  . acetaminophen (TYLENOL) suppository 650 mg  650 mg Rectal Q6H PRN Emokpae, Ejiroghene E, MD      . ALPRAZolam Duanne Moron) tablet 0.25 mg  0.25 mg Oral BID PRN Roxan Hockey, MD      . [  START ON 05/17/2021] ceFAZolin (ANCEF) IVPB 2g/100 mL premix  2 g Intravenous On Call to OR Carole Civil, MD      . chlorhexidine (HIBICLENS) 4 % liquid 4 application  60 mL Topical Once Carole Civil, MD      . chlorhexidine (HIBICLENS) 4 % liquid 4 application  60 mL Topical Once Carole Civil, MD      . Chlorhexidine Gluconate Cloth 2 % PADS 6 each  6 each Topical Daily Roxan Hockey,  MD   6 each at 05/16/21 [phone]  . DULoxetine (CYMBALTA) DR capsule 30 mg  30 mg Oral Daily Emokpae, Courage, MD   30 mg at 05/16/21 0815  . furosemide (LASIX) injection 20 mg  20 mg Intravenous Daily Emokpae, Courage, MD   20 mg at 05/16/21 0819  . hydroxyurea (HYDREA) capsule 500 mg  500 mg Oral Q T,Th,S,Su Emokpae, Courage, MD   500 mg at 05/16/21 0818  . levothyroxine (SYNTHROID) tablet 88 mcg  88 mcg Oral Q0600 Emokpae, Courage, MD      . metoprolol tartrate (LOPRESSOR) tablet 25 mg  25 mg Oral BID Denton Brick, Courage, MD   25 mg at 05/16/21 0816  . morphine 2 MG/ML injection 2 mg  2 mg Intravenous Q4H PRN Emokpae, Ejiroghene E, MD   2 mg at 05/15/21 1729  . ondansetron (ZOFRAN) tablet 4 mg  4 mg Oral Q6H PRN Emokpae, Ejiroghene E, MD       Or  . ondansetron (ZOFRAN) injection 4 mg  4 mg Intravenous Q6H PRN Emokpae, Ejiroghene E, MD      . oxyCODONE (Oxy IR/ROXICODONE) immediate release tablet 5 mg  5 mg Oral Q6H PRN Emokpae, Courage, MD      . pantoprazole (PROTONIX) EC tablet 40 mg  40 mg Oral Daily Emokpae, Courage, MD   40 mg at 05/16/21 0815  . polyethylene glycol (MIRALAX / GLYCOLAX) packet 17 g  17 g Oral Daily PRN Emokpae, Ejiroghene E, MD      . povidone-iodine 10 % swab 2 application  2 application Topical Once Carole Civil, MD      . povidone-iodine 10 % swab 2 application  2 application Topical Once Carole Civil, MD         Discharge Medications: Please see discharge summary for a list of discharge medications.  Relevant Imaging Results:  Relevant Lab Results:   Additional Information SS# [id number]  Boneta Lucks, RN

## 2021-05-16 NOTE — Progress Notes (Signed)
Informed consent obtained. Verbal consent from son Heiress [place] . Witnessed by Karsten Fells , LPN and placed in patients medical chart,

## 2021-05-16 NOTE — Consult Note (Signed)
[place]   Patient ID: Leslie Rosario, female   DOB: [birth year], 87 y.o.   MRN: [id number]  New patient  Requested by: [facility]  Reason for: Right hip fracture  Based on the information below I recommend open reduction internal fixation right hip with cannulated screws*  Chief Complaint  Patient presents with  . Fall     HPI  87 year old female fell at her nursing home according to the ER doctor she can walk with a walker  She has atrial fibrillation  She came in to the hospital and then the night of admission developed rapid ventricular response atrial fibrillation change in mental status and was sent to the ICU  She is now on the floor awaiting normalization of pro time as she is on Coumadin.   Patient is not able to give a history due to age and mentation Location pain is in the right hip Duration date of injury was May 31 Severity cannot determine Quality cannot determine Modified by cannot determine  Review of Systems (all) Review of Systems  Unable to perform ROS: Mental status change    Past Medical History:  Diagnosis Date  . Anxiety   . Arthritis   . Atrial fibrillation ([place])   . Constipation   . Depression   . Dyspnea   . Dysrhythmia    A-Fib  . GERD (gastroesophageal reflux disease)   . Hyperlipidemia   . Hypertension   . Hypothyroidism   . Nausea & vomiting   . T12 compression fracture ([place]) 06/2017  . Urinary frequency     Past Surgical History:  Procedure Laterality Date  . COLONOSCOPY    . KYPHOPLASTY N/A 06/30/2018   Procedure: KYPHOPLASTY L1 and T12;  Surgeon: Melina Schools, MD;  Location: [facility];  Service: Orthopedics;  Laterality: N/A;  90 mins  . NM MYOCAR PERF WALL MOTION  04/2006   dipyridamole; normal pattern of perfusion to all regions, post-stress EF 73%  . TONSILLECTOMY    . TRANSTHORACIC ECHOCARDIOGRAM  04/2012   EF=>55%, borderline conc LVH; LA mod dilated; RA mildly dilated; mild mitral  annular calcif, mod MR; mild-mod TR with elevated RSVP 30-37mmHg, mild pulm HTN; mild calcif of AV leaflets, AV mildly sclerotic; aortic root sclerosis/calcif  . UPPER GASTROINTESTINAL ENDOSCOPY      Family History  Problem Relation Age of Onset  . Cancer Mother   . Ovarian cancer Mother   . Cancer Father   . Cancer Brother   . Emphysema Brother   . Emphysema Brother   . Pneumonia Brother   . Aneurysm Brother   . Healthy Son   . Sleep apnea Son   . Aortic aneurysm Other   . Kidney cancer Son    Social History   Tobacco Use  . Smoking status: Former Smoker    Quit date: 01/10/1985    Years since quitting: 36.3  . Smokeless tobacco: Never Used  . Tobacco comment: never inhaled  Vaping Use  . Vaping Use: Never used  Substance Use Topics  . Alcohol use: No    Alcohol/week: 0.0 standard drinks  . Drug use: No   Allergies  Allergen Reactions  . Adhesive [Tape] Other (See Comments)    REACTION: pulls skin  . Codeine     UNKNOWN REACTION  . Prednisone     UNKNOWN  . Sulfonamide Derivatives     UNKNOWN REACTION  . Vicodin [Hydrocodone-Acetaminophen]     "Intolerance "  Current Facility-Administered Medications:  .  acetaminophen (TYLENOL) tablet 650 mg, 650 mg, Oral, Q6H PRN **OR** acetaminophen (TYLENOL) suppository 650 mg, 650 mg, Rectal, Q6H PRN, Emokpae, Ejiroghene E, MD .  ALPRAZolam (XANAX) tablet 0.25 mg, 0.25 mg, Oral, BID PRN, Emokpae, Courage, MD .  chlorhexidine (HIBICLENS) 4 % liquid 4 application, 60 mL, Topical, Once, Carole Civil, MD .  Chlorhexidine Gluconate Cloth 2 % PADS 6 each, 6 each, Topical, Daily, Denton Brick, Courage, MD, 6 each at 05/15/21 0913 .  DULoxetine (CYMBALTA) DR capsule 30 mg, 30 mg, Oral, Daily, Emokpae, Courage, MD, 30 mg at 05/15/21 1644 .  furosemide (LASIX) injection 20 mg, 20 mg, Intravenous, Daily, Emokpae, Courage, MD, 20 mg at 05/15/21 1645 .  hydroxyurea (HYDREA) capsule 500 mg, 500 mg, Oral, Q T,Th,S,Su, Emokpae, Courage,  MD .  levothyroxine (SYNTHROID) tablet 88 mcg, 88 mcg, Oral, Q0600, Emokpae, Courage, MD .  metoprolol tartrate (LOPRESSOR) tablet 25 mg, 25 mg, Oral, BID, Emokpae, Courage, MD, 25 mg at 05/15/21 1644 .  morphine 2 MG/ML injection 2 mg, 2 mg, Intravenous, Q4H PRN, Emokpae, Ejiroghene E, MD, 2 mg at 05/15/21 1729 .  ondansetron (ZOFRAN) tablet 4 mg, 4 mg, Oral, Q6H PRN **OR** ondansetron (ZOFRAN) injection 4 mg, 4 mg, Intravenous, Q6H PRN, Emokpae, Ejiroghene E, MD .  oxyCODONE (Oxy IR/ROXICODONE) immediate release tablet 5 mg, 5 mg, Oral, Q6H PRN, Emokpae, Courage, MD .  pantoprazole (PROTONIX) EC tablet 40 mg, 40 mg, Oral, Daily, Emokpae, Courage, MD, 40 mg at 05/15/21 1644 .  polyethylene glycol (MIRALAX / GLYCOLAX) packet 17 g, 17 g, Oral, Daily PRN, Emokpae, Ejiroghene E, MD .  povidone-iodine 10 % swab 2 application, 2 application, Topical, Once, Carole Civil, MD    Physical Exam(=30) BP 117/66 (BP Location: Right Arm)   Pulse 84   Temp 97.9 F (36.6 C)   Resp 14   SpO2 91%   Gen. Appearance thin small frail elderly female Peripheral vascular system normal pulses perfusion without edema Lymph nodes ARE NORMAL  Gait unable to ambulate secondary to fracture  Left Upper extremity  Inspection revealed no malalignment or asymmetry  Assessment of range of motion: Full range of motion was recorded  Assessment of stability: Elbow wrist and hand and shoulder were stable  Assessment of muscle strength and tone revealed grade 5 muscle strength and normal muscle tone  Skin was normal without rash lesion or ulceration  Right upper extremity  Inspection revealed no malalignment or asymmetry  Assessment of range of motion: Full range of motion was recorded  Assessment of stability: Elbow wrist and hand and shoulder were stable  Assessment of muscle strength and tone revealed grade 5 muscle strength and normal muscle tone  Skin was normal without rash lesion or ulceration  Right  Lower extremity  Inspection revealed mild external rotation deformity  Mild shortening  Range of motion was deferred because of pain in the right leg  The ankle and knee are stable  Muscle tone is normal  There is no atrophy No major contractures skin normal  Left lower extremity Inspection revealed no malalignment or asymmetry Assessment of range of motion: Full range of motion was recorded Assessment of stability: Ankle, knee and hip were stable Assessment of muscle strength and tone revealed grade 5 muscle strength and normal muscle tone Skin was normal without rash lesion or ulceration  Coordination could not be tested due to mentation  Upper extremity reflexes 1-2+ at the elbow  Lower extremities deferred  Sensation responded with painful grimace to palpation of the right hip  Deep tendon reflexes were 2+ in the upper extremities and 2+ in the lower extremities   Mental status  Oriented to time person and place normal  Mood and affect normal without depression anxiety or agitation  Dx:   Data Reviewed  ER RECORD REVIEWED: CONFIRMS HISTORY   I reviewed the following images and the reports and my independent interpretation is valgus impacted right femoral neck fracture   Assessment  Right femoral neck fracture, valgus impacted  Plan   ORIF right hip with cannulated screws once patient clears medically including normalization of pro time and INR  CBC Latest Ref Rng & Units 05/16/2021 05/15/2021 05/14/2021  WBC 4.0 - 10.5 K/uL 12.1(H) 18.7(H) 18.8(H)  Hemoglobin 12.0 - 15.0 g/dL 11.4(L) 12.3 12.2  Hematocrit 36.0 - 46.0 % 34.9(L) 37.3 37.9  Platelets 150 - 400 K/uL 744(H) 847(H) 851(H)   BMP Latest Ref Rng & Units 05/16/2021 05/15/2021 05/14/2021  Glucose 70 - 99 mg/dL 117(H) 155(H) 164(H)  BUN 8 - 23 mg/dL 47(H) 39(H) 40(H)  Creatinine 0.44 - 1.00 mg/dL 1.63(H) 1.69(H) 1.78(H)  BUN/Creat Ratio 12 - 28 - - -  Sodium 135 - 145 mmol/L 140 137 136  Potassium 3.5  - 5.1 mmol/L 4.0 4.5 4.3  Chloride 98 - 111 mmol/L 102 101 102  CO2 22 - 32 mmol/L 26 25 23   Calcium 8.9 - 10.3 mg/dL 9.0 9.0 8.6(L)      Carole Civil MD

## 2021-05-16 NOTE — H&P (View-Only) (Signed)
[place]   Patient ID: Leslie Rosario, female   DOB: 1923/10/10, 87 y.o.   MRN: [id number]  New patient  Requested by: [facility]  Reason for: Right hip fracture  Based on the information below I recommend open reduction internal fixation right hip with cannulated screws*  Chief Complaint  Patient presents with  . Fall     HPI  87 year old female fell at her nursing home according to the ER doctor she can walk with a walker  She has atrial fibrillation  She came in to the hospital and then the night of admission developed rapid ventricular response atrial fibrillation change in mental status and was sent to the ICU  She is now on the floor awaiting normalization of pro time as she is on Coumadin.   Patient is not able to give a history due to age and mentation Location pain is in the right hip Duration date of injury was May 31 Severity cannot determine Quality cannot determine Modified by cannot determine  Review of Systems (all) Review of Systems  Unable to perform ROS: Mental status change    Past Medical History:  Diagnosis Date  . Anxiety   . Arthritis   . Atrial fibrillation ([place])   . Constipation   . Depression   . Dyspnea   . Dysrhythmia    A-Fib  . GERD (gastroesophageal reflux disease)   . Hyperlipidemia   . Hypertension   . Hypothyroidism   . Nausea & vomiting   . T12 compression fracture ([place]) 06/2017  . Urinary frequency     Past Surgical History:  Procedure Laterality Date  . COLONOSCOPY    . KYPHOPLASTY N/A 06/30/2018   Procedure: KYPHOPLASTY L1 and T12;  Surgeon: Melina Schools, MD;  Location: [facility];  Service: Orthopedics;  Laterality: N/A;  90 mins  . NM MYOCAR PERF WALL MOTION  04/2006   dipyridamole; normal pattern of perfusion to all regions, post-stress EF 73%  . TONSILLECTOMY    . TRANSTHORACIC ECHOCARDIOGRAM  04/2012   EF=>55%, borderline conc LVH; LA mod dilated; RA mildly dilated; mild mitral  annular calcif, mod MR; mild-mod TR with elevated RSVP 30-39mmHg, mild pulm HTN; mild calcif of AV leaflets, AV mildly sclerotic; aortic root sclerosis/calcif  . UPPER GASTROINTESTINAL ENDOSCOPY      Family History  Problem Relation Age of Onset  . Cancer Mother   . Ovarian cancer Mother   . Cancer Father   . Cancer Brother   . Emphysema Brother   . Emphysema Brother   . Pneumonia Brother   . Aneurysm Brother   . Healthy Son   . Sleep apnea Son   . Aortic aneurysm Other   . Kidney cancer Son    Social History   Tobacco Use  . Smoking status: Former Smoker    Quit date: 01/10/1985    Years since quitting: 36.3  . Smokeless tobacco: Never Used  . Tobacco comment: never inhaled  Vaping Use  . Vaping Use: Never used  Substance Use Topics  . Alcohol use: No    Alcohol/week: 0.0 standard drinks  . Drug use: No   Allergies  Allergen Reactions  . Adhesive [Tape] Other (See Comments)    REACTION: pulls skin  . Codeine     UNKNOWN REACTION  . Prednisone     UNKNOWN  . Sulfonamide Derivatives     UNKNOWN REACTION  . Vicodin [Hydrocodone-Acetaminophen]     "Intolerance "  Current Facility-Administered Medications:  .  acetaminophen (TYLENOL) tablet 650 mg, 650 mg, Oral, Q6H PRN **OR** acetaminophen (TYLENOL) suppository 650 mg, 650 mg, Rectal, Q6H PRN, Emokpae, Ejiroghene E, MD .  ALPRAZolam (XANAX) tablet 0.25 mg, 0.25 mg, Oral, BID PRN, Emokpae, Courage, MD .  chlorhexidine (HIBICLENS) 4 % liquid 4 application, 60 mL, Topical, Once, Carole Civil, MD .  Chlorhexidine Gluconate Cloth 2 % PADS 6 each, 6 each, Topical, Daily, D[place] Brick, Courage, MD, 6 each at 05/15/21 0913 .  DULoxetine (CYMBALTA) DR capsule 30 mg, 30 mg, Oral, Daily, Emokpae, Courage, MD, 30 mg at 05/15/21 1644 .  furosemide (LASIX) injection 20 mg, 20 mg, Intravenous, Daily, Emokpae, Courage, MD, 20 mg at 05/15/21 1645 .  hydroxyurea (HYDREA) capsule 500 mg, 500 mg, Oral, Q T,Th,S,Su, Emokpae, Courage,  MD .  levothyroxine (SYNTHROID) tablet 88 mcg, 88 mcg, Oral, Q0600, Emokpae, Courage, MD .  metoprolol tartrate (LOPRESSOR) tablet 25 mg, 25 mg, Oral, BID, Emokpae, Courage, MD, 25 mg at 05/15/21 1644 .  morphine 2 MG/ML injection 2 mg, 2 mg, Intravenous, Q4H PRN, Emokpae, Ejiroghene E, MD, 2 mg at 05/15/21 1729 .  ondansetron (ZOFRAN) tablet 4 mg, 4 mg, Oral, Q6H PRN **OR** ondansetron (ZOFRAN) injection 4 mg, 4 mg, Intravenous, Q6H PRN, Emokpae, Ejiroghene E, MD .  oxyCODONE (Oxy IR/ROXICODONE) immediate release tablet 5 mg, 5 mg, Oral, Q6H PRN, Emokpae, Courage, MD .  pantoprazole (PROTONIX) EC tablet 40 mg, 40 mg, Oral, Daily, Emokpae, Courage, MD, 40 mg at 05/15/21 1644 .  polyethylene glycol (MIRALAX / GLYCOLAX) packet 17 g, 17 g, Oral, Daily PRN, Emokpae, Ejiroghene E, MD .  povidone-iodine 10 % swab 2 application, 2 application, Topical, Once, Carole Civil, MD    Physical Exam(=30) BP 117/66 (BP Location: Right Arm)   Pulse 84   Temp 97.9 F (36.6 C)   Resp 14   SpO2 91%   Gen. Appearance thin small frail elderly female Peripheral vascular system normal pulses perfusion without edema Lymph nodes ARE NORMAL  Gait unable to ambulate secondary to fracture  Left Upper extremity  Inspection revealed no malalignment or asymmetry  Assessment of range of motion: Full range of motion was recorded  Assessment of stability: Elbow wrist and hand and shoulder were stable  Assessment of muscle strength and tone revealed grade 5 muscle strength and normal muscle tone  Skin was normal without rash lesion or ulceration  Right upper extremity  Inspection revealed no malalignment or asymmetry  Assessment of range of motion: Full range of motion was recorded  Assessment of stability: Elbow wrist and hand and shoulder were stable  Assessment of muscle strength and tone revealed grade 5 muscle strength and normal muscle tone  Skin was normal without rash lesion or ulceration  Right  Lower extremity  Inspection revealed mild external rotation deformity  Mild shortening  Range of motion was deferred because of pain in the right leg  The ankle and knee are stable  Muscle tone is normal  There is no atrophy No major contractures skin normal  Left lower extremity Inspection revealed no malalignment or asymmetry Assessment of range of motion: Full range of motion was recorded Assessment of stability: Ankle, knee and hip were stable Assessment of muscle strength and tone revealed grade 5 muscle strength and normal muscle tone Skin was normal without rash lesion or ulceration  Coordination could not be tested due to mentation  Upper extremity reflexes 1-2+ at the elbow  Lower extremities deferred  Sensation responded with painful grimace to palpation of the right hip  Deep tendon reflexes were 2+ in the upper extremities and 2+ in the lower extremities   Mental status  Oriented to time person and place normal  Mood and affect normal without depression anxiety or agitation  Dx:   Data Reviewed  ER RECORD REVIEWED: CONFIRMS HISTORY   I reviewed the following images and the reports and my independent interpretation is valgus impacted right femoral neck fracture   Assessment  Right femoral neck fracture, valgus impacted  Plan   ORIF right hip with cannulated screws once patient clears medically including normalization of pro time and INR  CBC Latest Ref Rng & Units 05/16/2021 05/15/2021 05/14/2021  WBC 4.0 - 10.5 K/uL 12.1(H) 18.7(H) 18.8(H)  Hemoglobin 12.0 - 15.0 g/dL 11.4(L) 12.3 12.2  Hematocrit 36.0 - 46.0 % 34.9(L) 37.3 37.9  Platelets 150 - 400 K/uL 744(H) 847(H) 851(H)   BMP Latest Ref Rng & Units 05/16/2021 05/15/2021 05/14/2021  Glucose 70 - 99 mg/dL 117(H) 155(H) 164(H)  BUN 8 - 23 mg/dL 47(H) 39(H) 40(H)  Creatinine 0.44 - 1.00 mg/dL 1.63(H) 1.69(H) 1.78(H)  BUN/Creat Ratio 12 - 28 - - -  Sodium 135 - 145 mmol/L 140 137 136  Potassium 3.5  - 5.1 mmol/L 4.0 4.5 4.3  Chloride 98 - 111 mmol/L 102 101 102  CO2 22 - 32 mmol/L 26 25 23   Calcium 8.9 - 10.3 mg/dL 9.0 9.0 8.6(L)      Carole Civil MD

## 2021-05-16 NOTE — Progress Notes (Signed)
Patient is very lethargic and I do not feel safe giving her oral medication. She is slow to respond with verbal answers. However, she does know her name and that she is in the hospital.

## 2021-05-16 NOTE — Progress Notes (Signed)
Palliative: Leslie Rosario has suffered a hip fracture which is scheduled for repair on 6/3.  She appears acutely/chronically ill and somewhat frail.  I am not sure that she can make her basic needs known.  She is on anticoagulation and is awaiting normalization of pro time.  Anticipate need for short-term rehab.  There is no family at bedside at this time.  Call to son, Leslie Rosario, voicemail not set up, unable to leave message.  Call to home number listed, no answer. Call to son, Leslie Rosario.  We talk about surgical repair and expected length of stay, disposition to short-term rehab.  Leslie Rosario shares that Leslie Rosario has lived at [facility] on [address] since Dec 2020.  She had been living independently, but saw need for more assistance.  We talked about STR, Leslie Rosario shares that family preference would be here in [place], [facility] first choice.  We discuss return to ALF vs need for LTC.  Leslie Rosario shares that he feels that Leslie Rosario has been declining over the last few months, and would not be surprised if she were to need long-term care now.  We talked about outpatient palliative services.  Leslie Rosario states that she had outpatient palliative services just prior to moving into [place].  They are agreeable to outpatient palliative services with hospice of [facility].  Had out patient palliative care in past.   We discuss HCPOA, Leslie Rosario shares that both he and Leslie Rosario are durable and healthcare power of attorney's.  CODE STATUS: Leslie Rosario endorses that Leslie Rosario chose DNR for herself when she completed paperwork several years ago.  Conference with attending, surgeon, bedside nursing staff, transition of care team related to patient condition, needs, goals of care, outpatient palliative.  Plan: Surgical hip repair 6/2.  Anticipate need for short-term rehab, possible long-term care.  Outpatient palliative services with hospice of [facility].  55 minutes Leslie Axe, NP Palliative medicine team Team phone [phone] Greater than 50% of this time was spent counseling and coordinating care related to the above assessment and plan.

## 2021-05-17 ENCOUNTER — Inpatient Hospital Stay (HOSPITAL_COMMUNITY): Payer: Medicare Other | Admitting: Certified Registered"

## 2021-05-17 ENCOUNTER — Inpatient Hospital Stay (HOSPITAL_COMMUNITY): Payer: Medicare Other

## 2021-05-17 ENCOUNTER — Encounter (HOSPITAL_COMMUNITY)
Admission: EM | Disposition: A | Discharge status: Nursing Home (Includes ICF) | Payer: Self-pay | Source: Skilled Nursing Facility | Attending: Family Medicine

## 2021-05-17 ENCOUNTER — Encounter (HOSPITAL_COMMUNITY): Payer: Self-pay | Admitting: Internal Medicine

## 2021-05-17 DIAGNOSIS — I10 Essential (primary) hypertension: Secondary | ICD-10-CM

## 2021-05-17 HISTORY — PX: HIP PINNING,CANNULATED: SHX1758

## 2021-05-17 LAB — BASIC METABOLIC PANEL
Anion gap: 13 (ref 5–15)
BUN: 53 mg/dL — ABNORMAL HIGH (ref 8–23)
CO2: 27 mmol/L (ref 22–32)
Calcium: 9.4 mg/dL (ref 8.9–10.3)
Chloride: 100 mmol/L (ref 98–111)
Creatinine, Ser: 1.57 mg/dL — ABNORMAL HIGH (ref 0.44–1.00)
GFR, Estimated: 30 mL/min — ABNORMAL LOW (ref >60)
Glucose, Bld: 120 mg/dL — ABNORMAL HIGH (ref 70–99)
Potassium: 3.9 mmol/L (ref 3.5–5.1)
Sodium: 140 mmol/L (ref 135–145)

## 2021-05-17 LAB — GLUCOSE, CAPILLARY
Glucose-Capillary: 140 mg/dL — ABNORMAL HIGH (ref 70–99)
Glucose-Capillary: 115 mg/dL — ABNORMAL HIGH (ref 70–99)
Glucose-Capillary: 113 mg/dL — ABNORMAL HIGH (ref 70–99)

## 2021-05-17 LAB — URINE CULTURE: Special Requests: NORMAL

## 2021-05-17 LAB — CBC
HCT: 36.9 % (ref 36.0–46.0)
Hemoglobin: 12 g/dL (ref 12.0–15.0)
MCH: 33.2 pg (ref 26.0–34.0)
MCHC: 32.5 g/dL (ref 30.0–36.0)
MCV: 102.2 fL — ABNORMAL HIGH (ref 80.0–100.0)
Platelets: 866 10*3/uL — ABNORMAL HIGH (ref 150–400)
RBC: 3.61 MIL/uL — ABNORMAL LOW (ref 3.87–5.11)
RDW: 16.8 % — ABNORMAL HIGH (ref 11.5–15.5)
WBC: 13 10*3/uL — ABNORMAL HIGH (ref 4.0–10.5)
nRBC: 0 % (ref 0.0–0.2)

## 2021-05-17 LAB — PROTIME-INR
INR: 1.6 — ABNORMAL HIGH (ref 0.8–1.2)
INR: 1.4 — ABNORMAL HIGH (ref 0.8–1.2)
Prothrombin Time: 18.6 seconds — ABNORMAL HIGH (ref 11.4–15.2)
Prothrombin Time: 17.3 seconds — ABNORMAL HIGH (ref 11.4–15.2)

## 2021-05-17 LAB — SURGICAL PCR SCREEN
MRSA, PCR: NEGATIVE
Staphylococcus aureus: NEGATIVE

## 2021-05-17 SURGERY — FIXATION, FEMUR, NECK, PERCUTANEOUS, USING SCREW
Anesthesia: Spinal | Site: Hip | Laterality: Right

## 2021-05-17 MED ORDER — MENTHOL 3 MG MT LOZG: 1.0000 | LOZENGE | OROMUCOSAL | Status: DC | PRN

## 2021-05-17 MED ORDER — SODIUM CHLORIDE 0.9 % IV SOLN: INTRAVENOUS | Status: DC

## 2021-05-17 MED ORDER — SUCCINYLCHOLINE CHLORIDE 200 MG/10ML IV SOSY
PREFILLED_SYRINGE | INTRAVENOUS | Status: AC
  Filled 2021-05-17: qty 10

## 2021-05-17 MED ORDER — MAGNESIUM CITRATE PO SOLN: 1.0000 | Freq: Once | ORAL | Status: DC | PRN

## 2021-05-17 MED ORDER — ACETAMINOPHEN 650 MG RE SUPP
650.0000 mg | Freq: Four times a day (QID) | RECTAL | Status: DC
  Administered 2021-05-17 – 2021-05-22 (×18): 650 mg via RECTAL
  Filled 2021-05-17 (×18): qty 1

## 2021-05-17 MED ORDER — METOCLOPRAMIDE HCL 5 MG/ML IJ SOLN: 5.0000 mg | Freq: Three times a day (TID) | INTRAMUSCULAR | Status: DC | PRN

## 2021-05-17 MED ORDER — BISACODYL 5 MG PO TBEC: 5.0000 mg | DELAYED_RELEASE_TABLET | Freq: Every day | ORAL | Status: DC | PRN

## 2021-05-17 MED ORDER — FENTANYL CITRATE (PF) 100 MCG/2ML IJ SOLN
INTRAMUSCULAR | Status: DC | PRN
  Administered 2021-05-17 (×2): 25 ug via INTRAVENOUS

## 2021-05-17 MED ORDER — PHENOL 1.4 % MT LIQD
1.0000 | OROMUCOSAL | Status: DC | PRN
Start: 2021-05-17 — End: 2021-05-22

## 2021-05-17 MED ORDER — BISACODYL 10 MG RE SUPP
10.0000 mg | Freq: Once | RECTAL | Status: AC
  Administered 2021-05-17: 10 mg via RECTAL
  Filled 2021-05-17: qty 1

## 2021-05-17 MED ORDER — BUPIVACAINE-EPINEPHRINE (PF) 0.5% -1:200000 IJ SOLN
INTRAMUSCULAR | Status: AC
  Filled 2021-05-17: qty 60

## 2021-05-17 MED ORDER — FENTANYL CITRATE (PF) 100 MCG/2ML IJ SOLN
INTRAMUSCULAR | Status: AC
  Filled 2021-05-17: qty 2

## 2021-05-17 MED ORDER — BUPIVACAINE HCL (PF) 0.5 % IJ SOLN
INTRAMUSCULAR | Status: AC
  Filled 2021-05-17: qty 30

## 2021-05-17 MED ORDER — ONDANSETRON HCL 4 MG/2ML IJ SOLN
INTRAMUSCULAR | Status: AC
  Filled 2021-05-17: qty 2

## 2021-05-17 MED ORDER — CHLORHEXIDINE GLUCONATE 0.12 % MT SOLN
15.0000 mL | Freq: Once | OROMUCOSAL | Status: AC
  Administered 2021-05-17: 15 mL via OROMUCOSAL
  Filled 2021-05-17: qty 15

## 2021-05-17 MED ORDER — ONDANSETRON HCL 4 MG/2ML IJ SOLN: 4.0000 mg | Freq: Four times a day (QID) | INTRAMUSCULAR | Status: DC | PRN

## 2021-05-17 MED ORDER — BUPIVACAINE-EPINEPHRINE (PF) 0.5% -1:200000 IJ SOLN
INTRAMUSCULAR | Status: DC | PRN
  Administered 2021-05-17: 60 mL via PERINEURAL

## 2021-05-17 MED ORDER — ORAL CARE MOUTH RINSE: 15.0000 mL | Freq: Once | OROMUCOSAL | Status: AC

## 2021-05-17 MED ORDER — VITAMIN K1 10 MG/ML IJ SOLN
2.0000 mg | Freq: Once | INTRAVENOUS | Status: AC
  Administered 2021-05-17: 2 mg via INTRAVENOUS
  Filled 2021-05-17: qty 0.2

## 2021-05-17 MED ORDER — ACETAMINOPHEN 500 MG PO TABS: 500.0000 mg | ORAL_TABLET | Freq: Four times a day (QID) | ORAL | Status: DC

## 2021-05-17 MED ORDER — METOCLOPRAMIDE HCL 10 MG PO TABS: 5.0000 mg | ORAL_TABLET | Freq: Three times a day (TID) | ORAL | Status: DC | PRN

## 2021-05-17 MED ORDER — METHOCARBAMOL 500 MG PO TABS: 500.0000 mg | ORAL_TABLET | Freq: Four times a day (QID) | ORAL | Status: DC | PRN

## 2021-05-17 MED ORDER — CEFAZOLIN SODIUM-DEXTROSE 2-4 GM/100ML-% IV SOLN
2.0000 g | Freq: Four times a day (QID) | INTRAVENOUS | Status: AC
  Administered 2021-05-17 – 2021-05-18 (×2): 2 g via INTRAVENOUS
  Filled 2021-05-17 (×2): qty 100

## 2021-05-17 MED ORDER — 0.9 % SODIUM CHLORIDE (POUR BTL) OPTIME
TOPICAL | Status: DC | PRN
  Administered 2021-05-17: 1000 mL

## 2021-05-17 MED ORDER — MUPIROCIN 2 % EX OINT
1.0000 "application " | TOPICAL_OINTMENT | Freq: Two times a day (BID) | CUTANEOUS | Status: DC
  Administered 2021-05-17: 1 via NASAL
  Filled 2021-05-17: qty 22

## 2021-05-17 MED ORDER — ARTIFICIAL TEARS OPHTHALMIC OINT
TOPICAL_OINTMENT | OPHTHALMIC | Status: AC
  Filled 2021-05-17: qty 3.5

## 2021-05-17 MED ORDER — MORPHINE SULFATE (PF) 2 MG/ML IV SOLN
0.5000 mg | INTRAVENOUS | Status: DC | PRN
Start: 2021-05-17 — End: 2021-05-17

## 2021-05-17 MED ORDER — METOPROLOL TARTRATE 5 MG/5ML IV SOLN
INTRAVENOUS | Status: AC
  Filled 2021-05-17: qty 5

## 2021-05-17 MED ORDER — METHOCARBAMOL 1000 MG/10ML IJ SOLN
500.0000 mg | Freq: Four times a day (QID) | INTRAVENOUS | Status: DC | PRN
  Filled 2021-05-17: qty 5

## 2021-05-17 MED ORDER — LACTATED RINGERS IV SOLN: INTRAVENOUS | Status: DC | PRN

## 2021-05-17 MED ORDER — DOCUSATE SODIUM 100 MG PO CAPS
100.0000 mg | ORAL_CAPSULE | Freq: Two times a day (BID) | ORAL | Status: DC
  Administered 2021-05-20 – 2021-05-21 (×2): 100 mg via ORAL
  Filled 2021-05-17 (×5): qty 1

## 2021-05-17 MED ORDER — ASPIRIN 300 MG RE SUPP
300.0000 mg | Freq: Every day | RECTAL | Status: AC
  Administered 2021-05-18 – 2021-05-19 (×2): 300 mg via RECTAL
  Filled 2021-05-17 (×2): qty 1

## 2021-05-17 MED ORDER — BUPIVACAINE HCL (PF) 0.5 % IJ SOLN
INTRAMUSCULAR | Status: DC | PRN
  Administered 2021-05-17: 2 mL

## 2021-05-17 MED ORDER — LACTATED RINGERS IV SOLN: INTRAVENOUS | Status: DC

## 2021-05-17 MED ORDER — PROPOFOL 10 MG/ML IV BOLUS
INTRAVENOUS | Status: DC | PRN
  Administered 2021-05-17: 10 ug/kg/min via INTRAVENOUS
  Administered 2021-05-17: 30 mg via INTRAVENOUS

## 2021-05-17 MED ORDER — LIDOCAINE HCL (CARDIAC) PF 50 MG/5ML IV SOSY
PREFILLED_SYRINGE | INTRAVENOUS | Status: DC | PRN
  Administered 2021-05-17: 1 mL via INTRAVENOUS

## 2021-05-17 MED ORDER — SENNOSIDES-DOCUSATE SODIUM 8.6-50 MG PO TABS: 1.0000 | ORAL_TABLET | Freq: Every evening | ORAL | Status: DC | PRN

## 2021-05-17 MED ORDER — METOPROLOL TARTRATE 5 MG/5ML IV SOLN
INTRAVENOUS | Status: DC | PRN
  Administered 2021-05-17: 1 mg via INTRAVENOUS

## 2021-05-17 MED ORDER — MORPHINE SULFATE (PF) 2 MG/ML IV SOLN
2.0000 mg | INTRAVENOUS | Status: DC | PRN
Start: 2021-05-17 — End: 2021-05-22

## 2021-05-17 MED ORDER — TRAMADOL HCL 50 MG PO TABS
50.0000 mg | ORAL_TABLET | Freq: Four times a day (QID) | ORAL | Status: DC | PRN
  Administered 2021-05-18: 50 mg via ORAL
  Filled 2021-05-17: qty 1

## 2021-05-17 MED ORDER — ONDANSETRON HCL 4 MG PO TABS: 4.0000 mg | ORAL_TABLET | Freq: Four times a day (QID) | ORAL | Status: DC | PRN

## 2021-05-17 MED ORDER — PROPOFOL 10 MG/ML IV BOLUS
INTRAVENOUS | Status: AC
  Filled 2021-05-17: qty 20

## 2021-05-17 SURGICAL SUPPLY — 54 items
BAG HAMPER (MISCELLANEOUS) ×2 IMPLANT
BIT DRILL 4.8X300 (BIT) ×2 IMPLANT
BLADE HEX COATED 2.75 (ELECTRODE) ×2 IMPLANT
BLADE SURG SZ10 CARB STEEL (BLADE) ×4 IMPLANT
BNDG GAUZE ELAST 4 BULKY (GAUZE/BANDAGES/DRESSINGS) ×2 IMPLANT
CHLORAPREP W/TINT 26 (MISCELLANEOUS) ×2 IMPLANT
CLOTH BEACON ORANGE TIMEOUT ST (SAFETY) ×2 IMPLANT
COVER LIGHT HANDLE STERIS (MISCELLANEOUS) ×4 IMPLANT
COVER MAYO STAND XLG (MISCELLANEOUS) ×1 IMPLANT
COVER PERINEAL POST (MISCELLANEOUS) ×2 IMPLANT
COVER WAND RF STERILE (DRAPES) ×2 IMPLANT
DECANTER SPIKE VIAL GLASS SM (MISCELLANEOUS) ×4 IMPLANT
DRAPE STERI IOBAN 125X83 (DRAPES) ×2 IMPLANT
DRESSING MEPILEX BORDER 6X8 (GAUZE/BANDAGES/DRESSINGS) ×1 IMPLANT
DRSG MEPILEX BORDER 4X4 (GAUZE/BANDAGES/DRESSINGS) ×1 IMPLANT
DRSG MEPILEX BORDER 6X8 (GAUZE/BANDAGES/DRESSINGS) ×2
DRSG MEPILEX SACRM 8.7X9.8 (GAUZE/BANDAGES/DRESSINGS) ×2 IMPLANT
GLOVE ORTHO TXT STRL SZ7.5 (GLOVE) ×1 IMPLANT
GLOVE SKINSENSE NS SZ8.0 LF (GLOVE) ×2
GLOVE SKINSENSE STRL SZ8.0 LF (GLOVE) ×2 IMPLANT
GLOVE SS N UNI LF 8.5 STRL (GLOVE) ×2 IMPLANT
GLOVE SURG ENC MOIS LTX SZ7 (GLOVE) ×2 IMPLANT
GLOVE SURG UNDER POLY LF SZ7 (GLOVE) ×4 IMPLANT
GOWN STRL REUS W/TWL LRG LVL3 (GOWN DISPOSABLE) ×2 IMPLANT
GOWN STRL REUS W/TWL XL LVL3 (GOWN DISPOSABLE) ×2 IMPLANT
INST SET MAJOR BONE (KITS) ×2 IMPLANT
KIT BLADEGUARD II DBL (SET/KITS/TRAYS/PACK) ×2 IMPLANT
KIT TURNOVER CYSTO (KITS) ×2 IMPLANT
MANIFOLD NEPTUNE II (INSTRUMENTS) ×2 IMPLANT
MARKER SKIN DUAL TIP RULER LAB (MISCELLANEOUS) ×2 IMPLANT
NDL HYPO 21X1.5 SAFETY (NEEDLE) ×1 IMPLANT
NDL SPNL 18GX3.5 QUINCKE PK (NEEDLE) ×1 IMPLANT
NEEDLE HYPO 21X1.5 SAFETY (NEEDLE) ×2 IMPLANT
NEEDLE SPNL 18GX3.5 QUINCKE PK (NEEDLE) ×2 IMPLANT
NS IRRIG 1000ML POUR BTL (IV SOLUTION) ×2 IMPLANT
PACK BASIC III (CUSTOM PROCEDURE TRAY) ×2
PACK SRG BSC III STRL LF ECLPS (CUSTOM PROCEDURE TRAY) ×1 IMPLANT
PAD ABD 5X9 TENDERSORB (GAUZE/BANDAGES/DRESSINGS) ×2 IMPLANT
PENCIL SMOKE EVACUATOR COATED (MISCELLANEOUS) ×2 IMPLANT
PIN GUIDE DRILL TIP 2.8X300 (DRILL) ×6 IMPLANT
SCREW CANN 6.5X85X40MM THRD (Screw) ×1 IMPLANT
SCREW CANNULATED 6.5X75MM (Screw) ×2 IMPLANT
SET BASIN LINEN APH (SET/KITS/TRAYS/PACK) ×2 IMPLANT
SPONGE LAP 18X18 RF (DISPOSABLE) ×4 IMPLANT
STAPLER VISISTAT 35W (STAPLE) IMPLANT
STRIP CLOSURE SKIN 1/2X4 (GAUZE/BANDAGES/DRESSINGS) ×1 IMPLANT
SUT BRALON NAB BRD #1 30IN (SUTURE) ×2 IMPLANT
SUT MNCRL 0 VIOLET CTX 36 (SUTURE) ×1 IMPLANT
SUT MON AB 2-0 CT1 36 (SUTURE) ×1 IMPLANT
SUT MONOCRYL 0 CTX 36 (SUTURE) ×2
SYR 30ML LL (SYRINGE) ×2 IMPLANT
SYR BULB IRRIG 60ML STRL (SYRINGE) ×4 IMPLANT
WASHER FLAT 6.5MM (Washer) ×3 IMPLANT
YANKAUER SUCT BULB TIP 10FT TU (MISCELLANEOUS) ×2 IMPLANT

## 2021-05-17 NOTE — Op Note (Signed)
OPERATIVE REPORT  05/17/2021  2:26 PM  PATIENT:  Leslie Rosario  86 y.o. female  PRE-OPERATIVE DIAGNOSIS:  right femoral neck fracture  POST-OPERATIVE DIAGNOSIS:  right femoral neck fracture  PROCEDURE:  Procedure(s): CANNULATED HIP PINNING (Right)   Fully threaded 6.5 mm screws with washers titanium/  SURGEON:  Surgeon(s) and Role:    Carole Civil, MD - Primary   Surgeon Aline Brochure   Anesthesia spinal  Findings at surgery non displaced right  femoral neck fracture   Details of procedure  The patient was identified in the preoperative area, the chart was reviewed. The x-rays were reviewed. The surgical site was confirmed and marked.  The patient was then taken to the operating room where she was given a spinal anesthetic and placed on the fracture table. The injured leg was placed in a traction device and the well leg was abducted flexed and externally rotated at the hip placed in a padded leg holder.  Preliminary timeout to confirm x-ray gown and badges was performed and the C-arm was brought in and radiographs were taken in multiple planes confirming the fracture to be reduced and adequate radiographs could be obtained.  The leg was then prepped and draped appropriately with ChloraPrep   Timeout was completed surgical site was confirmed. Implants had been checked prior to bringing the patient back to the room.  The incision was made starting just below the greater trochanter extended distally; subcutaneous tissue was divided in line with the skin incision. The vastus lateralis fascia was split and any bleeding vessels were coagulated.  Subperiosteal dissection continued until the proximal femur was exposed. A multi hole pin guide was placed on the bone and to threaded guidewires were placed in the superior portion of the femoral head and neck. These were adjusted until there were in the appropriate position. A third pin was placed to form an inverted triangle  configuration at the base of the femoral neck.  Each pin was independently measured followed by drilling of the near cortex and insertion of appropriate screws with washers.  Each screw was checked with AP and lateral x-rays using oblique x-rays were needed.  The wound was irrigated with copious amounts of saline and then closure was performed  Closure was performed with 0 Monocryl suture in 3 layers. Marcaine with epinephrine was injected in the subfascial layers time 60 mL.  The skin was reapproximated with staples  The postoperative plan is for immediate weightbearing as tolerated  Radiographs at 2, 6 and 12 weeks.  DVT prophylaxis will be provided as well for one month.  Post op appt in 1 month   subq stitches   CPT code [phone]

## 2021-05-17 NOTE — Transfer of Care (Signed)
Immediate Anesthesia Transfer of Care Note  Patient: Leslie Rosario  Procedure(s) Performed: CANNULATED HIP PINNING (Right Hip)  Patient Location: PACU  Anesthesia Type:Spinal  Level of Consciousness: awake, patient cooperative, confused and responds to stimulation  Airway & Oxygen Therapy: Patient Spontanous Breathing and Patient connected to nasal cannula oxygen  Post-op Assessment: Report given to RN, Post -op Vital signs reviewed and stable and Patient moving all extremities  Post vital signs: Reviewed and stable  Last Vitals:  Vitals Value Taken Time  BP 154/99 05/17/21 1422  Temp    Pulse 94 05/17/21 1423  Resp 11 05/17/21 1423  SpO2 97 % 05/17/21 1423  Vitals shown include unvalidated device data.  Last Pain:  Vitals:   05/17/21 1138  TempSrc: Oral  PainSc:          Complications: No complications documented.

## 2021-05-17 NOTE — Care Management Important Message (Signed)
Important Message  Patient Details  Name: Leslie Rosario MRN: [id number] Date of Birth: 06-01-1923   Medicare Important Message Given:  Yes     Tommy Medal 05/17/2021, 1:07 PM

## 2021-05-17 NOTE — Anesthesia Postprocedure Evaluation (Signed)
Anesthesia Post Note  Patient: Leslie Rosario  Procedure(s) Performed: CANNULATED HIP PINNING (Right Hip)  Patient location during evaluation: Phase II Anesthesia Type: Spinal Level of consciousness: awake Pain management: pain level controlled Vital Signs Assessment: post-procedure vital signs reviewed and stable Respiratory status: spontaneous breathing and respiratory function stable Cardiovascular status: blood pressure returned to baseline and stable Postop Assessment: no headache and no apparent nausea or vomiting Anesthetic complications: no Comments: Late entry   No complications documented.   Last Vitals:  Vitals:   05/17/21 1515 05/17/21 1544  BP: (!) 177/81 (!) 159/81  Pulse: 87 86  Resp: 10 20  Temp:  36.6 C  SpO2: 98% 92%    Last Pain:  Vitals:   05/17/21 1544  TempSrc: Axillary  PainSc:                  [name]

## 2021-05-17 NOTE — Anesthesia Preprocedure Evaluation (Signed)
Anesthesia Evaluation  Patient identified by MRN, date of birth, ID band Patient confused    Reviewed: Allergy & Precautions, H&P , NPO status , Patient's Chart, lab work & pertinent test results, reviewed documented beta blocker date and time , Unable to perform ROS - Chart review only  Airway Mallampati: II  TM Distance: >3 FB Neck ROM: full    Dental no notable dental hx.    Pulmonary shortness of breath, former smoker,    Pulmonary exam normal breath sounds clear to auscultation       Cardiovascular Exercise Tolerance: Good hypertension, negative cardio ROS   Rhythm:regular Rate:Normal     Neuro/Psych PSYCHIATRIC DISORDERS Anxiety Depression negative neurological ROS     GI/Hepatic Neg liver ROS, GERD  Medicated,  Endo/Other  Hypothyroidism   Renal/GU negative Renal ROS  negative genitourinary   Musculoskeletal   Abdominal   Peds  Hematology negative hematology ROS (+)   Anesthesia Other Findings 1. Left ventricular ejection fraction, by estimation, is 60 to 65%. The left ventricle has normal function. Left ventricular endocardial border not optimally defined to evaluate regional wall motion. Left ventricular diastolic parameters are indeterminate. 2. Right ventricular systolic function was not well visualized. The right ventricular size is not well visualized. There is normal pulmonary artery systolic pressure. 3. The mitral valve was not well visualized. Mild mitral valve regurgitation. No evidence of mitral stenosis. 4. The aortic valve is tricuspid. Aortic valve regurgitation is not visualized. No aortic stenosis is present. 5. Limited views obtaine due to limitations on patient positioning.  Reproductive/Obstetrics negative OB ROS                             Anesthesia Physical Anesthesia Plan  ASA: III  Anesthesia Plan: Spinal   Post-op Pain Management:    Induction:    PONV Risk Score and Plan: Propofol infusion  Airway Management Planned:   Additional Equipment:   Intra-op Plan:   Post-operative Plan:   Informed Consent: I have reviewed the patients History and Physical, chart, labs and discussed the procedure including the risks, benefits and alternatives for the proposed anesthesia with the patient or authorized representative who has indicated his/her understanding and acceptance.     Dental Advisory Given  Plan Discussed with: CRNA  Anesthesia Plan Comments:         Anesthesia Quick Evaluation

## 2021-05-17 NOTE — Anesthesia Procedure Notes (Signed)
Spinal  Patient location during procedure: OR Reason for block: surgical anesthesia Staffing Performed: resident/CRNA  Anesthesiologist: [name], MD Resident/CRNA: Tacy Learn, CRNA Preanesthetic Checklist Completed: patient identified, IV checked, site marked, risks and benefits discussed, surgical consent, monitors and equipment checked, pre-op evaluation and timeout performed Spinal Block Patient position: left lateral decubitus Prep: ChloraPrep Patient monitoring: heart rate, cardiac monitor, continuous pulse ox and blood pressure Approach: left paramedian Location: L3-4 Injection technique: single-shot Needle Needle type: Quincke  Needle gauge: 22 G Assessment Sensory level: T4 Events: CSF return

## 2021-05-17 NOTE — Interval H&P Note (Signed)
History and Physical Interval Note:  05/17/2021 12:23 PM  Leslie Rosario  has presented today for surgery, with the diagnosis of right femoral neck fracture.  The various methods of treatment have been discussed with the patient and family. After consideration of risks, benefits and other options for treatment, the patient has consented to  Procedure(s): CANNULATED HIP PINNING (Right) as a surgical intervention.  The patient's history has been reviewed, patient examined, no change in status, stable for surgery.  I have reviewed the patient's chart and labs.  Questions were answered to the patient's satisfaction.     Arther Abbott

## 2021-05-17 NOTE — Progress Notes (Signed)
ANTICOAGULATION CONSULT NOTE - Initial Consult  Pharmacy Consult for warfarin Indication: atrial fibrillation  Allergies  Allergen Reactions  . Adhesive [Tape] Other (See Comments)    REACTION: pulls skin  . Codeine     UNKNOWN REACTION  . Prednisone     UNKNOWN  . Sulfonamide Derivatives     UNKNOWN REACTION  . Vicodin [Hydrocodone-Acetaminophen]     "Intolerance "    Patient Measurements:   Heparin Dosing Weight:   Vital Signs: Temp: 97.7 F (36.5 C) (06/03 1430) Temp Source: Oral (06/03 1138) BP: 146/72 (06/03 1430) Pulse Rate: 93 (06/03 1430)  Labs: Recent Labs    05/15/21 0440 05/16/21 0551 05/17/21 0623 05/17/21 1200  HGB 12.3 11.4* 12.0  --   HCT 37.3 34.9* 36.9  --   PLT 847* 744* 866*  --   LABPROT 25.1* 18.7* 18.6* 17.3*  INR 2.3* 1.6* 1.6* 1.4*  CREATININE 1.69* 1.63* 1.57*  --     CrCl cannot be calculated (Unknown ideal weight.).   Medical History: Past Medical History:  Diagnosis Date  . Anxiety   . Arthritis   . Atrial fibrillation ([place])   . Constipation   . Depression   . Dyspnea   . Dysrhythmia    A-Fib  . GERD (gastroesophageal reflux disease)   . Hyperlipidemia   . Hypertension   . Hypothyroidism   . Nausea & vomiting   . T12 compression fracture ([place]) 06/2017  . Urinary frequency     Medications:  Medications Prior to Admission  Medication Sig Dispense Refill Last Dose  . ALPRAZolam (XANAX) 0.5 MG tablet Take 1 tablet (0.5 mg total) by mouth at bedtime as needed for anxiety or sleep.  0 04/25/2021 at 1735  . bisacodyl (DULCOLAX) 10 MG suppository Place 10 mg rectally every three (3) days as needed for moderate constipation.   PRN  . DULoxetine (CYMBALTA) 20 MG capsule Take 20 mg by mouth daily.   05/13/2021 at [place]  . feeding supplement, ENSURE ENLIVE, (ENSURE ENLIVE) LIQD Take 237 mLs by mouth 2 (two) times daily between meals. (Patient taking differently: Take 237 mLs by mouth daily.)   05/13/2021 at 0947  . furosemide  (LASIX) 20 MG tablet Take 20 mg by mouth daily.   05/13/2021 at [place]  . HYDROcodone-acetaminophen (NORCO/VICODIN) 5-325 MG tablet Take 1 tablet by mouth 2 (two) times daily as needed.   04/25/2021 at 1936  . levothyroxine (SYNTHROID) 88 MCG tablet Take 88 mcg by mouth daily.   05/13/2021 at [place]  . loratadine (CLARITIN) 5 MG chewable tablet [id number]  loratadine 5 mg tablet Administer 1 tab(s) By mouth once a day   05/13/2021 at [place]  . Magnesium Oxide -Mg Supplement 250 MG TABS Take 1 tablet by mouth at bedtime.   05/13/2021 at [place]  . pantoprazole (PROTONIX) 40 MG tablet Take 40 mg by mouth daily.   05/13/2021 at [place]  . polyethylene glycol (MIRALAX / GLYCOLAX) 17 g packet Take 17 g by mouth daily as needed.   02/25/2021 at 0900  . potassium chloride (MICRO-K) 10 MEQ CR capsule Take 10 mEq by mouth daily.   05/13/2021 at [place]  . simvastatin (ZOCOR) 10 MG tablet Take 10 mg by mouth at bedtime.   05/13/2021 at [place]  . sucralfate (CARAFATE) 1 g tablet Take 1 g by mouth 4 (four) times daily.   05/13/2021 at 1650  . warfarin (COUMADIN) 3 MG tablet Take 3 mg by mouth daily. Every Mon, Wed, Fri, Sat  05/13/2021 at [place]  . warfarin (COUMADIN) 6 MG tablet Take 6 mg by mouth. Every [place], Sun   05/13/2021 at 1839  . hydroxyurea (HYDREA) 500 MG capsule Take 1 capsule (500 mg total) by mouth every other day. May take with food to minimize GI side effects. (Patient not taking: No sig reported) 30 capsule 6 Not Taking at Unknown time  . prochlorperazine (COMPAZINE) 5 MG tablet Take 1 tablet (5 mg total) by mouth every 6 (six) hours as needed for nausea or vomiting. (Patient not taking: No sig reported) 60 tablet 3     Assessment: Pharmacy consulted to dose warfarin in patient with atrial fibrillation.  Patient completed procedure 6/3 with plans of restarting warfarin on 6/4.  Home dose listed as 3 mg every M-W-F-Sat and 6 mg ROW.   Goal of Therapy:  INR 2-3 Monitor platelets by anticoagulation protocol: Yes    Plan:  Start warfarin tomorrow 6/4  Ramond Craver 05/17/2021,2:47 PM

## 2021-05-17 NOTE — TOC Progression Note (Signed)
Transition of Care [facility]) - Progression Note   Patient Details  Name: Leslie Rosario MRN: [id number] Date of Birth: 1923-07-14  Transition of [facility]) CM/SW [place], LCSW Phone Number: 05/17/2021, 1:52 PM  Clinical Narrative: [facility] made bed offer for patient and will contact family to complete admission paperwork. TOC to start insurance authorization pending PT evaluation.  Expected Discharge Plan: Skilled Nursing Facility Barriers to Discharge: Continued Medical Work up  Expected Discharge Plan and Services Expected Discharge Plan: [place]  Readmission Risk Interventions Readmission Risk Prevention Plan 05/16/2021  Transportation Screening Complete  HRI or Home Care Consult Complete  Social Work Consult for [place] Planning/Counseling Complete  Palliative Care Screening Not Applicable  Medication Review Press photographer) Complete  Some recent data might be hidden

## 2021-05-17 NOTE — Progress Notes (Signed)
Patient Demographics:    Leslie Rosario, is a 87 y.o. female, DOB - [birth year], YIR:[id number]  Admit date - 05/14/2021   Admitting Physician Ejiroghene Arlyce Dice, MD  Outpatient Primary MD for the patient is Celene Squibb, MD  LOS - 3   Chief Complaint  Patient presents with  . Fall        Subjective:    Leslie Rosario today has no fevers, no emesis,  No chest pain,   -Sleepy postop, swallowing difficulties and concerns persist  Assessment  & Plan :    Principal Problem:   Closed right hip fracture [facility]) Active Problems:   Atrial fibrillation ([place])   Long term current use of anticoagulant therapy   Acute metabolic encephalopathy   Dysphagia   Hypothyroidism   Essential hypertension   AMS (altered mental status)  Brief Summary:- 87 y.o. female with medical history significant for atrial fibrillation on coumadin, mitral regurgitation, HTN admitted on 05/14/2021 with Right Hip Fx after a mechanical fall, subsequently after 10 PM on 05/14/2020 developed unresponsive episode presumably after getting fentanyl in the ED previously, was found to be in flash pulmonary edema responded well to IV Lasix   A/p 1) acute metabolic encephalopathy--- unresponsive episode after 10 PM on 05/14/2021--- more awake, more alert, more interactive CT head and CT C-spine without acute findings-- -Suspect this was related to IV fentanyl and transient hemodynamic compromise -According to patient's son who is at bedside patient appears back to baseline  2)HFpEF--- patient appears to have chronic diastolic dysfunction CHF,  --echo from 09/13/2017 with EF of 60 to 65%, on 05/14/2021 patient had "flash pulmonary edema" after 10 PM after receiving IV fentanyl previously in the ED -- please see chest x-ray report, she responded well to IV Lasix -Continue Lasix -Updated echo requested -Repeat chest x-ray on 05/16/2021 shows resolving  edema -Monitor fluid intake and output and daily weight carefully especially given underlying CKD 4  3) chronic atrial fibrillation--hold Coumadin to allow for hip surgery, INR currently down to 1.6, metoprolol 25 mg p.o. twice daily for rate control  4) chronic anticoagulation--- in the setting of chronic A. fib, continue to hold Coumadin to allow for right hip surgery,  -INR 1.6  5)AKI----acute kidney injury on CKD stage -iV   creatinine on admission= 2.07  , baseline creatinine =  1.7 to 1.8   ,  -creatinine is now=  157 (renal function appears to be back to baseline) - renally adjust medications, avoid nephrotoxic agents / dehydration  / hypotension --Monitor fluid intake and output and daily weight carefully especially given underlying CKD 4  6)Leukocytosis-- ??  Reactive secondary to hip fracture and flash pulmonary edema -No fevers or other evidence of acute infection at this time WBC 9.7>> 18.8 >> 18.7>> 12.1 Urine and blood cultures- NGTD --no antibiotics indicated at this time  7)Dysphagia/Swallowing concerns--- speech eval was requested, aspiration precautions -Mostly n.p.o. for now awaiting speech reeval on 05/18/2021  8)Right Hip Fracture--- discussed with orthopedic surgeon Dr. Arther Abbott  S/p ORIF on 05/17/2021 (CANNULATED HIP PINNING (Right) - -The postoperative plan is for immediate weightbearing as tolerated Radiographs at 2, 6 and 12 weeks. DVT prophylaxis for one month. Post op appt in 1 month , subq stitches  9)Hypothyroidism--- continue levothyroxine 88 mcg daily,  TSH 0.935  10)Thrombocytosis --platelets appears to be close to her baseline,  Follows with Dr. Delton Coombes -Continue hydroxyurea.  11) social/ethics--patient is a DNR/DNI plan of care discussed with patient's son [name] at bedside  Disposition/Need for in-Hospital Stay- patient unable to be discharged at this time due to --hip fracture requiring ORIF after further medical stabilization given  flash pulmonary edema and need for additional IV diuresis and close monitoring of electrolytes and renal function*  Status is: Inpatient  Remains inpatient appropriate because:Please see disposition above   Disposition: The patient is from: ALF [facility]              Anticipated d/c is to: SNF              Anticipated d/c date is: 2 days              Patient currently is not medically stable to d/c. Barriers: Not Clinically Stable-   Code Status :  -  Code Status: DNR   Family Communication:   Discussed with son [name]  Consults  :  Ortho/speech  DVT Prophylaxis  :   - SCDs   SCDs Start: 05/17/21 1543 SCDs Start: 05/14/21 2032   Lab Results  Component Value Date   PLT 866 (H) 05/17/2021    Inpatient Medications  Scheduled Meds: . acetaminophen  650 mg Rectal QID  . bisacodyl  10 mg Rectal Once  . chlorhexidine  15 mL Mouth/Throat Once   Or  . mouth rinse  15 mL Mouth Rinse Once  . docusate sodium  100 mg Oral BID   Continuous Infusions: . sodium chloride 75 mL/hr at 05/17/21 1601  .  ceFAZolin (ANCEF) IV    . lactated ringers Stopped (05/17/21 1604)  . methocarbamol (ROBAXIN) IV     PRN Meds:.bisacodyl, magnesium citrate, menthol-cetylpyridinium **OR** phenol, methocarbamol **OR** methocarbamol (ROBAXIN) IV, metoCLOPramide **OR** metoCLOPramide (REGLAN) injection, morphine injection, ondansetron **OR** ondansetron (ZOFRAN) IV, traMADol    Anti-infectives (From admission, onward)   Start     Dose/Rate Route Frequency Ordered Stop   05/17/21 1930  ceFAZolin (ANCEF) IVPB 2g/100 mL premix        2 g 200 mL/hr over 30 Minutes Intravenous Every 6 hours 05/17/21 1542 05/18/21 0729   05/17/21 0600  ceFAZolin (ANCEF) IVPB 2g/100 mL premix        2 g 200 mL/hr over 30 Minutes Intravenous On call to O.R. 05/16/21 1259 05/17/21 1322   05/15/21 0600  ceFAZolin (ANCEF) IVPB 2g/100 mL premix        2 g 200 mL/hr over 30 Minutes Intravenous On call to O.R. 05/14/21 2315  05/16/21 0559        Objective:   Vitals:   05/17/21 1445 05/17/21 1500 05/17/21 1515 05/17/21 1544  BP: (!) 152/74 (!) 145/75 (!) 177/81 (!) 159/81  Pulse: 84 91 87 86  Resp: 20 (!) 23 10 20   Temp:    97.8 F (36.6 C)  TempSrc:    Axillary  SpO2: 96% 95% 98% 92%    Wt Readings from Last 3 Encounters:  04/17/21 45.2 kg  02/20/21 46.8 kg  01/22/21 46.6 kg     Intake/Output Summary (Last 24 hours) at 05/17/2021 1730 Last data filed at 05/17/2021 1413 Gross per 24 hour  Intake 820 ml  Output 325 ml  Net 495 ml     Physical Exam  Gen:- Awake Alert,  In no apparent distress  HEENT:- [place].AT,  No sclera icterus Neck-Supple Neck,No JVD,.  Lungs-improved air movement, no rales, no wheezing CV- S1, S2 normal, irregularly irregular  abd-  +ve B.Sounds, Abd Soft, No tenderness,    Extremity/Skin:- No  edema, pedal pulses present  Psych -underlying/baseline cognitive and memory deficits  neuro-generalized weakness without additional new focal deficits, no tremors MSK-postop wound intact,, point tenderness on palpation over Rt hip area   Data Review:   Micro Results Recent Results (from the past 240 hour(s))  Resp Panel by RT-PCR (Flu A&B, Covid) Nasopharyngeal Swab     Status: None   Collection Time: 05/14/21  6:24 PM   Specimen: Nasopharyngeal Swab; Nasopharyngeal(NP) swabs in vial transport medium  Result Value Ref Range Status   SARS Coronavirus 2 by RT PCR NEGATIVE NEGATIVE Final    Comment: (NOTE) SARS-CoV-2 target nucleic acids are NOT DETECTED.  The SARS-CoV-2 RNA is generally detectable in upper respiratory specimens during the acute phase of infection. The lowest concentration of SARS-CoV-2 viral copies this assay can detect is 138 copies/mL. A negative result does not preclude SARS-Cov-2 infection and should not be used as the sole basis for treatment or other patient management decisions. A negative result may occur with  improper specimen collection/handling,  submission of specimen other than nasopharyngeal swab, presence of viral mutation(s) within the areas targeted by this assay, and inadequate number of viral copies(<138 copies/mL). A negative result must be combined with clinical observations, patient history, and epidemiological information. The expected result is Negative.  Fact Sheet for Patients:  EntrepreneurPulse.com.au  Fact Sheet for Healthcare Providers:  IncredibleEmployment.be  This test is no t yet approved or cleared by the [place] FDA and  has been authorized for detection and/or diagnosis of SARS-CoV-2 by FDA under an Emergency Use Authorization (EUA). This EUA will remain  in effect (meaning this test can be used) for the duration of the COVID-19 declaration under Section 564(b)(1) of the Act, 21 U.S.C.section 360bbb-3(b)(1), unless the authorization is terminated  or revoked sooner.       Influenza A by PCR NEGATIVE NEGATIVE Final   Influenza B by PCR NEGATIVE NEGATIVE Final    Comment: (NOTE) The Xpert Xpress SARS-CoV-2/FLU/RSV plus assay is intended as an aid in the diagnosis of influenza from Nasopharyngeal swab specimens and should not be used as a sole basis for treatment. Nasal washings and aspirates are unacceptable for Xpert Xpress SARS-CoV-2/FLU/RSV testing.  Fact Sheet for Patients: EntrepreneurPulse.com.au  Fact Sheet for Healthcare Providers: IncredibleEmployment.be  This test is not yet approved or cleared by the [place] FDA and has been authorized for detection and/or diagnosis of SARS-CoV-2 by FDA under an Emergency Use Authorization (EUA). This EUA will remain in effect (meaning this test can be used) for the duration of the COVID-19 declaration under Section 564(b)(1) of the Act, 21 U.S.C. section 360bbb-3(b)(1), unless the authorization is terminated or revoked.  Performed at [facility], [address] [place], [place] [address]   MRSA PCR Screening     Status: None   Collection Time: 05/14/21 10:58 PM   Specimen: Nasal Mucosa; Nasopharyngeal  Result Value Ref Range Status   MRSA by PCR NEGATIVE NEGATIVE Final    Comment:        The GeneXpert MRSA Assay (FDA approved for NASAL specimens only), is one component of a comprehensive MRSA colonization surveillance program. It is not intended to diagnose MRSA infection nor to guide or monitor treatment for MRSA infections. Performed at [facility], [address]  [place], [place] [address]   Urine Culture     Status: Abnormal   Collection Time: 05/15/21  6:01 PM   Specimen: Urine, Catheterized  Result Value Ref Range Status   Specimen Description   Final    URINE, CATHETERIZED Performed at [facility], [address] [place], [place] [address]    Special Requests   Final    Normal Performed at [facility], [address] [place], [place] [address]    Culture MULTIPLE SPECIES PRESENT, SUGGEST RECOLLECTION (A)  Final   Report Status 05/17/2021 FINAL  Final  Culture, blood (Routine X 2) w Reflex to ID Panel     Status: None (Preliminary result)   Collection Time: 05/15/21 11:26 PM   Specimen: BLOOD RIGHT FOREARM  Result Value Ref Range Status   Specimen Description BLOOD RIGHT FOREARM  Final   Special Requests   Final    BOTTLES DRAWN AEROBIC AND ANAEROBIC Blood Culture adequate volume   Culture   Final    NO GROWTH 2 DAYS Performed at [facility], [address], [place], [place] [address]    Report Status PENDING  Incomplete  Culture, blood (Routine X 2) w Reflex to ID Panel     Status: None (Preliminary result)   Collection Time: 05/15/21 11:29 PM   Specimen: BLOOD  Result Value Ref Range Status   Specimen Description BLOOD RIGHT ANTECUBITAL  Final   Special Requests   Final    BOTTLES DRAWN AEROBIC AND ANAEROBIC Blood Culture adequate volume   Culture   Final    NO GROWTH 2 DAYS Performed at [facility],  [address], [place], [place] [address]    Report Status PENDING  Incomplete  Surgical PCR screen     Status: None   Collection Time: 05/17/21  5:51 AM   Specimen: Nasal Mucosa; Nasal Swab  Result Value Ref Range Status   MRSA, PCR NEGATIVE NEGATIVE Final   Staphylococcus aureus NEGATIVE NEGATIVE Final    Comment: (NOTE) The Xpert SA Assay (FDA approved for NASAL specimens in patients 26 years of age and older), is one component of a comprehensive surveillance program. It is not intended to diagnose infection nor to guide or monitor treatment. Performed at [facility], [address], [place], [place] [address]     Radiology Reports DG Chest 1 View  Result Date: 05/14/2021 CLINICAL DATA:  Fall, hip pain EXAM: CHEST  1 VIEW COMPARISON:  11/12/2018 FINDINGS: Heart is borderline in size. Peribronchial thickening, vascular congestion and interstitial prominence. This could reflect interstitial edema. No confluent opacities or effusions. IMPRESSION: Bronchitic changes. Suspect mild vascular congestion and interstitial edema. Electronically Signed   By: Rolm Baptise M.D.   On: 05/14/2021 18:19   CT HEAD WO CONTRAST  Result Date: 05/14/2021 CLINICAL DATA:  87 year old female with encephalopathy. EXAM: CT HEAD WITHOUT CONTRAST TECHNIQUE: Contiguous axial images were obtained from the base of the skull through the vertex without intravenous contrast. COMPARISON:  Head CT dated 05/14/2021. FINDINGS: Brain: Moderate age-related atrophy and chronic microvascular ischemic changes. There is no acute intracranial hemorrhage. No mass effect or midline shift. No extra-axial fluid collection. Vascular: No hyperdense vessel or unexpected calcification. Skull: Normal. Negative for fracture or focal lesion. Sinuses/Orbits: Right maxillary sinus retention cyst or polyp. The remainder of the visualized paranasal sinuses and mastoid air cells are clear. Other: None IMPRESSION: 1. No acute intracranial pathology. 2.  Moderate age-related atrophy and chronic microvascular ischemic changes. Electronically Signed   By: Milas Hock  Radparvar M.D.   On: 05/14/2021 23:01   CT Head Wo Contrast  Result Date: 05/14/2021 CLINICAL DATA:  Status post fall out of bed.  Facial laceration. EXAM: CT HEAD WITHOUT CONTRAST CT CERVICAL SPINE WITHOUT CONTRAST TECHNIQUE: Multidetector CT imaging of the head and cervical spine was performed following the standard protocol without intravenous contrast. Multiplanar CT image reconstructions of the cervical spine were also generated. COMPARISON:  Head CT 09/12/2017.  CT neck 10/10/2016. FINDINGS: CT HEAD FINDINGS Brain: No evidence of acute infarction, hemorrhage, hydrocephalus, extra-axial collection or mass lesion/mass effect. Vascular: No hyperdense vessel or unexpected calcification. Skull: Intact.  No focal lesion. Sinuses/Orbits: Wall and mucosal thickening in the right maxillary sinus consistent with chronic sinus disease appear unchanged. Mild ethmoid air cell disease on left noted. Other: None. CT CERVICAL SPINE FINDINGS Alignment: Normal. Skull base and vertebrae: No acute fracture. No primary bone lesion or focal pathologic process. Soft tissues and spinal canal: No prevertebral fluid or swelling. No visible canal hematoma. Disc levels: Mild multilevel loss of disc space height and facet arthropathy noted. Upper chest: There is interlobular septal thickening and ground-glass attenuation in the lung apices. Other: None. IMPRESSION: No acute abnormality head or cervical spine. Appearance of the lung apices suggestive of interstitial edema. Electronically Signed   By: Inge Rise M.D.   On: 05/14/2021 17:54   CT Cervical Spine Wo Contrast  Result Date: 05/14/2021 CLINICAL DATA:  Status post fall out of bed.  Facial laceration. EXAM: CT HEAD WITHOUT CONTRAST CT CERVICAL SPINE WITHOUT CONTRAST TECHNIQUE: Multidetector CT imaging of the head and cervical spine was performed following the  standard protocol without intravenous contrast. Multiplanar CT image reconstructions of the cervical spine were also generated. COMPARISON:  Head CT 09/12/2017.  CT neck 10/10/2016. FINDINGS: CT HEAD FINDINGS Brain: No evidence of acute infarction, hemorrhage, hydrocephalus, extra-axial collection or mass lesion/mass effect. Vascular: No hyperdense vessel or unexpected calcification. Skull: Intact.  No focal lesion. Sinuses/Orbits: Wall and mucosal thickening in the right maxillary sinus consistent with chronic sinus disease appear unchanged. Mild ethmoid air cell disease on left noted. Other: None. CT CERVICAL SPINE FINDINGS Alignment: Normal. Skull base and vertebrae: No acute fracture. No primary bone lesion or focal pathologic process. Soft tissues and spinal canal: No prevertebral fluid or swelling. No visible canal hematoma. Disc levels: Mild multilevel loss of disc space height and facet arthropathy noted. Upper chest: There is interlobular septal thickening and ground-glass attenuation in the lung apices. Other: None. IMPRESSION: No acute abnormality head or cervical spine. Appearance of the lung apices suggestive of interstitial edema. Electronically Signed   By: Inge Rise M.D.   On: 05/14/2021 17:54   DG CHEST PORT 1 VIEW  Result Date: 05/16/2021 CLINICAL DATA:  87 year old female with shortness of breath. EXAM: PORTABLE CHEST 1 VIEW COMPARISON:  Portable chest 05/14/2021 and earlier. FINDINGS: Portable AP semi upright view at 0521 hours. Larger lung volumes and decreased bilateral pulmonary interstitial opacity. Lung markings appear to be near baseline now. Stable cardiac size and mediastinal contours. No definite cardiomegaly. Visualized tracheal air column is within normal limits. No pneumothorax or pleural effusion. Sequelae of thoracolumbar junction region vertebral plasty. Stable visualized osseous structures. Paucity of bowel gas in the upper abdomen. IMPRESSION: Improved lung volumes and  ventilation since 05/14/2020 with resolving edema or less likely viral/atypical infection. Electronically Signed   By: Genevie Ann M.D.   On: 05/16/2021 06:34   DG CHEST PORT 1 VIEW  Result Date: 05/14/2021 CLINICAL DATA:  Unresponsive EXAM: PORTABLE CHEST 1 VIEW COMPARISON:  05/14/2021 FINDINGS: Cardiac shadow is stable. Aortic calcifications are again seen. Lungs are well aerated bilaterally. Increasing interstitial edema is noted. No confluent infiltrate or effusion is noted. Changes of prior vertebral augmentation are again seen. IMPRESSION: Increasing pulmonary edema. Electronically Signed   By: Inez Catalina M.D.   On: 05/14/2021 23:18   ECHOCARDIOGRAM COMPLETE  Result Date: 05/15/2021    ECHOCARDIOGRAM REPORT   Patient Name:   JESICA GOHEEN Date of Exam: 05/15/2021 Medical Rec #:  [id number]    Height:       63.0 in Accession #:    [id number]   Weight:       99.6 lb Date of Birth:  [birth year]     BSA:          1.438 m Patient Age:    76 years     BP:           150/71 mmHg Patient Gender: F            HR:           86 bpm. Exam Location:  [facility] Procedure: 2D Echo, Cardiac Doppler and Color Doppler Indications:    CHF  History:        Patient has prior history of Echocardiogram examinations, most                 recent 09/13/2017. Mitral regurgitation, Arrythmias:Atrial                 Fibrillation, Signs/Symptoms:Altered Mental Status, Dyspnea and                 Pulmonary edema; Risk Factors:Hypertension and Dyslipidemia.                 Right hip fracture.  Sonographer:    Dustin Flock RDCS Referring Phys: [phone] [facility]  Sonographer Comments: No apical window and no subcostal window. Right hip fracture and very thin body habitus. IMPRESSIONS  1. Left ventricular ejection fraction, by estimation, is 60 to 65%. The left ventricle has normal function. Left ventricular endocardial border not optimally defined to evaluate regional wall motion. Left ventricular diastolic parameters are  indeterminate.  2. Right ventricular systolic function was not well visualized. The right ventricular size is not well visualized. There is normal pulmonary artery systolic pressure.  3. The mitral valve was not well visualized. Mild mitral valve regurgitation. No evidence of mitral stenosis.  4. The aortic valve is tricuspid. Aortic valve regurgitation is not visualized. No aortic stenosis is present.  5. Limited views obtaine due to limitations on patient positioning. FINDINGS  Left Ventricle: Left ventricular ejection fraction, by estimation, is 60 to 65%. The left ventricle has normal function. Left ventricular endocardial border not optimally defined to evaluate regional wall motion. The left ventricular internal cavity size was normal in size. There is no left ventricular hypertrophy. Left ventricular diastolic parameters are indeterminate. Right Ventricle: The right ventricular size is not well visualized. Right vetricular wall thickness was not well visualized. Right ventricular systolic function was not well visualized. There is normal pulmonary artery systolic pressure. The tricuspid regurgitant velocity is 1.82 m/s, and with an assumed right atrial pressure of 3 mmHg, the estimated right ventricular systolic pressure is 29.9 mmHg. Left Atrium: Left atrial size was not well visualized. Right Atrium: Right atrial size was not well visualized. Pericardium: There is no evidence of pericardial effusion. Mitral Valve: The mitral valve was not  well visualized. Mild mitral valve regurgitation. No evidence of mitral valve stenosis. Tricuspid Valve: The tricuspid valve is not well visualized. Tricuspid valve regurgitation is mild . No evidence of tricuspid stenosis. Aortic Valve: The aortic valve is tricuspid. Aortic valve regurgitation is not visualized. No aortic stenosis is present. Pulmonic Valve: The pulmonic valve was not well visualized. Pulmonic valve regurgitation is not visualized. No evidence of pulmonic  stenosis. Aorta: The aortic root is normal in size and structure. Pulmonary Artery: Indeterminant PASP, inadequate TR jet. IAS/Shunts: The interatrial septum was not well visualized.  LEFT VENTRICLE PLAX 2D LVIDd:         3.23 cm LVIDs:         2.14 cm LV PW:         0.89 cm LV IVS:        0.89 cm LVOT diam:     1.90 cm LVOT Area:     2.84 cm  LEFT ATRIUM         Index LA diam:    3.80 cm 2.64 cm/m   AORTA Ao Root diam: 2.30 cm TRICUSPID VALVE TR Peak grad:   13.2 mmHg TR Vmax:        182.00 cm/s  SHUNTS Systemic Diam: 1.90 cm Carlyle Dolly MD Electronically signed by Carlyle Dolly MD Signature Date/Time: 05/15/2021/4:09:43 PM    Final    DG HIP OPERATIVE UNILAT WITH PELVIS RIGHT  Result Date: 05/17/2021 CLINICAL DATA:  Pinning of RIGHT hip EXAM: OPERATIVE RIGHT HIP (WITH PELVIS IF PERFORMED) 9 VIEWS TECHNIQUE: Fluoroscopic spot image(s) were submitted for interpretation post-operatively. COMPARISON:  05/14/2021 FLUOROSCOPY TIME:  1 minutes 13 seconds FINDINGS: Osseous demineralization. Images demonstrate placement of 3 cannulated screws across an impacted subcapital fracture of the RIGHT femoral neck. No dislocation. IMPRESSION: Post pinning of RIGHT femoral neck fracture. Electronically Signed   By: Lavonia Dana M.D.   On: 05/17/2021 15:30   DG Hip Unilat With Pelvis 2-3 Views Right  Result Date: 05/14/2021 CLINICAL DATA:  Fall, right hip pain EXAM: DG HIP (WITH OR WITHOUT PELVIS) 2-3V RIGHT COMPARISON:  07/18/2012 FINDINGS: There is a right femoral neck fracture noted. No subluxation or dislocation. Slight impaction. Early degenerative changes in the hips bilaterally. IMPRESSION: Right femoral neck fracture. Electronically Signed   By: Rolm Baptise M.D.   On: 05/14/2021 18:16     CBC Recent Labs  Lab 05/14/21 1721 05/14/21 2259 05/15/21 0440 05/16/21 0551 05/17/21 0623  WBC 9.7 18.8* 18.7* 12.1* 13.0*  HGB 11.5* 12.2 12.3 11.4* 12.0  HCT 35.3* 37.9 37.3 34.9* 36.9  PLT 865* 851* 847* 744*  866*  MCV 102.3* 101.3* 99.7 100.3* 102.2*  MCH 33.3 32.6 32.9 32.8 33.2  MCHC 32.6 32.2 33.0 32.7 32.5  RDW 17.1* 16.9* 16.7* 16.9* 16.8*  LYMPHSABS 1.6 2.0  --   --   --   MONOABS 0.8 1.2*  --   --   --   EOSABS 0.4 0.2  --   --   --   BASOSABS 0.0 0.1  --   --   --     Chemistries  Recent Labs  Lab 05/14/21 1721 05/14/21 2259 05/15/21 0440 05/16/21 0551 05/17/21 0623  NA 136 136 137 140 140  K 4.9 4.3 4.5 4.0 3.9  CL 101 102 101 102 100  CO2 28 23 25 26 27   GLUCOSE 92 164* 155* 117* 120*  BUN 43* 40* 39* 47* 53*  CREATININE 2.07* 1.78* 1.69* 1.63* 1.57*  CALCIUM  8.9 8.6* 9.0 9.0 9.4   ------------------------------------------------------------------------------------------------------------------ No results for input(s): CHOL, HDL, LDLCALC, TRIG, CHOLHDL, LDLDIRECT in the last 72 hours.  Lab Results  Component Value Date   HGBA1C 5.7 (H) 09/13/2017   ------------------------------------------------------------------------------------------------------------------ Recent Labs    05/15/21 2326  TSH 0.935   ------------------------------------------------------------------------------------------------------------------ No results for input(s): VITAMINB12, FOLATE, FERRITIN, TIBC, IRON, RETICCTPCT in the last 72 hours.  Coagulation profile Recent Labs  Lab 05/14/21 1721 05/15/21 0440 05/16/21 0551 05/17/21 0623 05/17/21 1200  INR 2.7* 2.3* 1.6* 1.6* 1.4*    No results for input(s): DDIMER in the last 72 hours.  Cardiac Enzymes No results for input(s): CKMB, TROPONINI, MYOGLOBIN in the last 168 hours.  Invalid input(s): CK ------------------------------------------------------------------------------------------------------------------    Component Value Date/Time   BNP 238.0 (H) 09/12/2017 8295     Roxan Hockey M.D on 05/17/2021 at 5:30 PM  Go to [email or web address] - for contact info  [facility] - Office  [phone]

## 2021-05-17 NOTE — Progress Notes (Signed)
SLP Cancellation Note  Patient Details Name: Leslie Rosario MRN: [id number] DOB: [birth year]   Cancelled treatment:       Reason Eval/Treat Not Completed: Medical issues which prohibited therapy. Pt NPO pending procedure scheduled for 1pm this afternoon therefore PO trials not provided. SLP will check back later today after procedure for diet check as schedule permits and as Pt is appropriate for trials (alert & responsive etc.). In the case that she is not seen and/or not appropriate today, SLP can provide diet check on 6/4 as needed and appropriate. Will be in touch with LPN/RN, thank you  Shaila Gilchrest H. Roddie Mc, CCC-SLP Speech Language Pathologist   Leslie Rosario 05/17/2021, 9:37 AM

## 2021-05-17 NOTE — Brief Op Note (Addendum)
  OPERATIVE REPORT  05/17/2021  2:26 PM  PATIENT:  Leslie Rosario  87 y.o. female  PRE-OPERATIVE DIAGNOSIS:  right femoral neck fracture  POST-OPERATIVE DIAGNOSIS:  right femoral neck fracture  PROCEDURE:  Procedure(s): CANNULATED HIP PINNING (Right)   Fully threaded 6.5 mm screws with washers titanium/  SURGEON:  Surgeon(s) and Role:    Carole Civil, MD - Primary   Surgeon Aline Brochure   Anesthesia spinal  Findings at surgery non displaced right  femoral neck fracture   Details of procedure  The patient was identified in the preoperative area, the chart was reviewed. The x-rays were reviewed. The surgical site was confirmed and marked.  The patient was then taken to the operating room where she was given a spinal anesthetic and placed on the fracture table. The injured leg was placed in a traction device and the well leg was abducted flexed and externally rotated at the hip placed in a padded leg holder.  Preliminary timeout to confirm x-ray gown and badges was performed and the C-arm was brought in and radiographs were taken in multiple planes confirming the fracture to be reduced and adequate radiographs could be obtained.  The leg was then prepped and draped appropriately with ChloraPrep   Timeout was completed surgical site was confirmed. Implants had been checked prior to bringing the patient back to the room.  The incision was made starting just below the greater trochanter extended distally; subcutaneous tissue was divided in line with the skin incision. The vastus lateralis fascia was split and any bleeding vessels were coagulated.  Subperiosteal dissection continued until the proximal femur was exposed. A multi hole pin guide was placed on the bone and to threaded guidewires were placed in the superior portion of the femoral head and neck. These were adjusted until there were in the appropriate position. A third pin was placed to form an inverted triangle  configuration at the base of the femoral neck.  Each pin was independently measured followed by drilling of the near cortex and insertion of appropriate screws with washers.  Each screw was checked with AP and lateral x-rays using oblique x-rays were needed.  The wound was irrigated with copious amounts of saline and then closure was performed  Closure was performed with 0 Monocryl suture in 3 layers. Marcaine with epinephrine was injected in the subfascial layers time 60 mL.  The skin was reapproximated with staples  The postoperative plan is for immediate weightbearing as tolerated  Radiographs at 2, 6 and 12 weeks.  DVT prophylaxis will be provided as well for one month.  Post op appt in 1 month   subq stitches   CPT code [phone]

## 2021-05-18 LAB — BASIC METABOLIC PANEL
Anion gap: 9 (ref 5–15)
BUN: 48 mg/dL — ABNORMAL HIGH (ref 8–23)
CO2: 27 mmol/L (ref 22–32)
Calcium: 8.3 mg/dL — ABNORMAL LOW (ref 8.9–10.3)
Chloride: 106 mmol/L (ref 98–111)
Creatinine, Ser: 1.34 mg/dL — ABNORMAL HIGH (ref 0.44–1.00)
GFR, Estimated: 36 mL/min — ABNORMAL LOW (ref >60)
Glucose, Bld: 102 mg/dL — ABNORMAL HIGH (ref 70–99)
Potassium: 3.3 mmol/L — ABNORMAL LOW (ref 3.5–5.1)
Sodium: 142 mmol/L (ref 135–145)

## 2021-05-18 LAB — CBC
HCT: 34.3 % — ABNORMAL LOW (ref 36.0–46.0)
Hemoglobin: 10.9 g/dL — ABNORMAL LOW (ref 12.0–15.0)
MCH: 32.8 pg (ref 26.0–34.0)
MCHC: 31.8 g/dL (ref 30.0–36.0)
MCV: 103.3 fL — ABNORMAL HIGH (ref 80.0–100.0)
Platelets: 727 10*3/uL — ABNORMAL HIGH (ref 150–400)
RBC: 3.32 MIL/uL — ABNORMAL LOW (ref 3.87–5.11)
RDW: 16.4 % — ABNORMAL HIGH (ref 11.5–15.5)
WBC: 11.3 10*3/uL — ABNORMAL HIGH (ref 4.0–10.5)
nRBC: 0 % (ref 0.0–0.2)

## 2021-05-18 LAB — PROTIME-INR
INR: 1.2 (ref 0.8–1.2)
Prothrombin Time: 15.4 seconds — ABNORMAL HIGH (ref 11.4–15.2)

## 2021-05-18 LAB — GLUCOSE, CAPILLARY
Glucose-Capillary: 102 mg/dL — ABNORMAL HIGH (ref 70–99)
Glucose-Capillary: 107 mg/dL — ABNORMAL HIGH (ref 70–99)

## 2021-05-18 MED ORDER — POTASSIUM CHLORIDE 10 MEQ/100ML IV SOLN
10.0000 meq | INTRAVENOUS | Status: AC
  Administered 2021-05-18 (×4): 10 meq via INTRAVENOUS
  Filled 2021-05-18 (×4): qty 100

## 2021-05-18 MED ORDER — WARFARIN - PHARMACIST DOSING INPATIENT: Freq: Every day | Status: DC

## 2021-05-18 MED ORDER — WARFARIN SODIUM 5 MG PO TABS
6.0000 mg | ORAL_TABLET | Freq: Once | ORAL | Status: AC
  Administered 2021-05-18: 6 mg via ORAL
  Filled 2021-05-18: qty 1

## 2021-05-18 NOTE — Evaluation (Signed)
Physical Therapy Evaluation Patient Details Name: Leslie Rosario MRN: [id number] DOB: 1923-10-30 Today's Date: 05/18/2021   History of Present Illness  87 y.o. female with medical history significant for atrial fibrillation on coumadin, mitral regurgitation, HTN admitted on 05/14/2021 with Right Hip Fx after a mechanical fall, subsequently after 10 PM on 05/14/2020 developed unresponsive episode presumably after getting fentanyl in the ED previously, was found to be in flash pulmonary edema responded well to IV Lasix. Dr. Aline Brochure plan is for ORIF on 05/17/21 after further medical stabilization.  S/p R hip pinning, WBAT.  Clinical Impression  Patient present in room with son and agreeable to PT eval. Son collaborated in subjective. Presents with decreased LE strength and coordination, increased difficulty moving B ankles, preference to wiggle toes, and unable to complete quad sets B. Able to assist in moving LLE to EOB with PT moving RLE, and good transfer to EOB with ModA-MaxA. Great balance EOB with HOB elevated and B UE support. Attempt sit to stand transfer, with MaxA and unable to remain standing. RN notified of mobility status. Patient will benefit from continued physical therapy in hospital and recommended venue below to increase strength, balance, endurance for safe ADLs and gait.     Follow Up Recommendations SNF    Equipment Recommendations  None recommended by PT    Recommendations for Other Services       Precautions / Restrictions Precautions Precautions: Fall Restrictions Weight Bearing Restrictions: Yes RLE Weight Bearing: Weight bearing as tolerated      Mobility  Bed Mobility Overal bed mobility: Needs Assistance Bed Mobility: Supine to Sit;Sit to Supine     Supine to sit: Mod assist Sit to supine: Mod assist   General bed mobility comments: slow and labored, decreased active motion of LEs, R worse than L.    Transfers Overall transfer level: Needs  assistance Equipment used: Rolling walker (2 wheeled)             General transfer comment: sit<>Stand MaxA.  Ambulation/Gait                Stairs            Wheelchair Mobility    Modified Rankin (Stroke Patients Only)       Balance Overall balance assessment: Needs assistance Sitting-balance support: Bilateral upper extremity supported;Feet unsupported Sitting balance-Leahy Scale: Fair Sitting balance - Comments: EOB, good balance without external support but increased kyphosis.                                     Pertinent Vitals/Pain Pain Assessment: No/denies pain    Home Living Family/patient expects to be discharged to:: Assisted living               Home Equipment: Walker - 2 wheels;Wheelchair - manual Additional Comments: son present in room and acted as Environmental consultant in history, he reports she is in a assisted living or nursing home. He reports that she requires assistance with most all ADLs. He reports that she mostly is in a wheelchair, but does walk some with a RW.    Prior Function Level of Independence: Needs assistance   Gait / Transfers Assistance Needed: son reports she mostly uses a wheelchair but sometimes walks.  ADL's / Homemaking Assistance Needed: assistance        Hand Dominance        Extremity/Trunk Assessment   Upper Extremity  Assessment Upper Extremity Assessment: Generalized weakness    Lower Extremity Assessment Lower Extremity Assessment: Generalized weakness    Cervical / Trunk Assessment Cervical / Trunk Assessment: Kyphotic  Communication   Communication: HOH  Cognition Arousal/Alertness: Awake/alert Behavior During Therapy: Flat affect Overall Cognitive Status: Within Functional Limits for tasks assessed                                 General Comments: did ask why her hip hurt, when PT stated she had surgery, she was surprised.      General Comments       Exercises General Exercises - Lower Extremity Ankle Circles/Pumps: AROM;Strengthening;Both;15 reps Quad Sets: AROM;Strengthening;Both;5 reps   Assessment/Plan    PT Assessment Patient needs continued PT services  PT Problem List Decreased strength;Decreased range of motion;Decreased knowledge of use of DME;Decreased activity tolerance;Decreased safety awareness;Decreased balance;Pain;Decreased mobility       PT Treatment Interventions DME instruction;Balance training;Gait training;Neuromuscular re-education;Stair training;Functional mobility training;Patient/family education;Therapeutic activities;Therapeutic exercise    PT Goals (Current goals can be found in the Care Plan section)  Acute Rehab PT Goals Patient Stated Goal: return to nursing home PT Goal Formulation: With patient/family Time For Goal Achievement: 06/01/21 Potential to Achieve Goals: Good    Frequency Min 3X/week   Barriers to discharge        Co-evaluation               AM-PAC PT "6 Clicks" Mobility  Outcome Measure Help needed turning from your back to your side while in a flat bed without using bedrails?: A Lot Help needed moving from lying on your back to sitting on the side of a flat bed without using bedrails?: A Lot Help needed moving to and from a bed to a chair (including a wheelchair)?: A Lot Help needed standing up from a chair using your arms (e.g., wheelchair or bedside chair)?: Total Help needed to walk in hospital room?: Total Help needed climbing 3-5 steps with a railing? : Total 6 Click Score: 9    End of Session   Activity Tolerance: Patient tolerated treatment well;Patient limited by fatigue Patient left: in bed;with call bell/phone within reach;with family/visitor present;with SCD's reapplied Nurse Communication: Mobility status PT Visit Diagnosis: Unsteadiness on feet (R26.81);Other abnormalities of gait and mobility (R26.89);Repeated falls (R29.6);Muscle weakness (generalized)  (M62.81)    Time: 8676-7209 PT Time Calculation (min) (ACUTE ONLY): 23 min   Charges:   PT Evaluation $PT Eval Low Complexity: 1 Low PT Treatments $Therapeutic Activity: 8-22 mins        12:40 PM,05/18/21 Domenic Moras, PT, DPT Physical Therapist at [facility]

## 2021-05-18 NOTE — Progress Notes (Signed)
  Speech Language Pathology Treatment: Dysphagia  Patient Details Name: Leslie Rosario MRN: [id number] DOB: 1923-05-15 Today's Date: 05/18/2021 Time: 1007-1219 SLP Time Calculation (min) (ACUTE ONLY): 20.53 min  Assessment / Plan / Recommendation Clinical Impression  Ongoing diagnostic dysphagia treatment provided this am post procedure completed 05/17/21; Pt continues to demonstrate overt s/sx of aspiration with thin liquids and NLT including immediate coughing, delayed throat clearing, and wet vocal quality. With trials of HTL note only inconsistent wet vocal quality that was reflexively cleared. Pt consumed soft and puree textures with prolonged oral prep and suspected delayed swallow but no coughing or wet vocal quality noted. Recommend initiate D2/fine chop diet and HONEY thick liquids; recommend crush meds in puree. Pt was requesting water and SLP educated Pt and LPN that after oral care per free water she is permitted single ice chips. ST will continue to follow acutely for subjective upgrade or need for instrumental testing, thank you   HPI HPI: 87 y.o. female with medical history significant for atrial fibrillation on coumadin, mitral regurgitation, HTN admitted on 05/14/2021 with Right Hip Fx after a mechanical fall, subsequently after 10 PM on 05/14/2020 developed unresponsive episode presumably after getting fentanyl in the ED previously, was found to be in flash pulmonary edema responded well to IV Lasix. Dr. Aline Rosario plan is for ORIF on 05/17/21 after further medical stabilization. RN reports "choking" episode this am with water. BSE requested      SLP Plan  Continue with current plan of care       Recommendations  Diet recommendations: Dysphagia 2 (fine chop);Honey-thick liquid Liquids provided via: Teaspoon;No straw Medication Administration: Whole meds with puree Supervision: Staff to assist with self feeding;Full supervision/cueing for compensatory strategies Compensations: Slow  rate;Small sips/bites Postural Changes and/or Swallow Maneuvers: Seated upright 90 degrees;Upright 30-60 min after meal                Oral Care Recommendations: Oral care BID Follow up Recommendations: 24 hour supervision/assistance SLP Visit Diagnosis: Dysphagia, unspecified (R13.10) Plan: Continue with current plan of care       Leslie Rosario H. Roddie Mc, [place] Speech Language Pathologist    Leslie Rosario 05/18/2021, 10:27 AM

## 2021-05-18 NOTE — Plan of Care (Signed)
  Problem: Acute Rehab PT Goals(only PT should resolve) Goal: Pt Will Go Supine/Side To Sit Outcome: Progressing Flowsheets (Taken 05/18/2021 1241) Pt will go Supine/Side to Sit: with min guard assist Goal: Patient Will Transfer Sit To/From Stand Outcome: Progressing Flowsheets (Taken 05/18/2021 1241) Patient will transfer sit to/from stand: with moderate assist Goal: Pt Will Transfer Bed To Chair/Chair To Bed Outcome: Progressing Flowsheets (Taken 05/18/2021 1241) Pt will Transfer Bed to Chair/Chair to Bed: with mod assist Goal: Pt Will Ambulate Outcome: Progressing Flowsheets (Taken 05/18/2021 1241) Pt will Ambulate:  10 feet  with least restrictive assistive device  with moderate assist  12:41 PM,05/18/21 Domenic Moras, PT, DPT Physical Therapist at [facility]

## 2021-05-18 NOTE — Progress Notes (Signed)
Patient Demographics:    Leslie Rosario, is a 87 y.o. female, DOB - 1923/09/30, WKM:[id number]  Admit date - 05/14/2021   Admitting Physician Ejiroghene Arlyce Dice, MD  Outpatient Primary MD for the patient is Celene Squibb, MD  LOS - 4   Chief Complaint  Patient presents with  . Fall        Subjective:    Leslie Rosario today has no fevers, no emesis,  No chest pain,   Son steven at bedside  Assessment  & Plan :    Principal Problem:   Closed right hip fracture [facility]) Active Problems:   Atrial fibrillation ([place])   Long term current use of anticoagulant therapy   Acute metabolic encephalopathy   Dysphagia   Hypothyroidism   Essential hypertension   AMS (altered mental status)  Brief Summary:- 87 y.o. female with medical history significant for atrial fibrillation on coumadin, mitral regurgitation, HTN admitted on 05/14/2021 with Right Hip Fx after a mechanical fall, subsequently after 10 PM on 05/14/2020 developed unresponsive episode presumably after getting fentanyl in the ED previously, was found to be in flash pulmonary edema responded well to IV Lasix   A/p 1) acute metabolic encephalopathy--- unresponsive episode after 10 PM on 05/14/2021--- more awake, more alert, more interactive CT head and CT C-spine without acute findings-- -Suspect this was related to IV fentanyl and transient hemodynamic compromise on the day of admission -Now resolved -According to patient's son who is at bedside patient appears back to baseline  2)HFpEF--- patient appears to have chronic diastolic dysfunction CHF,  --echo from 09/13/2017 with EF of 60 to 65%, on 05/14/2021 patient had "flash pulmonary edema" after 10 PM after receiving IV fentanyl previously in the ED -- please see chest x-ray report, she responded well to IV Lasix -Continue Lasix -Updated echo requested -Repeat chest x-ray on 05/16/2021 shows resolving  edema -Monitor fluid intake and output and daily weight carefully especially given underlying CKD 4  3) chronic atrial fibrillation--resume Coumadin on 05/18/2021  -- Continue metoprolol 25 mg p.o. twice daily for rate control  4) chronic anticoagulation--- in the setting of chronic A. fib, s/p right hip surgery,  -Coumadin therapy as above #3  5)AKI----acute kidney injury on CKD stage -iV   creatinine on admission= 2.07  , baseline creatinine =  1.7 to 1.8   ,  -creatinine is now=  1.3 (renal function appears to be back to baseline) - renally adjust medications, avoid nephrotoxic agents / dehydration  / hypotension --Monitor fluid intake and output and daily weight carefully especially given underlying CKD 4  6)Leukocytosis-- ??  Reactive secondary to hip fracture and flash pulmonary edema -No fevers or other evidence of acute infection at this time WBC 9.7>> 18.8 >> 18.7>> 12.1 Urine and blood cultures- NGTD --no antibiotics indicated at this time  7)Dysphagia/Swallowing concerns--- speech eval appreciated, recommends Dysphagia 2 (fine chop);Honey-thick liquid ---aspiration precautions  8)Right Hip Fracture--- discussed with orthopedic surgeon Dr. Arther Abbott  S/p ORIF on 05/17/2021 (CANNULATED HIP PINNING (Right) - -The postoperative plan is for immediate weightbearing as tolerated Radiographs at 2, 6 and 12 weeks. DVT prophylaxis (patient already on Coumadin for A. Fib) Post op appt in 1 month , subq stitches  -May continue aspirin  for another couple days as INR is not therapeutic yet   9)Hypothyroidism--- continue levothyroxine 88 mcg daily,  TSH 0.935  10)Thrombocytosis --platelets appears to be close to her baseline,  Follows with Dr. Delton Coombes -Continue hydroxyurea.  11) social/ethics--patient is a DNR/DNI plan of care discussed with patient's son [name] and steve at bedside  Disposition/Need for in-Hospital Stay- patient unable to be discharged at this time due to  --awaiting transfer to SNF rehab  Status is: Inpatient  Remains inpatient appropriate because:Please see disposition above   Disposition: The patient is from: [facility]              Anticipated d/c is to: SNF              Anticipated d/c date is: 2 days              Patient currently is medically stable to d/c. Barriers: Awaiting transfer to SNF rehab Code Status :  -  Code Status: DNR   Family Communication:   Discussed with son [name] and Richardson Landry  Consults  :  Ortho/speech  DVT Prophylaxis  :   - SCDs   SCDs Start: 05/17/21 1543 SCDs Start: 05/14/21 2032   Lab Results  Component Value Date   PLT 727 (H) 05/18/2021    Inpatient Medications  Scheduled Meds: . acetaminophen  650 mg Rectal QID  . aspirin  300 mg Rectal Daily  . docusate sodium  100 mg Oral BID  . Warfarin - Pharmacist Dosing Inpatient   Does not apply q1600   Continuous Infusions: . sodium chloride 75 mL/hr at 05/18/21 1700  . lactated ringers Stopped (05/17/21 1604)  . methocarbamol (ROBAXIN) IV     PRN Meds:.bisacodyl, magnesium citrate, menthol-cetylpyridinium **OR** phenol, methocarbamol **OR** methocarbamol (ROBAXIN) IV, metoCLOPramide **OR** metoCLOPramide (REGLAN) injection, morphine injection, ondansetron **OR** ondansetron (ZOFRAN) IV, traMADol    Anti-infectives (From admission, onward)   Start     Dose/Rate Route Frequency Ordered Stop   05/17/21 1930  ceFAZolin (ANCEF) IVPB 2g/100 mL premix        2 g 200 mL/hr over 30 Minutes Intravenous Every 6 hours 05/17/21 1542 05/18/21 0233   05/17/21 0600  ceFAZolin (ANCEF) IVPB 2g/100 mL premix        2 g 200 mL/hr over 30 Minutes Intravenous On call to O.R. 05/16/21 1259 05/17/21 1322   05/15/21 0600  ceFAZolin (ANCEF) IVPB 2g/100 mL premix        2 g 200 mL/hr over 30 Minutes Intravenous On call to O.R. 05/14/21 2315 05/16/21 0559        Objective:   Vitals:   05/17/21 1544 05/17/21 2048 05/17/21 2102 05/18/21 1442  BP: (!) 159/81   (!) 155/77 (!) 131/57  Pulse: 86 85 82 75  Resp: 20 16 17    Temp: 97.8 F (36.6 C)  98.4 F (36.9 C) 97.8 F (36.6 C)  TempSrc: Axillary  Oral Oral  SpO2: 92% 91% 91% 96%    Wt Readings from Last 3 Encounters:  04/17/21 45.2 kg  02/20/21 46.8 kg  01/22/21 46.6 kg     Intake/Output Summary (Last 24 hours) at 05/18/2021 1816 Last data filed at 05/18/2021 1700 Gross per 24 hour  Intake 2654.26 ml  Output --  Net 2654.26 ml     Physical Exam  Gen:- Awake Alert,  In no apparent distress  HEENT:- [place].AT, No sclera icterus Neck-Supple Neck,No JVD,.  Lungs-improved air movement, no rales, no wheezing CV- S1, S2 normal, irregularly  irregular  abd-  +ve B.Sounds, Abd Soft, No tenderness,    Extremity/Skin:- No  edema, pedal pulses present  Psych -underlying/baseline cognitive and memory deficits  neuro-generalized weakness without additional new focal deficits, no tremors MSK-postop wound intact,, point tenderness on palpation over Rt hip area   Data Review:   Micro Results Recent Results (from the past 240 hour(s))  Resp Panel by RT-PCR (Flu A&B, Covid) Nasopharyngeal Swab     Status: None   Collection Time: 05/14/21  6:24 PM   Specimen: Nasopharyngeal Swab; Nasopharyngeal(NP) swabs in vial transport medium  Result Value Ref Range Status   SARS Coronavirus 2 by RT PCR NEGATIVE NEGATIVE Final    Comment: (NOTE) SARS-CoV-2 target nucleic acids are NOT DETECTED.  The SARS-CoV-2 RNA is generally detectable in upper respiratory specimens during the acute phase of infection. The lowest concentration of SARS-CoV-2 viral copies this assay can detect is 138 copies/mL. A negative result does not preclude SARS-Cov-2 infection and should not be used as the sole basis for treatment or other patient management decisions. A negative result may occur with  improper specimen collection/handling, submission of specimen other than nasopharyngeal swab, presence of viral mutation(s) within  the areas targeted by this assay, and inadequate number of viral copies(<138 copies/mL). A negative result must be combined with clinical observations, patient history, and epidemiological information. The expected result is Negative.  Fact Sheet for Patients:  EntrepreneurPulse.com.au  Fact Sheet for Healthcare Providers:  IncredibleEmployment.be  This test is no t yet approved or cleared by the [place] FDA and  has been authorized for detection and/or diagnosis of SARS-CoV-2 by FDA under an Emergency Use Authorization (EUA). This EUA will remain  in effect (meaning this test can be used) for the duration of the COVID-19 declaration under Section 564(b)(1) of the Act, 21 U.S.C.section 360bbb-3(b)(1), unless the authorization is terminated  or revoked sooner.       Influenza A by PCR NEGATIVE NEGATIVE Final   Influenza B by PCR NEGATIVE NEGATIVE Final    Comment: (NOTE) The Xpert Xpress SARS-CoV-2/FLU/RSV plus assay is intended as an aid in the diagnosis of influenza from Nasopharyngeal swab specimens and should not be used as a sole basis for treatment. Nasal washings and aspirates are unacceptable for Xpert Xpress SARS-CoV-2/FLU/RSV testing.  Fact Sheet for Patients: EntrepreneurPulse.com.au  Fact Sheet for Healthcare Providers: IncredibleEmployment.be  This test is not yet approved or cleared by the [place] FDA and has been authorized for detection and/or diagnosis of SARS-CoV-2 by FDA under an Emergency Use Authorization (EUA). This EUA will remain in effect (meaning this test can be used) for the duration of the COVID-19 declaration under Section 564(b)(1) of the Act, 21 U.S.C. section 360bbb-3(b)(1), unless the authorization is terminated or revoked.  Performed at [facility], [address], [place], [place] [address]   MRSA PCR Screening     Status: None   Collection Time:  05/14/21 10:58 PM   Specimen: Nasal Mucosa; Nasopharyngeal  Result Value Ref Range Status   MRSA by PCR NEGATIVE NEGATIVE Final    Comment:        The GeneXpert MRSA Assay (FDA approved for NASAL specimens only), is one component of a comprehensive MRSA colonization surveillance program. It is not intended to diagnose MRSA infection nor to guide or monitor treatment for MRSA infections. Performed at [facility], [address], [place], [place] [address]   Urine Culture     Status: Abnormal   Collection Time: 05/15/21  6:01 PM   Specimen: Urine, Catheterized  Result Value Ref Range Status   Specimen Description   Final    URINE, CATHETERIZED Performed at [facility], [address], [place], [place] [address]    Special Requests   Final    Normal Performed at [facility], [address], [place], [place] [address]    Culture MULTIPLE SPECIES PRESENT, SUGGEST RECOLLECTION (A)  Final   Report Status 05/17/2021 FINAL  Final  Culture, blood (Routine X 2) w Reflex to ID Panel     Status: None (Preliminary result)   Collection Time: 05/15/21 11:26 PM   Specimen: BLOOD RIGHT FOREARM  Result Value Ref Range Status   Specimen Description BLOOD RIGHT FOREARM  Final   Special Requests   Final    BOTTLES DRAWN AEROBIC AND ANAEROBIC Blood Culture adequate volume   Culture   Final    NO GROWTH 3 DAYS Performed at [facility], [address], [place], [place] [address]    Report Status PENDING  Incomplete  Culture, blood (Routine X 2) w Reflex to ID Panel     Status: None (Preliminary result)   Collection Time: 05/15/21 11:29 PM   Specimen: BLOOD  Result Value Ref Range Status   Specimen Description BLOOD RIGHT ANTECUBITAL  Final   Special Requests   Final    BOTTLES DRAWN AEROBIC AND ANAEROBIC Blood Culture adequate volume   Culture   Final    NO GROWTH 3 DAYS Performed at [facility], [address], [place], [place] [address]    Report Status PENDING  Incomplete  Surgical  PCR screen     Status: None   Collection Time: 05/17/21  5:51 AM   Specimen: Nasal Mucosa; Nasal Swab  Result Value Ref Range Status   MRSA, PCR NEGATIVE NEGATIVE Final   Staphylococcus aureus NEGATIVE NEGATIVE Final    Comment: (NOTE) The Xpert SA Assay (FDA approved for NASAL specimens in patients 55 years of age and older), is one component of a comprehensive surveillance program. It is not intended to diagnose infection nor to guide or monitor treatment. Performed at [facility], [address], [place], [place] [address]     Radiology Reports DG Chest 1 View  Result Date: 05/14/2021 CLINICAL DATA:  Fall, hip pain EXAM: CHEST  1 VIEW COMPARISON:  11/12/2018 FINDINGS: Heart is borderline in size. Peribronchial thickening, vascular congestion and interstitial prominence. This could reflect interstitial edema. No confluent opacities or effusions. IMPRESSION: Bronchitic changes. Suspect mild vascular congestion and interstitial edema. Electronically Signed   By: Rolm Baptise M.D.   On: 05/14/2021 18:19   CT HEAD WO CONTRAST  Result Date: 05/14/2021 CLINICAL DATA:  87 year old female with encephalopathy. EXAM: CT HEAD WITHOUT CONTRAST TECHNIQUE: Contiguous axial images were obtained from the base of the skull through the vertex without intravenous contrast. COMPARISON:  Head CT dated 05/14/2021. FINDINGS: Brain: Moderate age-related atrophy and chronic microvascular ischemic changes. There is no acute intracranial hemorrhage. No mass effect or midline shift. No extra-axial fluid collection. Vascular: No hyperdense vessel or unexpected calcification. Skull: Normal. Negative for fracture or focal lesion. Sinuses/Orbits: Right maxillary sinus retention cyst or polyp. The remainder of the visualized paranasal sinuses and mastoid air cells are clear. Other: None IMPRESSION: 1. No acute intracranial pathology. 2. Moderate age-related atrophy and chronic microvascular ischemic changes. Electronically  Signed   By: Anner Crete M.D.   On: 05/14/2021 23:01   CT Head Wo Contrast  Result Date: 05/14/2021 CLINICAL DATA:  Status post fall out of bed.  Facial laceration. EXAM: CT HEAD WITHOUT CONTRAST CT CERVICAL SPINE WITHOUT CONTRAST TECHNIQUE: Multidetector CT imaging of the head and cervical spine was performed following the standard protocol without intravenous contrast. Multiplanar CT image reconstructions of the cervical spine were also generated. COMPARISON:  Head CT 09/12/2017.  CT neck 10/10/2016. FINDINGS: CT HEAD FINDINGS Brain: No evidence of acute infarction, hemorrhage, hydrocephalus, extra-axial collection or mass lesion/mass effect. Vascular: No hyperdense vessel or unexpected calcification. Skull: Intact.  No focal lesion. Sinuses/Orbits: Wall and mucosal thickening in the right maxillary sinus consistent with chronic sinus disease appear unchanged. Mild ethmoid air cell disease on left noted. Other: None. CT CERVICAL SPINE FINDINGS Alignment: Normal. Skull base and vertebrae: No acute fracture. No primary bone lesion or focal pathologic process. Soft tissues and spinal canal: No prevertebral fluid or swelling. No visible canal hematoma. Disc levels: Mild multilevel loss of disc space height and facet arthropathy noted. Upper chest: There is interlobular septal thickening and ground-glass attenuation in the lung apices. Other: None. IMPRESSION: No acute abnormality head or cervical spine. Appearance of the lung apices suggestive of interstitial edema. Electronically Signed   By: Inge Rise M.D.   On: 05/14/2021 17:54   CT Cervical Spine Wo Contrast  Result Date: 05/14/2021 CLINICAL DATA:  Status post fall out of bed.  Facial laceration. EXAM: CT HEAD WITHOUT CONTRAST CT CERVICAL SPINE WITHOUT CONTRAST TECHNIQUE: Multidetector CT imaging of the head and cervical spine was performed following the standard protocol without intravenous contrast. Multiplanar CT image reconstructions of the  cervical spine were also generated. COMPARISON:  Head CT 09/12/2017.  CT neck 10/10/2016. FINDINGS: CT HEAD FINDINGS Brain: No evidence of acute infarction, hemorrhage, hydrocephalus, extra-axial collection or mass lesion/mass effect. Vascular: No hyperdense vessel or unexpected calcification. Skull: Intact.  No focal lesion. Sinuses/Orbits: Wall and mucosal thickening in the right maxillary sinus consistent with chronic sinus disease appear unchanged. Mild ethmoid air cell disease on left noted. Other: None. CT CERVICAL SPINE FINDINGS Alignment: Normal. Skull base and vertebrae: No acute fracture. No primary bone lesion or focal pathologic process. Soft tissues and spinal canal: No prevertebral fluid or swelling. No visible canal hematoma. Disc levels: Mild multilevel loss of disc space height and facet arthropathy noted. Upper chest: There is interlobular septal thickening and ground-glass attenuation in the lung apices. Other: None. IMPRESSION: No acute abnormality head or cervical spine. Appearance of the lung apices suggestive of interstitial edema. Electronically Signed   By: Inge Rise M.D.   On: 05/14/2021 17:54   DG CHEST PORT 1 VIEW  Result Date: 05/16/2021 CLINICAL DATA:  87 year old female with shortness of breath. EXAM: PORTABLE CHEST 1 VIEW COMPARISON:  Portable chest 05/14/2021 and earlier. FINDINGS: Portable AP semi upright view at 0521 hours. Larger lung volumes and decreased bilateral pulmonary interstitial opacity. Lung markings appear to be near baseline now. Stable cardiac size and mediastinal contours. No definite cardiomegaly. Visualized tracheal air column is within normal limits. No pneumothorax or pleural effusion. Sequelae of thoracolumbar junction region vertebral plasty. Stable visualized osseous structures. Paucity of bowel gas in the upper abdomen. IMPRESSION: Improved lung volumes and ventilation since 05/14/2020 with resolving edema or less likely viral/atypical infection.  Electronically Signed   By: Genevie Ann M.D.   On: 05/16/2021 06:34   DG CHEST PORT 1 VIEW  Result Date: 05/14/2021 CLINICAL DATA:  Unresponsive EXAM: PORTABLE CHEST 1 VIEW COMPARISON:  05/14/2021 FINDINGS: Cardiac shadow is stable. Aortic calcifications are again seen.  Lungs are well aerated bilaterally. Increasing interstitial edema is noted. No confluent infiltrate or effusion is noted. Changes of prior vertebral augmentation are again seen. IMPRESSION: Increasing pulmonary edema. Electronically Signed   By: Inez Catalina M.D.   On: 05/14/2021 23:18   ECHOCARDIOGRAM COMPLETE  Result Date: 05/15/2021    ECHOCARDIOGRAM REPORT   Patient Name:   RODNEY WIGGER Date of Exam: 05/15/2021 Medical Rec #:  [id number]    Height:       63.0 in Accession #:    [id number]   Weight:       99.6 lb Date of Birth:  May 28, 1923     BSA:          1.438 m Patient Age:    48 years     BP:           150/71 mmHg Patient Gender: F            HR:           86 bpm. Exam Location:  [facility] Procedure: 2D Echo, Cardiac Doppler and Color Doppler Indications:    CHF  History:        Patient has prior history of Echocardiogram examinations, most                 recent 09/13/2017. Mitral regurgitation, Arrythmias:Atrial                 Fibrillation, Signs/Symptoms:Altered Mental Status, Dyspnea and                 Pulmonary edema; Risk Factors:Hypertension and Dyslipidemia.                 Right hip fracture.  Sonographer:    Dustin Flock RDCS Referring Phys: [phone] [facility]  Sonographer Comments: No apical window and no subcostal window. Right hip fracture and very thin body habitus. IMPRESSIONS  1. Left ventricular ejection fraction, by estimation, is 60 to 65%. The left ventricle has normal function. Left ventricular endocardial border not optimally defined to evaluate regional wall motion. Left ventricular diastolic parameters are indeterminate.  2. Right ventricular systolic function was not well visualized. The right ventricular  size is not well visualized. There is normal pulmonary artery systolic pressure.  3. The mitral valve was not well visualized. Mild mitral valve regurgitation. No evidence of mitral stenosis.  4. The aortic valve is tricuspid. Aortic valve regurgitation is not visualized. No aortic stenosis is present.  5. Limited views obtaine due to limitations on patient positioning. FINDINGS  Left Ventricle: Left ventricular ejection fraction, by estimation, is 60 to 65%. The left ventricle has normal function. Left ventricular endocardial border not optimally defined to evaluate regional wall motion. The left ventricular internal cavity size was normal in size. There is no left ventricular hypertrophy. Left ventricular diastolic parameters are indeterminate. Right Ventricle: The right ventricular size is not well visualized. Right vetricular wall thickness was not well visualized. Right ventricular systolic function was not well visualized. There is normal pulmonary artery systolic pressure. The tricuspid regurgitant velocity is 1.82 m/s, and with an assumed right atrial pressure of 3 mmHg, the estimated right ventricular systolic pressure is 13.0 mmHg. Left Atrium: Left atrial size was not well visualized. Right Atrium: Right atrial size was not well visualized. Pericardium: There is no evidence of pericardial effusion. Mitral Valve: The mitral valve was not well visualized. Mild mitral valve regurgitation. No evidence of mitral valve stenosis. Tricuspid Valve: The tricuspid valve is not  well visualized. Tricuspid valve regurgitation is mild . No evidence of tricuspid stenosis. Aortic Valve: The aortic valve is tricuspid. Aortic valve regurgitation is not visualized. No aortic stenosis is present. Pulmonic Valve: The pulmonic valve was not well visualized. Pulmonic valve regurgitation is not visualized. No evidence of pulmonic stenosis. Aorta: The aortic root is normal in size and structure. Pulmonary Artery: Indeterminant  PASP, inadequate TR jet. IAS/Shunts: The interatrial septum was not well visualized.  LEFT VENTRICLE PLAX 2D LVIDd:         3.23 cm LVIDs:         2.14 cm LV PW:         0.89 cm LV IVS:        0.89 cm LVOT diam:     1.90 cm LVOT Area:     2.84 cm  LEFT ATRIUM         Index LA diam:    3.80 cm 2.64 cm/m   AORTA Ao Root diam: 2.30 cm TRICUSPID VALVE TR Peak grad:   13.2 mmHg TR Vmax:        182.00 cm/s  SHUNTS Systemic Diam: 1.90 cm Carlyle Dolly MD Electronically signed by Carlyle Dolly MD Signature Date/Time: 05/15/2021/4:09:43 PM    Final    DG HIP OPERATIVE UNILAT WITH PELVIS RIGHT  Result Date: 05/17/2021 CLINICAL DATA:  Pinning of RIGHT hip EXAM: OPERATIVE RIGHT HIP (WITH PELVIS IF PERFORMED) 9 VIEWS TECHNIQUE: Fluoroscopic spot image(s) were submitted for interpretation post-operatively. COMPARISON:  05/14/2021 FLUOROSCOPY TIME:  1 minutes 13 seconds FINDINGS: Osseous demineralization. Images demonstrate placement of 3 cannulated screws across an impacted subcapital fracture of the RIGHT femoral neck. No dislocation. IMPRESSION: Post pinning of RIGHT femoral neck fracture. Electronically Signed   By: Lavonia Dana M.D.   On: 05/17/2021 15:30   DG Hip Unilat With Pelvis 2-3 Views Right  Result Date: 05/14/2021 CLINICAL DATA:  Fall, right hip pain EXAM: DG HIP (WITH OR WITHOUT PELVIS) 2-3V RIGHT COMPARISON:  07/18/2012 FINDINGS: There is a right femoral neck fracture noted. No subluxation or dislocation. Slight impaction. Early degenerative changes in the hips bilaterally. IMPRESSION: Right femoral neck fracture. Electronically Signed   By: Rolm Baptise M.D.   On: 05/14/2021 18:16     CBC Recent Labs  Lab 05/14/21 1721 05/14/21 2259 05/15/21 0440 05/16/21 0551 05/17/21 0623 05/18/21 0639  WBC 9.7 18.8* 18.7* 12.1* 13.0* 11.3*  HGB 11.5* 12.2 12.3 11.4* 12.0 10.9*  HCT 35.3* 37.9 37.3 34.9* 36.9 34.3*  PLT 865* 851* 847* 744* 866* 727*  MCV 102.3* 101.3* 99.7 100.3* 102.2* 103.3*  MCH  33.3 32.6 32.9 32.8 33.2 32.8  MCHC 32.6 32.2 33.0 32.7 32.5 31.8  RDW 17.1* 16.9* 16.7* 16.9* 16.8* 16.4*  LYMPHSABS 1.6 2.0  --   --   --   --   MONOABS 0.8 1.2*  --   --   --   --   EOSABS 0.4 0.2  --   --   --   --   BASOSABS 0.0 0.1  --   --   --   --     Chemistries  Recent Labs  Lab 05/14/21 2259 05/15/21 0440 05/16/21 0551 05/17/21 0623 05/18/21 0639  NA 136 137 140 140 142  K 4.3 4.5 4.0 3.9 3.3*  CL 102 101 102 100 106  CO2 23 25 26 27 27   GLUCOSE 164* 155* 117* 120* 102*  BUN 40* 39* 47* 53* 48*  CREATININE 1.78* 1.69* 1.63* 1.57*  1.34*  CALCIUM 8.6* 9.0 9.0 9.4 8.3*   ------------------------------------------------------------------------------------------------------------------ No results for input(s): CHOL, HDL, LDLCALC, TRIG, CHOLHDL, LDLDIRECT in the last 72 hours.  Lab Results  Component Value Date   HGBA1C 5.7 (H) 09/13/2017   ------------------------------------------------------------------------------------------------------------------ Recent Labs    05/15/21 2326  TSH 0.935   ------------------------------------------------------------------------------------------------------------------ No results for input(s): VITAMINB12, FOLATE, FERRITIN, TIBC, IRON, RETICCTPCT in the last 72 hours.  Coagulation profile Recent Labs  Lab 05/15/21 0440 05/16/21 0551 05/17/21 0623 05/17/21 1200 05/18/21 0639  INR 2.3* 1.6* 1.6* 1.4* 1.2    No results for input(s): DDIMER in the last 72 hours.  Cardiac Enzymes No results for input(s): CKMB, TROPONINI, MYOGLOBIN in the last 168 hours.  Invalid input(s): CK ------------------------------------------------------------------------------------------------------------------    Component Value Date/Time   BNP 238.0 (H) 09/12/2017 6754     Roxan Hockey M.D on 05/18/2021 at 6:16 PM  Go to [email or web address] - for contact info  [facility] - Office  [phone]

## 2021-05-18 NOTE — Progress Notes (Signed)
Subjective: 1 Day Post-Op Procedure(s) (LRB): CANNULATED HIP PINNING (Right)  Chart review   Objective: Vital signs in last 24 hours: Temp:  [97.7 F (36.5 C)-98.4 F (36.9 C)] 98.4 F (36.9 C) (06/03 2102) Pulse Rate:  [75-93] 82 (06/03 2102) Resp:  [10-23] 17 (06/03 2102) BP: (145-177)/(68-81) 155/77 (06/03 2102) SpO2:  [91 %-98 %] 91 % (06/03 2102)  Intake/Output from previous day: 06/03 0701 - 06/04 0700 In: 1447.5 [I.V.:1347.5; IV Piggyback:100] Out: 25 [Blood:25] Intake/Output this shift: No intake/output data recorded.  Recent Labs    05/16/21 0551 05/17/21 0623 05/18/21 0639  HGB 11.4* 12.0 10.9*   Recent Labs    05/17/21 0623 05/18/21 0639  WBC 13.0* 11.3*  RBC 3.61* 3.32*  HCT 36.9 34.3*  PLT 866* 727*   Recent Labs    05/17/21 0623 05/18/21 0639  NA 140 142  K 3.9 3.3*  CL 100 106  CO2 27 27  BUN 53* 48*  CREATININE 1.57* 1.34*  GLUCOSE 120* 102*  CALCIUM 9.4 8.3*   Recent Labs    05/17/21 1200 05/18/21 0639  INR 1.4* 1.2       Assessment/Plan: 1 Day Post-Op Procedure(s) (LRB): CANNULATED HIP PINNING (Right) Pre renal  Hypokalemia  Correction of above per the medical service   Orthopaedics: PT attempt to get out of bed   The postoperative plan is for immediate weightbearing as tolerated  Radiographs at 4 and 12 weeks.  DVT prophylaxis will be provided as well for one month.  Post op appt in 1 month   subq stitches      Arther Abbott 05/18/2021, 9:42 AM

## 2021-05-18 NOTE — Progress Notes (Signed)
Patient more alert this morning, requesting something to drink. Mouth care provided.

## 2021-05-18 NOTE — Progress Notes (Signed)
ANTICOAGULATION CONSULT NOTE -   Pharmacy Consult for warfarin Indication: atrial fibrillation  Allergies  Allergen Reactions  . Adhesive [Tape] Other (See Comments)    REACTION: pulls skin  . Codeine     UNKNOWN REACTION  . Prednisone     UNKNOWN  . Sulfonamide Derivatives     UNKNOWN REACTION  . Vicodin [Hydrocodone-Acetaminophen]     "Intolerance "    Patient Measurements:   Heparin Dosing Weight:   Vital Signs: Temp: 98.4 F (36.9 C) (06/03 2102) Temp Source: Oral (06/03 2102) BP: 155/77 (06/03 2102) Pulse Rate: 82 (06/03 2102)  Labs: Recent Labs    05/16/21 0551 05/17/21 0623 05/17/21 1200 05/18/21 0639  HGB 11.4* 12.0  --  10.9*  HCT 34.9* 36.9  --  34.3*  PLT 744* 866*  --  727*  LABPROT 18.7* 18.6* 17.3* 15.4*  INR 1.6* 1.6* 1.4* 1.2  CREATININE 1.63* 1.57*  --  1.34*    CrCl cannot be calculated (Unknown ideal weight.).   Medical History: Past Medical History:  Diagnosis Date  . Anxiety   . Arthritis   . Atrial fibrillation ([place])   . Constipation   . Depression   . Dyspnea   . Dysrhythmia    A-Fib  . GERD (gastroesophageal reflux disease)   . Hyperlipidemia   . Hypertension   . Hypothyroidism   . Nausea & vomiting   . T12 compression fracture ([place]) 06/2017  . Urinary frequency     Medications:  Medications Prior to Admission  Medication Sig Dispense Refill Last Dose  . ALPRAZolam (XANAX) 0.5 MG tablet Take 1 tablet (0.5 mg total) by mouth at bedtime as needed for anxiety or sleep.  0 04/25/2021 at 1735  . bisacodyl (DULCOLAX) 10 MG suppository Place 10 mg rectally every three (3) days as needed for moderate constipation.   PRN  . DULoxetine (CYMBALTA) 20 MG capsule Take 20 mg by mouth daily.   05/13/2021 at [place]  . feeding supplement, ENSURE ENLIVE, (ENSURE ENLIVE) LIQD Take 237 mLs by mouth 2 (two) times daily between meals. (Patient taking differently: Take 237 mLs by mouth daily.)   05/13/2021 at 0947  . furosemide (LASIX) 20 MG  tablet Take 20 mg by mouth daily.   05/13/2021 at [place]  . HYDROcodone-acetaminophen (NORCO/VICODIN) 5-325 MG tablet Take 1 tablet by mouth 2 (two) times daily as needed.   04/25/2021 at 1936  . levothyroxine (SYNTHROID) 88 MCG tablet Take 88 mcg by mouth daily.   05/13/2021 at [place]  . loratadine (CLARITIN) 5 MG chewable tablet [id number]  loratadine 5 mg tablet Administer 1 tab(s) By mouth once a day   05/13/2021 at [place]  . Magnesium Oxide -Mg Supplement 250 MG TABS Take 1 tablet by mouth at bedtime.   05/13/2021 at [place]  . pantoprazole (PROTONIX) 40 MG tablet Take 40 mg by mouth daily.   05/13/2021 at [place]  . polyethylene glycol (MIRALAX / GLYCOLAX) 17 g packet Take 17 g by mouth daily as needed.   02/25/2021 at 0900  . potassium chloride (MICRO-K) 10 MEQ CR capsule Take 10 mEq by mouth daily.   05/13/2021 at [place]  . simvastatin (ZOCOR) 10 MG tablet Take 10 mg by mouth at bedtime.   05/13/2021 at [place]  . sucralfate (CARAFATE) 1 g tablet Take 1 g by mouth 4 (four) times daily.   05/13/2021 at 1650  . warfarin (COUMADIN) 3 MG tablet Take 3 mg by mouth daily. Every Mon, Wed, Fri, Sat  05/13/2021 at [place]  . warfarin (COUMADIN) 6 MG tablet Take 6 mg by mouth. Every [place], Sun   05/13/2021 at 1839  . hydroxyurea (HYDREA) 500 MG capsule Take 1 capsule (500 mg total) by mouth every other day. May take with food to minimize GI side effects. (Patient not taking: No sig reported) 30 capsule 6 Not Taking at Unknown time  . prochlorperazine (COMPAZINE) 5 MG tablet Take 1 tablet (5 mg total) by mouth every 6 (six) hours as needed for nausea or vomiting. (Patient not taking: No sig reported) 60 tablet 3     Assessment: Pharmacy consulted to dose warfarin in patient with atrial fibrillation.  Patient completed procedure 6/3 with plans of restarting warfarin on 6/4.  Home dose listed as 3 mg every M-W-F-Sat and 6 mg ROW.   Goal of Therapy:  INR 2-3 Monitor platelets by anticoagulation protocol: Yes   Plan:   Warfarin 6 mg x 1 dose. Monitor daily INR and H&H  Margot Ables, PharmD Clinical Pharmacist 05/18/2021 8:20 AM

## 2021-05-18 NOTE — TOC Progression Note (Signed)
Transition of Care [facility]) - Progression Note    Patient Details  Name: Leslie Rosario MRN: [id number] Date of Birth: 01/25/1923  [facility] CM/SW Contact  Natasha Bence, LCSW Phone Number: 05/18/2021, 3:07 PM  Clinical Narrative:    CSW faxed auth for patient with updated progress note and PT note. TOC to follow.   Expected Discharge Plan: Skilled Nursing Facility Barriers to Discharge: Continued Medical Work up  Expected Discharge Plan and Services Expected Discharge Plan: [place]                                               Social Determinants of Health (SDOH) Interventions    Readmission Risk Interventions Readmission Risk Prevention Plan 05/16/2021  Transportation Screening Complete  HRI or Home Care Consult Complete  Social Work Consult for [place] Planning/Counseling Complete  Palliative Care Screening Not Applicable  Medication Review [employer]) Complete  Some recent data might be hidden

## 2021-05-18 NOTE — Progress Notes (Signed)
Patient's diet was changed today to dysphagia 2 and honey thick liquids. During feeding today for lunch and dinner she was having difficulty swallowing. This nurse had to encourage pt several times to swallow and she would hold food in her mouth for a couple minutes as if she forgot how to swallow.. During dinner fluids were given with spoon and she continued to hold in her mouth and stated "I need to spit" but was unable to do It. Wet sounds and gargling was heard in mouth. Due to the risk of aspiration, this nurse call weekend speech therapist Clyde Canterbury via phone and she recommended to put patient back on NPO until Monday when she can do the modified swallow test for her. Will continue to monitor and notify family.

## 2021-05-19 LAB — CBC
HCT: 31.8 % — ABNORMAL LOW (ref 36.0–46.0)
Hemoglobin: 10.1 g/dL — ABNORMAL LOW (ref 12.0–15.0)
MCH: 32.4 pg (ref 26.0–34.0)
MCHC: 31.8 g/dL (ref 30.0–36.0)
MCV: 101.9 fL — ABNORMAL HIGH (ref 80.0–100.0)
Platelets: 752 10*3/uL — ABNORMAL HIGH (ref 150–400)
RBC: 3.12 MIL/uL — ABNORMAL LOW (ref 3.87–5.11)
RDW: 16.4 % — ABNORMAL HIGH (ref 11.5–15.5)
WBC: 16.5 10*3/uL — ABNORMAL HIGH (ref 4.0–10.5)
nRBC: 0 % (ref 0.0–0.2)

## 2021-05-19 LAB — BASIC METABOLIC PANEL
Anion gap: 7 (ref 5–15)
BUN: 46 mg/dL — ABNORMAL HIGH (ref 8–23)
CO2: 26 mmol/L (ref 22–32)
Calcium: 8.6 mg/dL — ABNORMAL LOW (ref 8.9–10.3)
Chloride: 109 mmol/L (ref 98–111)
Creatinine, Ser: 1.26 mg/dL — ABNORMAL HIGH (ref 0.44–1.00)
GFR, Estimated: 39 mL/min — ABNORMAL LOW (ref >60)
Glucose, Bld: 139 mg/dL — ABNORMAL HIGH (ref 70–99)
Potassium: 3.8 mmol/L (ref 3.5–5.1)
Sodium: 142 mmol/L (ref 135–145)

## 2021-05-19 LAB — PROTIME-INR
INR: 1.4 — ABNORMAL HIGH (ref 0.8–1.2)
Prothrombin Time: 17.2 seconds — ABNORMAL HIGH (ref 11.4–15.2)

## 2021-05-19 MED ORDER — WARFARIN SODIUM 5 MG PO TABS
5.0000 mg | ORAL_TABLET | Freq: Once | ORAL | Status: AC
  Administered 2021-05-19: 5 mg via ORAL
  Filled 2021-05-19: qty 1

## 2021-05-19 NOTE — Progress Notes (Signed)
ANTICOAGULATION CONSULT NOTE -   Pharmacy Consult for warfarin Indication: atrial fibrillation  Allergies  Allergen Reactions  . Adhesive [Tape] Other (See Comments)    REACTION: pulls skin  . Codeine     UNKNOWN REACTION  . Prednisone     UNKNOWN  . Sulfonamide Derivatives     UNKNOWN REACTION  . Vicodin [Hydrocodone-Acetaminophen]     "Intolerance "    Patient Measurements:   Heparin Dosing Weight:   Vital Signs: Temp: 98 F (36.7 C) (06/04 2100) Temp Source: Oral (06/04 2100) BP: 158/70 (06/05 0544) Pulse Rate: 71 (06/05 0544)  Labs: Recent Labs    05/17/21 0623 05/17/21 1200 05/18/21 0639 05/19/21 0443  HGB 12.0  --  10.9* 10.1*  HCT 36.9  --  34.3* 31.8*  PLT 866*  --  727* 752*  LABPROT 18.6* 17.3* 15.4* 17.2*  INR 1.6* 1.4* 1.2 1.4*  CREATININE 1.57*  --  1.34* 1.26*    CrCl cannot be calculated (Unknown ideal weight.).   Medical History: Past Medical History:  Diagnosis Date  . Anxiety   . Arthritis   . Atrial fibrillation ([place])   . Constipation   . Depression   . Dyspnea   . Dysrhythmia    A-Fib  . GERD (gastroesophageal reflux disease)   . Hyperlipidemia   . Hypertension   . Hypothyroidism   . Nausea & vomiting   . T12 compression fracture ([place]) 06/2017  . Urinary frequency     Medications:  Medications Prior to Admission  Medication Sig Dispense Refill Last Dose  . ALPRAZolam (XANAX) 0.5 MG tablet Take 1 tablet (0.5 mg total) by mouth at bedtime as needed for anxiety or sleep.  0 04/25/2021 at 1735  . bisacodyl (DULCOLAX) 10 MG suppository Place 10 mg rectally every three (3) days as needed for moderate constipation.   PRN  . DULoxetine (CYMBALTA) 20 MG capsule Take 20 mg by mouth daily.   05/13/2021 at [place]  . feeding supplement, ENSURE ENLIVE, (ENSURE ENLIVE) LIQD Take 237 mLs by mouth 2 (two) times daily between meals. (Patient taking differently: Take 237 mLs by mouth daily.)   05/13/2021 at 0947  . furosemide (LASIX) 20 MG  tablet Take 20 mg by mouth daily.   05/13/2021 at [place]  . HYDROcodone-acetaminophen (NORCO/VICODIN) 5-325 MG tablet Take 1 tablet by mouth 2 (two) times daily as needed.   04/25/2021 at 1936  . levothyroxine (SYNTHROID) 88 MCG tablet Take 88 mcg by mouth daily.   05/13/2021 at [place]  . loratadine (CLARITIN) 5 MG chewable tablet [id number]  loratadine 5 mg tablet Administer 1 tab(s) By mouth once a day   05/13/2021 at [place]  . Magnesium Oxide -Mg Supplement 250 MG TABS Take 1 tablet by mouth at bedtime.   05/13/2021 at [place]  . pantoprazole (PROTONIX) 40 MG tablet Take 40 mg by mouth daily.   05/13/2021 at [place]  . polyethylene glycol (MIRALAX / GLYCOLAX) 17 g packet Take 17 g by mouth daily as needed.   02/25/2021 at 0900  . potassium chloride (MICRO-K) 10 MEQ CR capsule Take 10 mEq by mouth daily.   05/13/2021 at [place]  . simvastatin (ZOCOR) 10 MG tablet Take 10 mg by mouth at bedtime.   05/13/2021 at [place]  . sucralfate (CARAFATE) 1 g tablet Take 1 g by mouth 4 (four) times daily.   05/13/2021 at 1650  . warfarin (COUMADIN) 3 MG tablet Take 3 mg by mouth daily. Every Mon, Wed, Fri, Sat  05/13/2021 at [place]  . warfarin (COUMADIN) 6 MG tablet Take 6 mg by mouth. Every [place], Sun   05/13/2021 at 1839  . hydroxyurea (HYDREA) 500 MG capsule Take 1 capsule (500 mg total) by mouth every other day. May take with food to minimize GI side effects. (Patient not taking: No sig reported) 30 capsule 6 Not Taking at Unknown time  . prochlorperazine (COMPAZINE) 5 MG tablet Take 1 tablet (5 mg total) by mouth every 6 (six) hours as needed for nausea or vomiting. (Patient not taking: No sig reported) 60 tablet 3     Assessment: Pharmacy consulted to dose warfarin in patient with atrial fibrillation.  Patient completed procedure 6/3 with plans of restarting warfarin on 6/4.  Home dose listed as 3 mg every M-W-F-Sat and 6 mg ROW.  INR 1.2 > 1.4  Goal of Therapy:  INR 2-3 Monitor platelets by anticoagulation protocol: Yes    Plan:  Warfarin 5 mg x 1 dose. Monitor daily INR and H&H  Margot Ables, PharmD Clinical Pharmacist 05/19/2021 8:19 AM

## 2021-05-19 NOTE — Progress Notes (Signed)
Patient Demographics:    Leslie Rosario, is a 87 y.o. female, DOB - 12-22-1922, XLK:[id number]  Admit date - 05/14/2021   Admitting Physician Ejiroghene Arlyce Dice, MD  Outpatient Primary MD for the patient is Celene Squibb, MD  LOS - 5   Chief Complaint  Patient presents with  . Fall        Subjective:    [name] today has no fevers, no emesis,  No chest pain,   [name] and [name] at bedside -more lethargic earlier today -Having challenges with oral intake with risk for aspiration concerns  Assessment  & Plan :    Principal Problem:   Closed right hip fracture [facility]) Active Problems:   Atrial fibrillation ([place])   Long term current use of anticoagulant therapy   Acute metabolic encephalopathy   Dysphagia   Hypothyroidism   Essential hypertension   AMS (altered mental status)  Brief Summary:- 87 y.o. female with medical history significant for atrial fibrillation on coumadin, mitral regurgitation, HTN admitted on 05/14/2021 with Right Hip Fx after a mechanical fall, subsequently after 10 PM on 05/14/2020 developed unresponsive episode presumably after getting fentanyl in the ED previously, was found to be in flash pulmonary edema responded well to IV Lasix   A/p 1) acute metabolic encephalopathy--- unresponsive episode after 10 PM on 05/14/2021--- more awake, more alert, more interactive CT head and CT C-spine without acute findings-- -Suspect this was related to IV fentanyl and transient hemodynamic compromise on the day of admission -Now resolved -According to patient's son who is at bedside patient appears back to baseline  2)HFpEF--- patient appears to have chronic diastolic dysfunction CHF,  --echo from 09/13/2017 with EF of 60 to 65%, on 05/14/2021 patient had "flash pulmonary edema" after 10 PM after receiving IV fentanyl previously in the ED -- please see chest x-ray report, she responded  well to IV Lasix -Continue Lasix -Updated echo requested -Repeat chest x-ray on 05/16/2021 shows resolving edema -Monitor fluid intake and output and daily weight carefully especially given underlying CKD 4  3) chronic atrial fibrillation--resumed Coumadin on 05/18/2021  -- Continue metoprolol 25 mg p.o. twice daily for rate control  4) chronic anticoagulation--- in the setting of chronic A. fib, s/p right hip surgery,  -Coumadin therapy as above #3  5)AKI----acute kidney injury on CKD stage -iV   creatinine on admission= 2.07  , baseline creatinine =  1.7 to 1.8   ,  -creatinine is now=  1.3 (renal function appears to be back to baseline) - renally adjust medications, avoid nephrotoxic agents / dehydration  / hypotension --Monitor fluid intake and output and daily weight carefully especially given underlying CKD 4  6)Leukocytosis-- ??  Reactive secondary to hip fracture and flash pulmonary edema -No fevers or other evidence of acute infection at this time Urine and blood cultures- NGTD --no antibiotics indicated at this time  7)Dysphagia/Swallowing concerns--- speech eval appreciated, recommends Dysphagia 2 (fine chop);Honey-thick liquid ---aspiration precautions -With honey thickened liquids patient is at risk for dehydration and readmission -With thin liquids patient is at risk for aspiration and respiratory distress -Family trying to make decisions regarding what they are comfortable with -Palliative care consult requested to help further delineate goals of care and priorities  8)Right Hip Fracture--- discussed with orthopedic surgeon Dr. Arther Abbott  S/p ORIF on 05/17/2021 (CANNULATED HIP PINNING (Right) - -The postoperative plan is for immediate weightbearing as tolerated Radiographs at 2, 6 and 12 weeks. DVT prophylaxis (patient already on Coumadin for A. Fib) Post op appt in 1 month , subq stitches  -May continue aspirin for another couple days as INR is not therapeutic  yet   9)Hypothyroidism--- continue levothyroxine 88 mcg daily,  TSH 0.935  10)Thrombocytosis --platelets appears to be close to her baseline,  Follows with Dr. Delton Coombes -Continue hydroxyurea.  11) social/ethics--patient is a DNR/DNI plan of care discussed with patient's son Leslie Rosario and steve at bedside --With honey thickened liquids patient is at risk for dehydration and readmission -With thin liquids patient is at risk for aspiration and respiratory distress -Family trying to make decisions regarding what they are comfortable with -Palliative care consult requested to help further delineate goals of care and priorities  Disposition/Need for in-Hospital Stay- patient unable to be discharged at this time due to --awaiting transfer to SNF rehab  Status is: Inpatient  Remains inpatient appropriate because:Please see disposition above   Disposition: The patient is from: ALF [facility]              Anticipated d/c is to: SNF              Anticipated d/c date is: 2 days              Patient currently is medically stable to d/c. Barriers: Awaiting transfer to SNF rehab Code Status :  -  Code Status: DNR   Family Communication:   Discussed with son [name] and Leslie Rosario  Consults  :  Ortho/speech  DVT Prophylaxis  :   - SCDs   SCDs Start: 05/17/21 1543 SCDs Start: 05/14/21 2032   Lab Results  Component Value Date   PLT 752 (H) 05/19/2021    Inpatient Medications  Scheduled Meds: . acetaminophen  650 mg Rectal QID  . docusate sodium  100 mg Oral BID  . Warfarin - Pharmacist Dosing Inpatient   Does not apply q1600   Continuous Infusions: . lactated ringers 10 mL/hr at 05/19/21 0418  . methocarbamol (ROBAXIN) IV     PRN Meds:.bisacodyl, magnesium citrate, menthol-cetylpyridinium **OR** phenol, methocarbamol **OR** methocarbamol (ROBAXIN) IV, metoCLOPramide **OR** metoCLOPramide (REGLAN) injection, morphine injection, ondansetron **OR** ondansetron (ZOFRAN) IV,  traMADol    Anti-infectives (From admission, onward)   Start     Dose/Rate Route Frequency Ordered Stop   05/17/21 1930  ceFAZolin (ANCEF) IVPB 2g/100 mL premix        2 g 200 mL/hr over 30 Minutes Intravenous Every 6 hours 05/17/21 1542 05/18/21 0233   05/17/21 0600  ceFAZolin (ANCEF) IVPB 2g/100 mL premix        2 g 200 mL/hr over 30 Minutes Intravenous On call to O.R. 05/16/21 1259 05/17/21 1322   05/15/21 0600  ceFAZolin (ANCEF) IVPB 2g/100 mL premix        2 g 200 mL/hr over 30 Minutes Intravenous On call to O.R. 05/14/21 2315 05/16/21 0559        Objective:   Vitals:   05/18/21 1442 05/18/21 2100 05/19/21 0544 05/19/21 1314  BP: (!) 131/57 136/63 (!) 158/70 (!) 146/67  Pulse: 75 70 71 90  Resp:    16  Temp: 97.8 F (36.6 C) 98 F (36.7 C)  98 F (36.7 C)  TempSrc: Oral Oral  Oral  SpO2: 96% 94% 98%  98%    Wt Readings from Last 3 Encounters:  04/17/21 45.2 kg  02/20/21 46.8 kg  01/22/21 46.6 kg     Intake/Output Summary (Last 24 hours) at 05/19/2021 1754 Last data filed at 05/19/2021 1300 Gross per 24 hour  Intake 0 ml  Output 750 ml  Net -750 ml     Physical Exam  Gen:- Awake Alert,  In no apparent distress  HEENT:- [place].AT, No sclera icterus Neck-Supple Neck,No JVD,.  Lungs-improved air movement, no rales, no wheezing CV- S1, S2 normal, irregularly irregular  abd-  +ve B.Sounds, Abd Soft, No tenderness,    Extremity/Skin:- No  edema, pedal pulses present  Psych -underlying/baseline cognitive and memory deficits  neuro-generalized weakness without additional new focal deficits, no tremors MSK-postop wound intact,, point tenderness on palpation over Rt hip area   Data Review:   Micro Results Recent Results (from the past 240 hour(s))  Resp Panel by RT-PCR (Flu A&B, Covid) Nasopharyngeal Swab     Status: None   Collection Time: 05/14/21  6:24 PM   Specimen: Nasopharyngeal Swab; Nasopharyngeal(NP) swabs in vial transport medium  Result Value Ref Range  Status   SARS Coronavirus 2 by RT PCR NEGATIVE NEGATIVE Final    Comment: (NOTE) SARS-CoV-2 target nucleic acids are NOT DETECTED.  The SARS-CoV-2 RNA is generally detectable in upper respiratory specimens during the acute phase of infection. The lowest concentration of SARS-CoV-2 viral copies this assay can detect is 138 copies/mL. A negative result does not preclude SARS-Cov-2 infection and should not be used as the sole basis for treatment or other patient management decisions. A negative result may occur with  improper specimen collection/handling, submission of specimen other than nasopharyngeal swab, presence of viral mutation(s) within the areas targeted by this assay, and inadequate number of viral copies(<138 copies/mL). A negative result must be combined with clinical observations, patient history, and epidemiological information. The expected result is Negative.  Fact Sheet for Patients:  EntrepreneurPulse.com.au  Fact Sheet for Healthcare Providers:  IncredibleEmployment.be  This test is no t yet approved or cleared by the [place] FDA and  has been authorized for detection and/or diagnosis of SARS-CoV-2 by FDA under an Emergency Use Authorization (EUA). This EUA will remain  in effect (meaning this test can be used) for the duration of the COVID-19 declaration under Section 564(b)(1) of the Act, 21 U.S.C.section 360bbb-3(b)(1), unless the authorization is terminated  or revoked sooner.       Influenza A by PCR NEGATIVE NEGATIVE Final   Influenza B by PCR NEGATIVE NEGATIVE Final    Comment: (NOTE) The Xpert Xpress SARS-CoV-2/FLU/RSV plus assay is intended as an aid in the diagnosis of influenza from Nasopharyngeal swab specimens and should not be used as a sole basis for treatment. Nasal washings and aspirates are unacceptable for Xpert Xpress SARS-CoV-2/FLU/RSV testing.  Fact Sheet for  Patients: EntrepreneurPulse.com.au  Fact Sheet for Healthcare Providers: IncredibleEmployment.be  This test is not yet approved or cleared by the [place] FDA and has been authorized for detection and/or diagnosis of SARS-CoV-2 by FDA under an Emergency Use Authorization (EUA). This EUA will remain in effect (meaning this test can be used) for the duration of the COVID-19 declaration under Section 564(b)(1) of the Act, 21 U.S.C. section 360bbb-3(b)(1), unless the authorization is terminated or revoked.  Performed at [facility], [address] [place], [place] [address]   MRSA PCR Screening     Status: None   Collection Time: 05/14/21 10:58 PM  Specimen: Nasal Mucosa; Nasopharyngeal  Result Value Ref Range Status   MRSA by PCR NEGATIVE NEGATIVE Final    Comment:        The GeneXpert MRSA Assay (FDA approved for NASAL specimens only), is one component of a comprehensive MRSA colonization surveillance program. It is not intended to diagnose MRSA infection nor to guide or monitor treatment for MRSA infections. Performed at [facility], [address], [place], [place] [address]   Urine Culture     Status: Abnormal   Collection Time: 05/15/21  6:01 PM   Specimen: Urine, Catheterized  Result Value Ref Range Status   Specimen Description   Final    URINE, CATHETERIZED Performed at [facility], [address], [place], [place] [address]    Special Requests   Final    Normal Performed at [facility], [address], [place], [place] [address]    Culture MULTIPLE SPECIES PRESENT, SUGGEST RECOLLECTION (A)  Final   Report Status 05/17/2021 FINAL  Final  Culture, blood (Routine X 2) w Reflex to ID Panel     Status: None (Preliminary result)   Collection Time: 05/15/21 11:26 PM   Specimen: BLOOD RIGHT FOREARM  Result Value Ref Range Status   Specimen Description BLOOD RIGHT FOREARM  Final   Special Requests   Final    BOTTLES DRAWN  AEROBIC AND ANAEROBIC Blood Culture adequate volume   Culture   Final    NO GROWTH 4 DAYS Performed at [facility], [address], [place], [place] [address]    Report Status PENDING  Incomplete  Culture, blood (Routine X 2) w Reflex to ID Panel     Status: None (Preliminary result)   Collection Time: 05/15/21 11:29 PM   Specimen: Right Antecubital; Blood  Result Value Ref Range Status   Specimen Description RIGHT ANTECUBITAL  Final   Special Requests   Final    BOTTLES DRAWN AEROBIC AND ANAEROBIC Blood Culture adequate volume   Culture   Final    NO GROWTH 4 DAYS Performed at [facility], [address], [place], [place] [address]    Report Status PENDING  Incomplete  Surgical PCR screen     Status: None   Collection Time: 05/17/21  5:51 AM   Specimen: Nasal Mucosa; Nasal Swab  Result Value Ref Range Status   MRSA, PCR NEGATIVE NEGATIVE Final   Staphylococcus aureus NEGATIVE NEGATIVE Final    Comment: (NOTE) The Xpert SA Assay (FDA approved for NASAL specimens in patients 61 years of age and older), is one component of a comprehensive surveillance program. It is not intended to diagnose infection nor to guide or monitor treatment. Performed at [facility], [address], [place], [place] [address]     Radiology Reports DG Chest 1 View  Result Date: 05/14/2021 CLINICAL DATA:  Fall, hip pain EXAM: CHEST  1 VIEW COMPARISON:  11/12/2018 FINDINGS: Heart is borderline in size. Peribronchial thickening, vascular congestion and interstitial prominence. This could reflect interstitial edema. No confluent opacities or effusions. IMPRESSION: Bronchitic changes. Suspect mild vascular congestion and interstitial edema. Electronically Signed   By: Rolm Baptise M.D.   On: 05/14/2021 18:19   CT HEAD WO CONTRAST  Result Date: 05/14/2021 CLINICAL DATA:  87 year old female with encephalopathy. EXAM: CT HEAD WITHOUT CONTRAST TECHNIQUE: Contiguous axial images were obtained from the base of the  skull through the vertex without intravenous contrast. COMPARISON:  Head CT dated 05/14/2021. FINDINGS: Brain: Moderate age-related atrophy and chronic microvascular ischemic changes. There  is no acute intracranial hemorrhage. No mass effect or midline shift. No extra-axial fluid collection. Vascular: No hyperdense vessel or unexpected calcification. Skull: Normal. Negative for fracture or focal lesion. Sinuses/Orbits: Right maxillary sinus retention cyst or polyp. The remainder of the visualized paranasal sinuses and mastoid air cells are clear. Other: None IMPRESSION: 1. No acute intracranial pathology. 2. Moderate age-related atrophy and chronic microvascular ischemic changes. Electronically Signed   By: Anner Crete M.D.   On: 05/14/2021 23:01   CT Head Wo Contrast  Result Date: 05/14/2021 CLINICAL DATA:  Status post fall out of bed.  Facial laceration. EXAM: CT HEAD WITHOUT CONTRAST CT CERVICAL SPINE WITHOUT CONTRAST TECHNIQUE: Multidetector CT imaging of the head and cervical spine was performed following the standard protocol without intravenous contrast. Multiplanar CT image reconstructions of the cervical spine were also generated. COMPARISON:  Head CT 09/12/2017.  CT neck 10/10/2016. FINDINGS: CT HEAD FINDINGS Brain: No evidence of acute infarction, hemorrhage, hydrocephalus, extra-axial collection or mass lesion/mass effect. Vascular: No hyperdense vessel or unexpected calcification. Skull: Intact.  No focal lesion. Sinuses/Orbits: Wall and mucosal thickening in the right maxillary sinus consistent with chronic sinus disease appear unchanged. Mild ethmoid air cell disease on left noted. Other: None. CT CERVICAL SPINE FINDINGS Alignment: Normal. Skull base and vertebrae: No acute fracture. No primary bone lesion or focal pathologic process. Soft tissues and spinal canal: No prevertebral fluid or swelling. No visible canal hematoma. Disc levels: Mild multilevel loss of disc space height and facet  arthropathy noted. Upper chest: There is interlobular septal thickening and ground-glass attenuation in the lung apices. Other: None. IMPRESSION: No acute abnormality head or cervical spine. Appearance of the lung apices suggestive of interstitial edema. Electronically Signed   By: Inge Rise M.D.   On: 05/14/2021 17:54   CT Cervical Spine Wo Contrast  Result Date: 05/14/2021 CLINICAL DATA:  Status post fall out of bed.  Facial laceration. EXAM: CT HEAD WITHOUT CONTRAST CT CERVICAL SPINE WITHOUT CONTRAST TECHNIQUE: Multidetector CT imaging of the head and cervical spine was performed following the standard protocol without intravenous contrast. Multiplanar CT image reconstructions of the cervical spine were also generated. COMPARISON:  Head CT 09/12/2017.  CT neck 10/10/2016. FINDINGS: CT HEAD FINDINGS Brain: No evidence of acute infarction, hemorrhage, hydrocephalus, extra-axial collection or mass lesion/mass effect. Vascular: No hyperdense vessel or unexpected calcification. Skull: Intact.  No focal lesion. Sinuses/Orbits: Wall and mucosal thickening in the right maxillary sinus consistent with chronic sinus disease appear unchanged. Mild ethmoid air cell disease on left noted. Other: None. CT CERVICAL SPINE FINDINGS Alignment: Normal. Skull base and vertebrae: No acute fracture. No primary bone lesion or focal pathologic process. Soft tissues and spinal canal: No prevertebral fluid or swelling. No visible canal hematoma. Disc levels: Mild multilevel loss of disc space height and facet arthropathy noted. Upper chest: There is interlobular septal thickening and ground-glass attenuation in the lung apices. Other: None. IMPRESSION: No acute abnormality head or cervical spine. Appearance of the lung apices suggestive of interstitial edema. Electronically Signed   By: Inge Rise M.D.   On: 05/14/2021 17:54   DG CHEST PORT 1 VIEW  Result Date: 05/16/2021 CLINICAL DATA:  87 year old female with  shortness of breath. EXAM: PORTABLE CHEST 1 VIEW COMPARISON:  Portable chest 05/14/2021 and earlier. FINDINGS: Portable AP semi upright view at 0521 hours. Larger lung volumes and decreased bilateral pulmonary interstitial opacity. Lung markings appear to be near baseline now. Stable cardiac size and mediastinal contours. No definite cardiomegaly.  Visualized tracheal air column is within normal limits. No pneumothorax or pleural effusion. Sequelae of thoracolumbar junction region vertebral plasty. Stable visualized osseous structures. Paucity of bowel gas in the upper abdomen. IMPRESSION: Improved lung volumes and ventilation since 05/14/2020 with resolving edema or less likely viral/atypical infection. Electronically Signed   By: Genevie Ann M.D.   On: 05/16/2021 06:34   DG CHEST PORT 1 VIEW  Result Date: 05/14/2021 CLINICAL DATA:  Unresponsive EXAM: PORTABLE CHEST 1 VIEW COMPARISON:  05/14/2021 FINDINGS: Cardiac shadow is stable. Aortic calcifications are again seen. Lungs are well aerated bilaterally. Increasing interstitial edema is noted. No confluent infiltrate or effusion is noted. Changes of prior vertebral augmentation are again seen. IMPRESSION: Increasing pulmonary edema. Electronically Signed   By: Inez Catalina M.D.   On: 05/14/2021 23:18   ECHOCARDIOGRAM COMPLETE  Result Date: 05/15/2021    ECHOCARDIOGRAM REPORT   Patient Name:   MYKEISHA DYSERT Date of Exam: 05/15/2021 Medical Rec #:  [id number]    Height:       63.0 in Accession #:    [id number]   Weight:       99.6 lb Date of Birth:  17-Jan-1923     BSA:          1.438 m Patient Age:    71 years     BP:           150/71 mmHg Patient Gender: F            HR:           86 bpm. Exam Location:  [facility] Procedure: 2D Echo, Cardiac Doppler and Color Doppler Indications:    CHF  History:        Patient has prior history of Echocardiogram examinations, most                 recent 09/13/2017. Mitral regurgitation, Arrythmias:Atrial                 Fibrillation,  Signs/Symptoms:Altered Mental Status, Dyspnea and                 Pulmonary edema; Risk Factors:Hypertension and Dyslipidemia.                 Right hip fracture.  Sonographer:    Dustin Flock RDCS Referring Phys: [phone] [facility]  Sonographer Comments: No apical window and no subcostal window. Right hip fracture and very thin body habitus. IMPRESSIONS  1. Left ventricular ejection fraction, by estimation, is 60 to 65%. The left ventricle has normal function. Left ventricular endocardial border not optimally defined to evaluate regional wall motion. Left ventricular diastolic parameters are indeterminate.  2. Right ventricular systolic function was not well visualized. The right ventricular size is not well visualized. There is normal pulmonary artery systolic pressure.  3. The mitral valve was not well visualized. Mild mitral valve regurgitation. No evidence of mitral stenosis.  4. The aortic valve is tricuspid. Aortic valve regurgitation is not visualized. No aortic stenosis is present.  5. Limited views obtaine due to limitations on patient positioning. FINDINGS  Left Ventricle: Left ventricular ejection fraction, by estimation, is 60 to 65%. The left ventricle has normal function. Left ventricular endocardial border not optimally defined to evaluate regional wall motion. The left ventricular internal cavity size was normal in size. There is no left ventricular hypertrophy. Left ventricular diastolic parameters are indeterminate. Right Ventricle: The right ventricular size is not well visualized. Right vetricular wall thickness was not  well visualized. Right ventricular systolic function was not well visualized. There is normal pulmonary artery systolic pressure. The tricuspid regurgitant velocity is 1.82 m/s, and with an assumed right atrial pressure of 3 mmHg, the estimated right ventricular systolic pressure is 45.4 mmHg. Left Atrium: Left atrial size was not well visualized. Right Atrium: Right  atrial size was not well visualized. Pericardium: There is no evidence of pericardial effusion. Mitral Valve: The mitral valve was not well visualized. Mild mitral valve regurgitation. No evidence of mitral valve stenosis. Tricuspid Valve: The tricuspid valve is not well visualized. Tricuspid valve regurgitation is mild . No evidence of tricuspid stenosis. Aortic Valve: The aortic valve is tricuspid. Aortic valve regurgitation is not visualized. No aortic stenosis is present. Pulmonic Valve: The pulmonic valve was not well visualized. Pulmonic valve regurgitation is not visualized. No evidence of pulmonic stenosis. Aorta: The aortic root is normal in size and structure. Pulmonary Artery: Indeterminant PASP, inadequate TR jet. IAS/Shunts: The interatrial septum was not well visualized.  LEFT VENTRICLE PLAX 2D LVIDd:         3.23 cm LVIDs:         2.14 cm LV PW:         0.89 cm LV IVS:        0.89 cm LVOT diam:     1.90 cm LVOT Area:     2.84 cm  LEFT ATRIUM         Index LA diam:    3.80 cm 2.64 cm/m   AORTA Ao Root diam: 2.30 cm TRICUSPID VALVE TR Peak grad:   13.2 mmHg TR Vmax:        182.00 cm/s  SHUNTS Systemic Diam: 1.90 cm Carlyle Dolly MD Electronically signed by Carlyle Dolly MD Signature Date/Time: 05/15/2021/4:09:43 PM    Final    DG HIP OPERATIVE UNILAT WITH PELVIS RIGHT  Result Date: 05/17/2021 CLINICAL DATA:  Pinning of RIGHT hip EXAM: OPERATIVE RIGHT HIP (WITH PELVIS IF PERFORMED) 9 VIEWS TECHNIQUE: Fluoroscopic spot image(s) were submitted for interpretation post-operatively. COMPARISON:  05/14/2021 FLUOROSCOPY TIME:  1 minutes 13 seconds FINDINGS: Osseous demineralization. Images demonstrate placement of 3 cannulated screws across an impacted subcapital fracture of the RIGHT femoral neck. No dislocation. IMPRESSION: Post pinning of RIGHT femoral neck fracture. Electronically Signed   By: Lavonia Dana M.D.   On: 05/17/2021 15:30   DG Hip Unilat With Pelvis 2-3 Views Right  Result Date:  05/14/2021 CLINICAL DATA:  Fall, right hip pain EXAM: DG HIP (WITH OR WITHOUT PELVIS) 2-3V RIGHT COMPARISON:  07/18/2012 FINDINGS: There is a right femoral neck fracture noted. No subluxation or dislocation. Slight impaction. Early degenerative changes in the hips bilaterally. IMPRESSION: Right femoral neck fracture. Electronically Signed   By: Rolm Baptise M.D.   On: 05/14/2021 18:16     CBC Recent Labs  Lab 05/14/21 1721 05/14/21 2259 05/15/21 0440 05/16/21 0551 05/17/21 0623 05/18/21 0639 05/19/21 0443  WBC 9.7 18.8* 18.7* 12.1* 13.0* 11.3* 16.5*  HGB 11.5* 12.2 12.3 11.4* 12.0 10.9* 10.1*  HCT 35.3* 37.9 37.3 34.9* 36.9 34.3* 31.8*  PLT 865* 851* 847* 744* 866* 727* 752*  MCV 102.3* 101.3* 99.7 100.3* 102.2* 103.3* 101.9*  MCH 33.3 32.6 32.9 32.8 33.2 32.8 32.4  MCHC 32.6 32.2 33.0 32.7 32.5 31.8 31.8  RDW 17.1* 16.9* 16.7* 16.9* 16.8* 16.4* 16.4*  LYMPHSABS 1.6 2.0  --   --   --   --   --   MONOABS 0.8 1.2*  --   --   --   --   --  EOSABS 0.4 0.2  --   --   --   --   --   BASOSABS 0.0 0.1  --   --   --   --   --     Chemistries  Recent Labs  Lab 05/15/21 0440 05/16/21 0551 05/17/21 0623 05/18/21 0639 05/19/21 0443  NA 137 140 140 142 142  K 4.5 4.0 3.9 3.3* 3.8  CL 101 102 100 106 109  CO2 25 26 27 27 26   GLUCOSE 155* 117* 120* 102* 139*  BUN 39* 47* 53* 48* 46*  CREATININE 1.69* 1.63* 1.57* 1.34* 1.26*  CALCIUM 9.0 9.0 9.4 8.3* 8.6*   ------------------------------------------------------------------------------------------------------------------ No results for input(s): CHOL, HDL, LDLCALC, TRIG, CHOLHDL, LDLDIRECT in the last 72 hours.  Lab Results  Component Value Date   HGBA1C 5.7 (H) 09/13/2017   ------------------------------------------------------------------------------------------------------------------ No results for input(s): TSH, T4TOTAL, T3FREE, THYROIDAB in the last 72 hours.  Invalid input(s):  FREET3 ------------------------------------------------------------------------------------------------------------------ No results for input(s): VITAMINB12, FOLATE, FERRITIN, TIBC, IRON, RETICCTPCT in the last 72 hours.  Coagulation profile Recent Labs  Lab 05/16/21 0551 05/17/21 0623 05/17/21 1200 05/18/21 0639 05/19/21 0443  INR 1.6* 1.6* 1.4* 1.2 1.4*    No results for input(s): DDIMER in the last 72 hours.  Cardiac Enzymes No results for input(s): CKMB, TROPONINI, MYOGLOBIN in the last 168 hours.  Invalid input(s): CK ------------------------------------------------------------------------------------------------------------------    Component Value Date/Time   BNP 238.0 (H) 09/12/2017 5035     Roxan Hockey M.D on 05/19/2021 at 5:54 PM  Go to [email or web address] - for contact info  [facility] - Office  [phone]

## 2021-05-19 NOTE — Progress Notes (Signed)
This nurse was called to patient's room by Janett Billow, LPN to evaluate patient's right hip surgical site.  Upon reaching patient noticed dressing was completely saturated with blood.  As I was attempting to change the dressing a large blood clot fell from under the steri strips.  Clot was placed in specimen bottle to show MD. New mepilex dressing.  Patient surgical site began to bleed again after placing mepilex on it. Will monitor bleeding and notify MD as appropriate.

## 2021-05-20 ENCOUNTER — Encounter (HOSPITAL_COMMUNITY): Payer: Self-pay | Admitting: Orthopedic Surgery

## 2021-05-20 ENCOUNTER — Inpatient Hospital Stay (HOSPITAL_COMMUNITY): Payer: Medicare Other

## 2021-05-20 DIAGNOSIS — R638 Other symptoms and signs concerning food and fluid intake: Secondary | ICD-10-CM

## 2021-05-20 LAB — BASIC METABOLIC PANEL
Anion gap: 10 (ref 5–15)
BUN: 50 mg/dL — ABNORMAL HIGH (ref 8–23)
CO2: 27 mmol/L (ref 22–32)
Calcium: 9 mg/dL (ref 8.9–10.3)
Chloride: 111 mmol/L (ref 98–111)
Creatinine, Ser: 1.35 mg/dL — ABNORMAL HIGH (ref 0.44–1.00)
GFR, Estimated: 36 mL/min — ABNORMAL LOW (ref >60)
Glucose, Bld: 82 mg/dL (ref 70–99)
Potassium: 3.9 mmol/L (ref 3.5–5.1)
Sodium: 148 mmol/L — ABNORMAL HIGH (ref 135–145)

## 2021-05-20 LAB — CBC
HCT: 32.2 % — ABNORMAL LOW (ref 36.0–46.0)
Hemoglobin: 10.1 g/dL — ABNORMAL LOW (ref 12.0–15.0)
MCH: 32.8 pg (ref 26.0–34.0)
MCHC: 31.4 g/dL (ref 30.0–36.0)
MCV: 104.5 fL — ABNORMAL HIGH (ref 80.0–100.0)
Platelets: 905 10*3/uL (ref 150–400)
RBC: 3.08 MIL/uL — ABNORMAL LOW (ref 3.87–5.11)
RDW: 17.1 % — ABNORMAL HIGH (ref 11.5–15.5)
WBC: 13.4 10*3/uL — ABNORMAL HIGH (ref 4.0–10.5)
nRBC: 0 % (ref 0.0–0.2)

## 2021-05-20 LAB — PROTIME-INR
INR: 1.8 — ABNORMAL HIGH (ref 0.8–1.2)
Prothrombin Time: 21.2 seconds — ABNORMAL HIGH (ref 11.4–15.2)

## 2021-05-20 LAB — CULTURE, BLOOD (ROUTINE X 2)
Culture: NO GROWTH
Culture: NO GROWTH
Special Requests: ADEQUATE
Special Requests: ADEQUATE

## 2021-05-20 NOTE — Progress Notes (Signed)
Patient Demographics:    Leslie Rosario, is a 87 y.o. female, DOB - [birth year], LFY:[id number]  Admit date - 05/14/2021   Admitting Physician Ejiroghene Arlyce Dice, MD  Outpatient Primary MD for the patient is Celene Squibb, MD  LOS - 6   Chief Complaint  Patient presents with  . Fall        Subjective:    Leslie Rosario today has no fevers, no emesis,  No chest pain,   [name] had face-to-face conference with myself, palliative care provider and speech pathologist -No further bleeding from right hip surgical site -Oral intake remains challenging--poor prognosis with poor oral intake  Assessment  & Plan :    Principal Problem:   Closed right hip fracture [facility]) Active Problems:   Atrial fibrillation ([place])   Long term current use of anticoagulant therapy   Acute metabolic encephalopathy   Dysphagia   Hypothyroidism   Essential hypertension   AMS (altered mental status)  Brief Summary:- 87 y.o. female with medical history significant for atrial fibrillation on coumadin, mitral regurgitation, HTN admitted on 05/14/2021 with Right Hip Fx after a mechanical fall, subsequently after 10 PM on 05/14/2020 developed unresponsive episode presumably after getting fentanyl in the ED previously, was found to be in flash pulmonary edema responded well to IV Lasix   A/p 1) acute metabolic encephalopathy--- unresponsive episode after 10 PM on 05/14/2021--- more awake, more alert, more interactive CT head and CT C-spine without acute findings-- -Suspect this was related to IV fentanyl and transient hemodynamic compromise on the day of admission -Now resolved -According to patient's son who is at bedside patient appears back to baseline  2)HFpEF--- patient appears to have chronic diastolic dysfunction CHF,  --echo from 09/13/2017 with EF of 60 to 65%, on 05/14/2021 patient had "flash pulmonary edema" after 10 PM after  receiving IV fentanyl previously in the ED -- please see chest x-ray report, she responded well to IV Lasix -Continue Lasix -Updated echo requested -Repeat chest x-ray on 05/16/2021 shows resolving edema -Monitor fluid intake and output and daily weight carefully especially given underlying CKD 4  3) chronic atrial fibrillation--hold Coumadin due to right hip bleeding -- Continue metoprolol 25 mg p.o. twice daily for rate control  4) chronic anticoagulation--- in the setting of chronic A. fib, s/p right hip surgery,  -Coumadin therapy as above #3  5)AKI----acute kidney injury on CKD stage -iV   creatinine on admission= 2.07  , baseline creatinine =  1.7 to 1.8   ,  -creatinine is now=  1.3 (renal function appears to be back to baseline) - renally adjust medications, avoid nephrotoxic agents / dehydration  / hypotension --Monitor fluid intake and output and daily weight carefully especially given underlying CKD 4  6)Leukocytosis-- ??  Reactive secondary to hip fracture and flash pulmonary edema -No fevers or other evidence of acute infection at this time Urine and blood cultures- NGTD --no antibiotics indicated at this time  7)Dysphagia/Swallowing concerns--- speech eval appreciated, recommends Dysphagia 2 (fine chop);Honey-thick liquid ---aspiration precautions -With honey thickened liquids patient is at risk for dehydration and readmission -With thin liquids patient is at risk for aspiration and respiratory distress -Family trying to make decisions regarding what they are comfortable with  8)Right  Hip Fracture--- discussed with orthopedic surgeon Dr. Arther Abbott  S/p ORIF on 05/17/2021 (CANNULATED HIP PINNING (Right) - -The postoperative plan is for immediate weightbearing as tolerated Radiographs at 2, 6 and 12 weeks. DVT prophylaxis (patient already on Coumadin for A. Fib) Post op appt in 1 month , subq stitches    9)Hypothyroidism--- continue levothyroxine 88 mcg daily,   TSH 0.935  10)Thrombocytosis --platelets appears to be close to her baseline,  Follows with Dr. Delton Coombes -Continue hydroxyurea.  11) social/ethics--patient is a DNR/DNI plan of care discussed with patient's son [name] and steve at bedside --With honey thickened liquids patient is at risk for dehydration and readmission -With thin liquids patient is at risk for aspiration and respiratory distress -Family trying to make decisions regarding what they are comfortable with -[name] had face-to-face conference with myself, palliative care provider and speech pathologist -Family leaning towards comfort care and hospice final decision yet to be made --Oral intake remains challenging--poor prognosis with poor oral intake  Disposition/Need for in-Hospital Stay- patient unable to be discharged at this time due to --awaiting transfer to SNF rehab Vs residential hospice house  Status is: Inpatient  Remains inpatient appropriate because:Please see disposition above   Disposition: The patient is from: ALF [facility]              Anticipated d/c is to: SNF vs Hospice              Anticipated d/c date is: 2 days              Patient currently is medically stable to d/c. Barriers: Awaiting transfer to SNF rehab Vs Hospice Code Status :  -  Code Status: DNR   Family Communication:   Discussed with son [name] and Richardson Landry and [name]  Consults  :  Ortho/speech  DVT Prophylaxis  :   - SCDs   SCDs Start: 05/17/21 1543 SCDs Start: 05/14/21 2032   Lab Results  Component Value Date   PLT 905 ([place]) 05/20/2021    Inpatient Medications  Scheduled Meds: . acetaminophen  650 mg Rectal QID  . docusate sodium  100 mg Oral BID   Continuous Infusions: . lactated ringers 10 mL/hr at 05/20/21 1507  . methocarbamol (ROBAXIN) IV     PRN Meds:.bisacodyl, magnesium citrate, menthol-cetylpyridinium **OR** phenol, methocarbamol **OR** methocarbamol (ROBAXIN) IV, metoCLOPramide **OR** metoCLOPramide (REGLAN)  injection, morphine injection, ondansetron **OR** ondansetron (ZOFRAN) IV, traMADol    Anti-infectives (From admission, onward)   Start     Dose/Rate Route Frequency Ordered Stop   05/17/21 1930  ceFAZolin (ANCEF) IVPB 2g/100 mL premix        2 g 200 mL/hr over 30 Minutes Intravenous Every 6 hours 05/17/21 1542 05/18/21 0233   05/17/21 0600  ceFAZolin (ANCEF) IVPB 2g/100 mL premix        2 g 200 mL/hr over 30 Minutes Intravenous On call to O.R. 05/16/21 1259 05/17/21 1322   05/15/21 0600  ceFAZolin (ANCEF) IVPB 2g/100 mL premix        2 g 200 mL/hr over 30 Minutes Intravenous On call to O.R. 05/14/21 2315 05/16/21 0559        Objective:   Vitals:   05/19/21 1314 05/19/21 1959 05/20/21 0340 05/20/21 1343  BP: (!) 146/67 116/81 122/60 (!) 157/73  Pulse: 90 85 85 84  Resp: 16 19 18 17   Temp: 98 F (36.7 C) 98.4 F (36.9 C) 98.3 F (36.8 C) 98.9 F (37.2 C)  TempSrc: Oral  Oral  SpO2: 98% 99% 97% 100%    Wt Readings from Last 3 Encounters:  04/17/21 45.2 kg  02/20/21 46.8 kg  01/22/21 46.6 kg     Intake/Output Summary (Last 24 hours) at 05/20/2021 1857 Last data filed at 05/20/2021 1811 Gross per 24 hour  Intake 218.15 ml  Output 450 ml  Net -231.85 ml     Physical Exam  Gen:- Awake Alert,  In no apparent distress  HEENT:- [place].AT, No sclera icterus Neck-Supple Neck,No JVD,.  Lungs-improved air movement, no rales, no wheezing CV- S1, S2 normal, irregularly irregular  abd-  +ve B.Sounds, Abd Soft, No tenderness,    Extremity/Skin:- No  edema, pedal pulses present  Psych -underlying/baseline cognitive and memory deficits  neuro-generalized weakness without additional new focal deficits, no tremors MSK-postop wound intact,, point tenderness on palpation over Rt hip area   Data Review:   Micro Results Recent Results (from the past 240 hour(s))  Resp Panel by RT-PCR (Flu A&B, Covid) Nasopharyngeal Swab     Status: None   Collection Time: 05/14/21  6:24 PM    Specimen: Nasopharyngeal Swab; Nasopharyngeal(NP) swabs in vial transport medium  Result Value Ref Range Status   SARS Coronavirus 2 by RT PCR NEGATIVE NEGATIVE Final    Comment: (NOTE) SARS-CoV-2 target nucleic acids are NOT DETECTED.  The SARS-CoV-2 RNA is generally detectable in upper respiratory specimens during the acute phase of infection. The lowest concentration of SARS-CoV-2 viral copies this assay can detect is 138 copies/mL. A negative result does not preclude SARS-Cov-2 infection and should not be used as the sole basis for treatment or other patient management decisions. A negative result may occur with  improper specimen collection/handling, submission of specimen other than nasopharyngeal swab, presence of viral mutation(s) within the areas targeted by this assay, and inadequate number of viral copies(<138 copies/mL). A negative result must be combined with clinical observations, patient history, and epidemiological information. The expected result is Negative.  Fact Sheet for Patients:  EntrepreneurPulse.com.au  Fact Sheet for Healthcare Providers:  IncredibleEmployment.be  This test is no t yet approved or cleared by the [place] FDA and  has been authorized for detection and/or diagnosis of SARS-CoV-2 by FDA under an Emergency Use Authorization (EUA). This EUA will remain  in effect (meaning this test can be used) for the duration of the COVID-19 declaration under Section 564(b)(1) of the Act, 21 U.S.C.section 360bbb-3(b)(1), unless the authorization is terminated  or revoked sooner.       Influenza A by PCR NEGATIVE NEGATIVE Final   Influenza B by PCR NEGATIVE NEGATIVE Final    Comment: (NOTE) The Xpert Xpress SARS-CoV-2/FLU/RSV plus assay is intended as an aid in the diagnosis of influenza from Nasopharyngeal swab specimens and should not be used as a sole basis for treatment. Nasal washings and aspirates are  unacceptable for Xpert Xpress SARS-CoV-2/FLU/RSV testing.  Fact Sheet for Patients: EntrepreneurPulse.com.au  Fact Sheet for Healthcare Providers: IncredibleEmployment.be  This test is not yet approved or cleared by the [place] FDA and has been authorized for detection and/or diagnosis of SARS-CoV-2 by FDA under an Emergency Use Authorization (EUA). This EUA will remain in effect (meaning this test can be used) for the duration of the COVID-19 declaration under Section 564(b)(1) of the Act, 21 U.S.C. section 360bbb-3(b)(1), unless the authorization is terminated or revoked.  Performed at [facility], [address] [place], [place] [address]   MRSA PCR Screening     Status: None   Collection  Time: 05/14/21 10:58 PM   Specimen: Nasal Mucosa; Nasopharyngeal  Result Value Ref Range Status   MRSA by PCR NEGATIVE NEGATIVE Final    Comment:        The GeneXpert MRSA Assay (FDA approved for NASAL specimens only), is one component of a comprehensive MRSA colonization surveillance program. It is not intended to diagnose MRSA infection nor to guide or monitor treatment for MRSA infections. Performed at [facility], [address], [place], [place] [address]   Urine Culture     Status: Abnormal   Collection Time: 05/15/21  6:01 PM   Specimen: Urine, Catheterized  Result Value Ref Range Status   Specimen Description   Final    URINE, CATHETERIZED Performed at [facility], [address] [place], [place] [address]    Special Requests   Final    Normal Performed at [facility], [address] [place], [place] [address]    Culture MULTIPLE SPECIES PRESENT, SUGGEST RECOLLECTION (A)  Final   Report Status 05/17/2021 FINAL  Final  Culture, blood (Routine X 2) w Reflex to ID Panel     Status: None   Collection Time: 05/15/21 11:26 PM   Specimen: BLOOD RIGHT FOREARM  Result Value Ref Range Status   Specimen Description BLOOD RIGHT FOREARM   Final   Special Requests   Final    BOTTLES DRAWN AEROBIC AND ANAEROBIC Blood Culture adequate volume   Culture   Final    NO GROWTH 5 DAYS Performed at [facility], [address], [place], [place] [address]    Report Status 05/20/2021 FINAL  Final  Culture, blood (Routine X 2) w Reflex to ID Panel     Status: None   Collection Time: 05/15/21 11:29 PM   Specimen: Right Antecubital; Blood  Result Value Ref Range Status   Specimen Description RIGHT ANTECUBITAL  Final   Special Requests   Final    BOTTLES DRAWN AEROBIC AND ANAEROBIC Blood Culture adequate volume   Culture   Final    NO GROWTH 5 DAYS Performed at [facility], [address], [place], [place] [address]    Report Status 05/20/2021 FINAL  Final  Surgical PCR screen     Status: None   Collection Time: 05/17/21  5:51 AM   Specimen: Nasal Mucosa; Nasal Swab  Result Value Ref Range Status   MRSA, PCR NEGATIVE NEGATIVE Final   Staphylococcus aureus NEGATIVE NEGATIVE Final    Comment: (NOTE) The Xpert SA Assay (FDA approved for NASAL specimens in patients 74 years of age and older), is one component of a comprehensive surveillance program. It is not intended to diagnose infection nor to guide or monitor treatment. Performed at [facility], [address], [place], [place] [address]     Radiology Reports DG Chest 1 View  Result Date: 05/14/2021 CLINICAL DATA:  Fall, hip pain EXAM: CHEST  1 VIEW COMPARISON:  11/12/2018 FINDINGS: Heart is borderline in size. Peribronchial thickening, vascular congestion and interstitial prominence. This could reflect interstitial edema. No confluent opacities or effusions. IMPRESSION: Bronchitic changes. Suspect mild vascular congestion and interstitial edema. Electronically Signed   By: Rolm Baptise M.D.   On: 05/14/2021 18:19   CT HEAD WO CONTRAST  Result Date: 05/14/2021 CLINICAL DATA:  87 year old female with encephalopathy. EXAM: CT HEAD WITHOUT CONTRAST TECHNIQUE: Contiguous axial  images were obtained from the base of the skull through the vertex without intravenous contrast. COMPARISON:  Head CT dated 05/14/2021. FINDINGS: Brain: Moderate age-related atrophy and chronic  microvascular ischemic changes. There is no acute intracranial hemorrhage. No mass effect or midline shift. No extra-axial fluid collection. Vascular: No hyperdense vessel or unexpected calcification. Skull: Normal. Negative for fracture or focal lesion. Sinuses/Orbits: Right maxillary sinus retention cyst or polyp. The remainder of the visualized paranasal sinuses and mastoid air cells are clear. Other: None IMPRESSION: 1. No acute intracranial pathology. 2. Moderate age-related atrophy and chronic microvascular ischemic changes. Electronically Signed   By: Anner Crete M.D.   On: 05/14/2021 23:01   CT Head Wo Contrast  Result Date: 05/14/2021 CLINICAL DATA:  Status post fall out of bed.  Facial laceration. EXAM: CT HEAD WITHOUT CONTRAST CT CERVICAL SPINE WITHOUT CONTRAST TECHNIQUE: Multidetector CT imaging of the head and cervical spine was performed following the standard protocol without intravenous contrast. Multiplanar CT image reconstructions of the cervical spine were also generated. COMPARISON:  Head CT 09/12/2017.  CT neck 10/10/2016. FINDINGS: CT HEAD FINDINGS Brain: No evidence of acute infarction, hemorrhage, hydrocephalus, extra-axial collection or mass lesion/mass effect. Vascular: No hyperdense vessel or unexpected calcification. Skull: Intact.  No focal lesion. Sinuses/Orbits: Wall and mucosal thickening in the right maxillary sinus consistent with chronic sinus disease appear unchanged. Mild ethmoid air cell disease on left noted. Other: None. CT CERVICAL SPINE FINDINGS Alignment: Normal. Skull base and vertebrae: No acute fracture. No primary bone lesion or focal pathologic process. Soft tissues and spinal canal: No prevertebral fluid or swelling. No visible canal hematoma. Disc levels: Mild  multilevel loss of disc space height and facet arthropathy noted. Upper chest: There is interlobular septal thickening and ground-glass attenuation in the lung apices. Other: None. IMPRESSION: No acute abnormality head or cervical spine. Appearance of the lung apices suggestive of interstitial edema. Electronically Signed   By: Inge Rise M.D.   On: 05/14/2021 17:54   CT Cervical Spine Wo Contrast  Result Date: 05/14/2021 CLINICAL DATA:  Status post fall out of bed.  Facial laceration. EXAM: CT HEAD WITHOUT CONTRAST CT CERVICAL SPINE WITHOUT CONTRAST TECHNIQUE: Multidetector CT imaging of the head and cervical spine was performed following the standard protocol without intravenous contrast. Multiplanar CT image reconstructions of the cervical spine were also generated. COMPARISON:  Head CT 09/12/2017.  CT neck 10/10/2016. FINDINGS: CT HEAD FINDINGS Brain: No evidence of acute infarction, hemorrhage, hydrocephalus, extra-axial collection or mass lesion/mass effect. Vascular: No hyperdense vessel or unexpected calcification. Skull: Intact.  No focal lesion. Sinuses/Orbits: Wall and mucosal thickening in the right maxillary sinus consistent with chronic sinus disease appear unchanged. Mild ethmoid air cell disease on left noted. Other: None. CT CERVICAL SPINE FINDINGS Alignment: Normal. Skull base and vertebrae: No acute fracture. No primary bone lesion or focal pathologic process. Soft tissues and spinal canal: No prevertebral fluid or swelling. No visible canal hematoma. Disc levels: Mild multilevel loss of disc space height and facet arthropathy noted. Upper chest: There is interlobular septal thickening and ground-glass attenuation in the lung apices. Other: None. IMPRESSION: No acute abnormality head or cervical spine. Appearance of the lung apices suggestive of interstitial edema. Electronically Signed   By: Inge Rise M.D.   On: 05/14/2021 17:54   DG CHEST PORT 1 VIEW  Result Date:  05/16/2021 CLINICAL DATA:  87 year old female with shortness of breath. EXAM: PORTABLE CHEST 1 VIEW COMPARISON:  Portable chest 05/14/2021 and earlier. FINDINGS: Portable AP semi upright view at 0521 hours. Larger lung volumes and decreased bilateral pulmonary interstitial opacity. Lung markings appear to be near baseline now. Stable cardiac size and mediastinal  contours. No definite cardiomegaly. Visualized tracheal air column is within normal limits. No pneumothorax or pleural effusion. Sequelae of thoracolumbar junction region vertebral plasty. Stable visualized osseous structures. Paucity of bowel gas in the upper abdomen. IMPRESSION: Improved lung volumes and ventilation since 05/14/2020 with resolving edema or less likely viral/atypical infection. Electronically Signed   By: Genevie Ann M.D.   On: 05/16/2021 06:34   DG CHEST PORT 1 VIEW  Result Date: 05/14/2021 CLINICAL DATA:  Unresponsive EXAM: PORTABLE CHEST 1 VIEW COMPARISON:  05/14/2021 FINDINGS: Cardiac shadow is stable. Aortic calcifications are again seen. Lungs are well aerated bilaterally. Increasing interstitial edema is noted. No confluent infiltrate or effusion is noted. Changes of prior vertebral augmentation are again seen. IMPRESSION: Increasing pulmonary edema. Electronically Signed   By: Inez Catalina M.D.   On: 05/14/2021 23:18   ECHOCARDIOGRAM COMPLETE  Result Date: 05/15/2021    ECHOCARDIOGRAM REPORT   Patient Name:   KEYUANA WANK Date of Exam: 05/15/2021 Medical Rec #:  [id number]    Height:       63.0 in Accession #:    [id number]   Weight:       99.6 lb Date of Birth:  07-24-1923     BSA:          1.438 m Patient Age:    5 years     BP:           150/71 mmHg Patient Gender: F            HR:           86 bpm. Exam Location:  [facility] Procedure: 2D Echo, Cardiac Doppler and Color Doppler Indications:    CHF  History:        Patient has prior history of Echocardiogram examinations, most                 recent 09/13/2017. Mitral  regurgitation, Arrythmias:Atrial                 Fibrillation, Signs/Symptoms:Altered Mental Status, Dyspnea and                 Pulmonary edema; Risk Factors:Hypertension and Dyslipidemia.                 Right hip fracture.  Sonographer:    Dustin Flock RDCS Referring Phys: [phone] [facility]  Sonographer Comments: No apical window and no subcostal window. Right hip fracture and very thin body habitus. IMPRESSIONS  1. Left ventricular ejection fraction, by estimation, is 60 to 65%. The left ventricle has normal function. Left ventricular endocardial border not optimally defined to evaluate regional wall motion. Left ventricular diastolic parameters are indeterminate.  2. Right ventricular systolic function was not well visualized. The right ventricular size is not well visualized. There is normal pulmonary artery systolic pressure.  3. The mitral valve was not well visualized. Mild mitral valve regurgitation. No evidence of mitral stenosis.  4. The aortic valve is tricuspid. Aortic valve regurgitation is not visualized. No aortic stenosis is present.  5. Limited views obtaine due to limitations on patient positioning. FINDINGS  Left Ventricle: Left ventricular ejection fraction, by estimation, is 60 to 65%. The left ventricle has normal function. Left ventricular endocardial border not optimally defined to evaluate regional wall motion. The left ventricular internal cavity size was normal in size. There is no left ventricular hypertrophy. Left ventricular diastolic parameters are indeterminate. Right Ventricle: The right ventricular size is not well visualized. Right vetricular  wall thickness was not well visualized. Right ventricular systolic function was not well visualized. There is normal pulmonary artery systolic pressure. The tricuspid regurgitant velocity is 1.82 m/s, and with an assumed right atrial pressure of 3 mmHg, the estimated right ventricular systolic pressure is 40.9 mmHg. Left Atrium:  Left atrial size was not well visualized. Right Atrium: Right atrial size was not well visualized. Pericardium: There is no evidence of pericardial effusion. Mitral Valve: The mitral valve was not well visualized. Mild mitral valve regurgitation. No evidence of mitral valve stenosis. Tricuspid Valve: The tricuspid valve is not well visualized. Tricuspid valve regurgitation is mild . No evidence of tricuspid stenosis. Aortic Valve: The aortic valve is tricuspid. Aortic valve regurgitation is not visualized. No aortic stenosis is present. Pulmonic Valve: The pulmonic valve was not well visualized. Pulmonic valve regurgitation is not visualized. No evidence of pulmonic stenosis. Aorta: The aortic root is normal in size and structure. Pulmonary Artery: Indeterminant PASP, inadequate TR jet. IAS/Shunts: The interatrial septum was not well visualized.  LEFT VENTRICLE PLAX 2D LVIDd:         3.23 cm LVIDs:         2.14 cm LV PW:         0.89 cm LV IVS:        0.89 cm LVOT diam:     1.90 cm LVOT Area:     2.84 cm  LEFT ATRIUM         Index LA diam:    3.80 cm 2.64 cm/m   AORTA Ao Root diam: 2.30 cm TRICUSPID VALVE TR Peak grad:   13.2 mmHg TR Vmax:        182.00 cm/s  SHUNTS Systemic Diam: 1.90 cm Carlyle Dolly MD Electronically signed by Carlyle Dolly MD Signature Date/Time: 05/15/2021/4:09:43 PM    Final    DG HIP OPERATIVE UNILAT WITH PELVIS RIGHT  Result Date: 05/17/2021 CLINICAL DATA:  Pinning of RIGHT hip EXAM: OPERATIVE RIGHT HIP (WITH PELVIS IF PERFORMED) 9 VIEWS TECHNIQUE: Fluoroscopic spot image(s) were submitted for interpretation post-operatively. COMPARISON:  05/14/2021 FLUOROSCOPY TIME:  1 minutes 13 seconds FINDINGS: Osseous demineralization. Images demonstrate placement of 3 cannulated screws across an impacted subcapital fracture of the RIGHT femoral neck. No dislocation. IMPRESSION: Post pinning of RIGHT femoral neck fracture. Electronically Signed   By: Lavonia Dana M.D.   On: 05/17/2021 15:30   DG  Hip Unilat With Pelvis 2-3 Views Right  Result Date: 05/14/2021 CLINICAL DATA:  Fall, right hip pain EXAM: DG HIP (WITH OR WITHOUT PELVIS) 2-3V RIGHT COMPARISON:  07/18/2012 FINDINGS: There is a right femoral neck fracture noted. No subluxation or dislocation. Slight impaction. Early degenerative changes in the hips bilaterally. IMPRESSION: Right femoral neck fracture. Electronically Signed   By: Rolm Baptise M.D.   On: 05/14/2021 18:16     CBC Recent Labs  Lab 05/14/21 1721 05/14/21 2259 05/15/21 0440 05/16/21 0551 05/17/21 0623 05/18/21 0639 05/19/21 0443 05/20/21 0526  WBC 9.7 18.8*   < > 12.1* 13.0* 11.3* 16.5* 13.4*  HGB 11.5* 12.2   < > 11.4* 12.0 10.9* 10.1* 10.1*  HCT 35.3* 37.9   < > 34.9* 36.9 34.3* 31.8* 32.2*  PLT 865* 851*   < > 744* 866* 727* 752* 905*  MCV 102.3* 101.3*   < > 100.3* 102.2* 103.3* 101.9* 104.5*  MCH 33.3 32.6   < > 32.8 33.2 32.8 32.4 32.8  MCHC 32.6 32.2   < > 32.7 32.5 31.8 31.8 31.4  RDW 17.1* 16.9*   < > 16.9* 16.8* 16.4* 16.4* 17.1*  LYMPHSABS 1.6 2.0  --   --   --   --   --   --   MONOABS 0.8 1.2*  --   --   --   --   --   --   EOSABS 0.4 0.2  --   --   --   --   --   --   BASOSABS 0.0 0.1  --   --   --   --   --   --    < > = values in this interval not displayed.    Chemistries  Recent Labs  Lab 05/16/21 0551 05/17/21 0623 05/18/21 0639 05/19/21 0443 05/20/21 0526  NA 140 140 142 142 148*  K 4.0 3.9 3.3* 3.8 3.9  CL 102 100 106 109 111  CO2 26 27 27 26 27   GLUCOSE 117* 120* 102* 139* 82  BUN 47* 53* 48* 46* 50*  CREATININE 1.63* 1.57* 1.34* 1.26* 1.35*  CALCIUM 9.0 9.4 8.3* 8.6* 9.0   ------------------------------------------------------------------------------------------------------------------ No results for input(s): CHOL, HDL, LDLCALC, TRIG, CHOLHDL, LDLDIRECT in the last 72 hours.  Lab Results  Component Value Date   HGBA1C 5.7 (H) 09/13/2017    ------------------------------------------------------------------------------------------------------------------ No results for input(s): TSH, T4TOTAL, T3FREE, THYROIDAB in the last 72 hours.  Invalid input(s): FREET3 ------------------------------------------------------------------------------------------------------------------ No results for input(s): VITAMINB12, FOLATE, FERRITIN, TIBC, IRON, RETICCTPCT in the last 72 hours.  Coagulation profile Recent Labs  Lab 05/17/21 0623 05/17/21 1200 05/18/21 0639 05/19/21 0443 05/20/21 0526  INR 1.6* 1.4* 1.2 1.4* 1.8*    No results for input(s): DDIMER in the last 72 hours.  Cardiac Enzymes No results for input(s): CKMB, TROPONINI, MYOGLOBIN in the last 168 hours.  Invalid input(s): CK ------------------------------------------------------------------------------------------------------------------    Component Value Date/Time   BNP 238.0 (H) 09/12/2017 2263     Roxan Hockey M.D on 05/20/2021 at 6:57 PM  Go to [email or web address] - for contact info  [facility] - Office  [phone]

## 2021-05-20 NOTE — Progress Notes (Signed)
Palliative:  HPI: 87 y.o. female  with past medical history of A. fib, mitral regurgitation, HTN, HLD, hypothyroid, T12 compression fracture with kyphoplasty 2019, anxiety/depression, OA, GERD admitted on 05/14/2021 with closed right hip fracture with planned repair for Friday 6/3.   I met today with Ms. Rostad's son Richardson Landry along with Dr. Denton Brick and joined by Chrystie Nose SLP. Discussed with Richardson Landry his mother's severe dysphagia and poor intake. Discussed likelihood of ongoing poor intake as well as risk of aspiration leading towards end of life in the coming days to weeks. 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 He understands the decisions to be made and would like for his mother to be able to eat/drink what she would like recognizing that she will likely have very little intake. We discussed concern with expected decline and need for comfort medications and this is not something that could be provided at [facility] - she is too ill. We discussed the options of hospice facility in providing comfort measures at end of life and also discussed rehab but with poor intake she is unlikely to benefit or even be able to participate in rehab. Richardson Landry understands and notes that his brother Octavia Bruckner felt that hospice would be recommended. Richardson Landry plans to discuss further with his brothers. Remo Lipps also would like to speak with his mother about this as well.   All questions/concerns addressed. Emotional support provided.   Plan: - Family discussing potential plan for comfort and hospice placement. No firm decisions made at this point. I will continue to follow and assist with conversations to help them with plan to move forward.   Parrish, NP Palliative Medicine Team Pager [phone] (Please see [email or web address] for schedule) Team Phone [phone]    Greater than 50%  of this time was spent counseling and coordinating care related to the above  assessment and plan

## 2021-05-20 NOTE — Progress Notes (Signed)
Patient requested that silver ring go home with family. Placed ring in bag and son took home.

## 2021-05-20 NOTE — Progress Notes (Signed)
Dressing to Right Hip with what appeared to be blood pooled under dressing. Mepilex removed with large blood clot along incision line. Clot removed and pressure dressing applied with 4x4's and ABD pad with tape. Will continue to monitor.

## 2021-05-20 NOTE — Evaluation (Signed)
Modified Barium Swallow Progress Note  Patient Details  Name: Leslie Rosario MRN: [id number] Date of Birth: 1923-10-30  Today's Date: 05/20/2021  Modified Barium Swallow completed.  Full report located under Chart Review in the Imaging Section.  Brief recommendations include the following:  Clinical Impression  Pt presents with severe oropharyngeal dysphagia characterized by aspiration (majority silent some sensed) of all liquids presented (thin, NTL and HTL). Episodes of aspiration ranged from trace amounts to moderate amounts and sensation did not seem based on volume. With puree trials note severe pharyngeal residue; additional presentations of puree facilitated clearance of initial bolus however, moderate amounts of residue remained in the valleculae and pyriforms across trials. With cues to swallow hard, multiple swallows and HTL by tsp residue decreased to minimal. Only one episode of aspiration of puree residue was visualized and this episode was silent. Pt presents with poor laryngeal vestibule closure, decreased pharyngeal squeeze, decreased base of tongue retraction, and decreased epiglottic deflection. Pt was unable to follow instructions to complete swallowing maneuvers and cues to swallow "hard" were minimally effective. After the pharynx was coated with puree residue and tsp presentations of HTL were provided no aspiration of HTL was noted. Least restrictive diet recommendation is HTL (by TEASPOON only) and Puree textures. Pt is at very high risk for malnutrition and dehydration in addition to still being at risk for aspiration on this recommended diet. ST will continue to follow for goals of care, further recommendations and support, thank you.   Swallow Evaluation Recommendations       SLP Diet Recommendations: Dysphagia 1 (Puree) solids;Honey thick liquids (HTL by tsp)   Liquid Administration via: Spoon   Medication Administration: Crushed with puree   Supervision: Full  supervision/cueing for compensatory strategies;Staff to assist with self feeding   Compensations: Slow rate;Small sips/bites;Clear throat intermittently;Follow solids with liquid   Postural Changes: Seated upright at 90 degrees;Remain semi-upright after after feeds/meals (Comment)   Oral Care Recommendations: Oral care BID   Other Recommendations: Order thickener from pharmacy;Remove water pitcher;Clarify dietary restrictions  Sherryl Valido H. Roddie Mc, CCC-SLP Speech Language Pathologist   [name] 05/20/2021,11:32 AM

## 2021-05-20 NOTE — Progress Notes (Signed)
  Speech Language Pathology Treatment: Dysphagia  Patient Details Name: Leslie Rosario MRN: [id number] DOB: [birth year] Today's Date: 05/20/2021 Time: 5027-7412 SLP Time Calculation (min) (ACUTE ONLY): 23.67 min  Assessment / Plan / Recommendation Clinical Impression  Ongoing diagnostic dysphagia therapy provided this am targeting PO trials; Pt was alert and when asked how she is reports she is "bad". SLP completed treatment on 6/4 and recommended D2 and HTL however Pt did not seem to tolerate PO as the day went on and ? Mentation decline per LPN who reported concerns and noted wet vocal quality and expectoration of PO when provided. LPN contacted this SLP via phone who recommended Pt be made NPO pending MBSS, fluctuating mentation was a further concern. This am SLP provided ice chips and noted multiple wet/audible swallows and wet vocal quality and coughing following the swallows. Pt did consume 2 tsp bites of pudding with multiple swallows that were audible and some wetness noted after these trials. Recommend continue NPO proceed with MBSS later this am. Thank you,   HPI HPI: 87 y.o. female with medical history significant for atrial fibrillation on coumadin, mitral regurgitation, HTN admitted on 05/14/2021 with Right Hip Fx after a mechanical fall, subsequently after 10 PM on 05/14/2020 developed unresponsive episode presumably after getting fentanyl in the ED previously, was found to be in flash pulmonary edema responded well to IV Lasix. Dr. Aline Brochure plan is for ORIF on 05/17/21 after further medical stabilization. RN reports "choking" episode this am with water. BSE requested      SLP Plan  Continue with current plan of care       Recommendations  Diet recommendations: NPO                Oral Care Recommendations: Oral care BID Follow up Recommendations: 24 hour supervision/assistance SLP Visit Diagnosis: Dysphagia, unspecified (R13.10) Plan: Continue with current plan of care        Mildred Tuccillo H. Roddie Mc, CCC-SLP Speech Language Pathologist       [name] 05/20/2021, 9:46 AM

## 2021-05-21 LAB — PROTIME-INR
INR: 2.1 — ABNORMAL HIGH (ref 0.8–1.2)
Prothrombin Time: 23.9 seconds — ABNORMAL HIGH (ref 11.4–15.2)

## 2021-05-21 LAB — BASIC METABOLIC PANEL
Anion gap: 8 (ref 5–15)
BUN: 50 mg/dL — ABNORMAL HIGH (ref 8–23)
CO2: 27 mmol/L (ref 22–32)
Calcium: 9.1 mg/dL (ref 8.9–10.3)
Chloride: 114 mmol/L — ABNORMAL HIGH (ref 98–111)
Creatinine, Ser: 1.18 mg/dL — ABNORMAL HIGH (ref 0.44–1.00)
GFR, Estimated: 42 mL/min — ABNORMAL LOW (ref >60)
Glucose, Bld: 87 mg/dL (ref 70–99)
Potassium: 3.6 mmol/L (ref 3.5–5.1)
Sodium: 149 mmol/L — ABNORMAL HIGH (ref 135–145)

## 2021-05-21 LAB — CBC
HCT: 31.3 % — ABNORMAL LOW (ref 36.0–46.0)
Hemoglobin: 9.8 g/dL — ABNORMAL LOW (ref 12.0–15.0)
MCH: 33 pg (ref 26.0–34.0)
MCHC: 31.3 g/dL (ref 30.0–36.0)
MCV: 105.4 fL — ABNORMAL HIGH (ref 80.0–100.0)
Platelets: 1151 10*3/uL (ref 150–400)
RBC: 2.97 MIL/uL — ABNORMAL LOW (ref 3.87–5.11)
RDW: 16.9 % — ABNORMAL HIGH (ref 11.5–15.5)
WBC: 13.3 10*3/uL — ABNORMAL HIGH (ref 4.0–10.5)
nRBC: 0 % (ref 0.0–0.2)

## 2021-05-21 NOTE — Progress Notes (Signed)
Palliative:  HPI: 87 y.o.femalewith past medical history of A. fib, mitral regurgitation, HTN, HLD, hypothyroid, T12 compression fracture with kyphoplasty 2019, anxiety/depression, OA, GERDadmitted on 5/31/2022with closed right hip fracture with planned repair for Friday 6/3.   I met today with Ms. Newman's son, Richardson Landry, while the nurse works with Ms. Sustaita. Richardson Landry talks with me about his mother eating 2 portions of ice cream today and a couple spoonfuls of thickened water. He talks of her "phlegm" from dairy and sinus trouble. I discussed with him further about concern for phlegm being from swallowing dysfunction. They continue to struggle with severity of medical issues but acknowledging that she is likely not to live much longer. Richardson Landry expresses hope that she can return to [facility] if she wishes to and he plans to speak with Nanine Means to see if this would be an option. We did discuss having hospice to assist at [facility] and this would give his mother and the staff at [facility] more support. They could also provide her a hospital bed. Richardson Landry plans to discuss more with staff at [facility] and his mother to better decide how to move forward.   Discussed with SLP Clyde Canterbury who recommends dysphagia 1, honey thick diet for comfort and ice chips with good oral care. No further needs from SLP. Family have decided to allow liberalized diet as desired with acceptance of risk. Will allow for comfort feeds from floor stock. Would recommend conservative bites/sips as desired. Overall likely to have discomfort from other textures or thin liquids.   All questions/concerns addressed. Emotional support provided. Discussed with Dr. Denton Brick and SLP Clyde Canterbury.   Plan: - Patient and family still discussing options moving forward.  - May offer comfort food as desired.   25 min  Vinie Sill, NP Palliative Medicine Team Pager [phone] (Please see [email or web address] for schedule) Team Phone [phone]    Greater than 50%   of this time was spent counseling and coordinating care related to the above assessment and plan

## 2021-05-21 NOTE — TOC Progression Note (Signed)
Transition of Care [facility]) - Progression Note    Patient Details  Name: Leslie Rosario MRN: [id number] Date of Birth: [birth year]  Transition of Care [facility]) CM/SW Contact  Boneta Lucks, RN Phone Number: 05/21/2021, 2:42 PM  Clinical Narrative:   Palliative consult complete, family is agreeing to hospice. Family is wanting patient to return to [facility].  TOC spoke with Lovey Newcomer RN at [facility], after reviewing, they will accept patient back and requested [place].  TOC referred to [facility] at [employer]. Planning for patient to return to [facility] tomorrow.     Expected Discharge Plan: [place] ([facility]) Barriers to Discharge: Continued Medical Work up  Expected Discharge Plan and Services Expected Discharge Plan: [facility])      Readmission Risk Interventions Readmission Risk Prevention Plan 05/21/2021 05/16/2021  Transportation Screening - Complete  PCP or Specialist Appt within 3-5 Days Complete -  HRI or [place] - Complete  Social Work Consult for [place] Planning/Counseling - Complete  Palliative Care Screening - Not Applicable  Medication Review [employer]) - Complete  Some recent data might be hidden

## 2021-05-21 NOTE — Progress Notes (Signed)
[facility]) Hospital Liaison RN note Received request from [facility] for hospice services at [place] after discharge. Chart and patient information under review by Hospice physician. Hospice eligibility pending at this time.  Spoke with son, Richardson Landry to initiate education related to hospice philosophy, services, and team approach to care. Patient/family verbalized understanding of information given. Per discussion, the plan is for discharge back to [facility] by ambulance on 05/21/21.  DME needs discussed. Patient has the following equipment in the facility: wheelchair and walker. Patient/family requests the following equipment for deliver: hospital bed. DME company will reach out to facility to arrange delivery.   Please send signed and completed DNR with patient/family. Please provide symptoms at discharge as needed for ongoing symptom management.  [facility] information and contact numbers given to [place]. Above information shared with Wells Guiles.   Please call with any hospice related questions or concerns.   Thank you for the opportunity to participate in this patient's care.  Jhonnie Garner, Therapist, sports, BSN [employer] [phone]

## 2021-05-21 NOTE — Progress Notes (Signed)
Physical Therapy Treatment Patient Details Name: Leslie Rosario MRN: [id number] DOB: 1923-09-15 Today's Date: 05/21/2021    History of Present Illness 87 y.o. female with medical history significant for atrial fibrillation on coumadin, mitral regurgitation, HTN admitted on 05/14/2021 with Right Hip Fx after a mechanical fall, subsequently after 10 PM on 05/14/2020 developed unresponsive episode presumably after getting fentanyl in the ED previously, was found to be in flash pulmonary edema responded well to IV Lasix. Dr. Aline Brochure plan is for ORIF on 05/17/21 after further medical stabilization.  S/p R hip pinning, WBAT.    PT Comments    Pt with son in room and willing to participate with therapy today.  Pt required mod A and cueing to assist with bed mobility with verbal and tactile cueing.  Decreased sitting balance with tendency to lean Rt and posterior.  Max A with standing for short duration and need to sit per fatigue.  EOS pt left in bed with bed alarm set, call bell within reach and son in room.  No reports of pain through session.      Follow Up Recommendations  SNF     Equipment Recommendations  None recommended by PT    Recommendations for Other Services       Precautions / Restrictions      Mobility  Bed Mobility Overal bed mobility: Needs Assistance Bed Mobility: Supine to Sit;Sit to Supine     Supine to sit: Mod assist Sit to supine: Mod assist   General bed mobility comments: slow and labored, decreased active motion of LEs, R worse than L.    Transfers   Equipment used: Rolling walker (2 wheeled)             General transfer comment: sit<>Stand MaxA.  Ambulation/Gait                 Stairs             Wheelchair Mobility    Modified Rankin (Stroke Patients Only)       Balance                                            Cognition                                              Exercises General  Exercises - Lower Extremity Quad Sets: AROM;Strengthening;Both;5 reps Heel Slides: Both;10 reps Straight Leg Raises: Both;5 reps    General Comments        Pertinent Vitals/Pain      Home Living                      Prior Function            PT Goals (current goals can now be found in the care plan section)      Frequency    Min 3X/week      PT Plan      Co-evaluation              AM-PAC PT "6 Clicks" Mobility   Outcome Measure  Help needed turning from your back to your side while in a flat bed without using bedrails?: A Lot Help needed moving from lying on  your back to sitting on the side of a flat bed without using bedrails?: A Lot Help needed moving to and from a bed to a chair (including a wheelchair)?: A Lot Help needed standing up from a chair using your arms (e.g., wheelchair or bedside chair)?: Total Help needed to walk in hospital room?: Total Help needed climbing 3-5 steps with a railing? : Total 6 Click Score: 9    End of Session   Activity Tolerance: Patient tolerated treatment well;Patient limited by fatigue Patient left: in bed;with call bell/phone within reach;with family/visitor present;with SCD's reapplied Nurse Communication: Mobility status PT Visit Diagnosis: Unsteadiness on feet (R26.81);Other abnormalities of gait and mobility (R26.89);Repeated falls (R29.6);Muscle weakness (generalized) (M62.81)     Time: 1445-1510 PT Time Calculation (min) (ACUTE ONLY): 25 min  Charges:  $Therapeutic Exercise: 8-22 mins $Therapeutic Activity: 8-22 mins           Ihor Austin, LPTA/CLT; CBIS [phone]   [name] 05/21/2021, 5:23 PM

## 2021-05-21 NOTE — Progress Notes (Signed)
SLP Cancellation Note  Patient Details Name: Leslie Rosario MRN: [id number] DOB: December 12, 1923   Cancelled treatment:        There are no further ST needs noted at this time; SLP communicated with NP with Palliative Care who reports family has requested comfort feeds as they want her to have whatever she wants and that they are likely pursuing hospice placement. Least restrictive/comfortable diet will be initiated with comfort feeds to continue. Defer diet placement and further care to palliative/hospice. Please re-consult if needed, thank you for allowing [name] to care for this patient,  Almetta Lovely. Roddie Mc, CCC-SLP Speech Language Pathologist    [name] 05/21/2021, 11:33 AM

## 2021-05-21 NOTE — Progress Notes (Signed)
Patient Demographics:    Leslie Rosario, is a 87 y.o. female, DOB - 09-Jan-1923, EXH:[id number]  Admit date - 05/14/2021   Admitting Physician Ejiroghene Arlyce Dice, MD  Outpatient Primary MD for the patient is Celene Squibb, MD  LOS - 7   Chief Complaint  Patient presents with  . Fall        Subjective:    Leslie Rosario today has no fevers, no emesis,  No chest pain,   Son Richardson Landry at bedside -Patient ate a bit of ice cream but overall oral intake is pretty poor  Assessment  & Plan :    Principal Problem:   Closed right hip fracture ([place]) Active Problems:   Atrial fibrillation ([place])   Long term current use of anticoagulant therapy   Acute metabolic encephalopathy   Dysphagia   Hypothyroidism   Essential hypertension   AMS (altered mental status)  Brief Summary:- 87 y.o. female with medical history significant for atrial fibrillation on coumadin, mitral regurgitation, HTN admitted on 05/14/2021 with Right Hip Fx after a mechanical fall, subsequently after 10 PM on 05/14/2020 developed unresponsive episode presumably after getting fentanyl in the ED previously, was found to be in flash pulmonary edema responded well to IV Lasix   A/p 1)FEN/Dysphagia/Swallowing concerns--- speech eval appreciated, recommends Dysphagia 2 (fine chop);Honey-thick liquid ---aspiration precautions -With honey thickened liquids patient is at risk for dehydration and readmission -With thin liquids patient is at risk for aspiration and respiratory distress -Oral intake remains pretty poor -Palliative care input appreciated -Speech pathology consult appreciated -Family would like patient to go back to [facility] assisted [facility] [place]  hospice team following -May Need to transfer to residential hospice down the road if continues to decline  2)HFpEF--- patient appears to have chronic diastolic dysfunction CHF,  --echo from  09/13/2017 with EF of 60 to 65%, on 05/14/2021 patient had "flash pulmonary edema" after 10 PM after receiving IV fentanyl previously in the ED -- please see chest x-ray report, she responded well to IV Lasix -Continue Lasix -Updated echo requested -Repeat chest x-ray on 05/16/2021 shows resolving edema -Monitor fluid intake and output and daily weight carefully especially given underlying CKD 4  3)Chronic Atrial Fibrillation--hold Coumadin due to right hip bleeding -- Continue metoprolol 25 mg p.o. twice daily for rate control  4)Chronic Anticoagulation--- in the setting of chronic A. fib, s/p right hip surgery,  -Coumadin therapy as above #3  5)AKI----acute kidney injury on CKD stage -iV   creatinine on admission= 2.07  , baseline creatinine =  1.7 to 1.8   ,  -creatinine is now=  1.3 (renal function appears to be back to baseline) - renally adjust medications, avoid nephrotoxic agents / dehydration  / hypotension --Monitor fluid intake and output and daily weight carefully especially given underlying CKD 4  6)Leukocytosis-- ??  Reactive secondary to hip fracture and flash pulmonary edema -No fevers or other evidence of acute infection at this time Urine and blood cultures- NGTD --no antibiotics indicated at this time  7)Dysphagia/Swallowing concerns--- speech eval appreciated, recommends Dysphagia 2 (fine chop);Honey-thick liquid ---aspiration precautions -With honey thickened liquids patient is at risk for dehydration and readmission -With thin liquids patient is at risk for aspiration and respiratory distress -Family trying to  make decisions regarding what they are comfortable with  8)Right Hip Fracture--- discussed with orthopedic surgeon Dr. Arther Abbott  S/p ORIF on 05/17/2021 (CANNULATED HIP PINNING (Right) - -The postoperative plan is for immediate weightbearing as tolerated Radiographs at 2, 6 and 12 weeks. DVT prophylaxis (patient was already on Coumadin for A.  Fib) Post op appt in 1 month , subq stitches  --Family would like patient to go back to [facility] assisted living [place] team following -May Need to transfer to residential hospice down the road if continues to decline -We will hold Coumadin  9)Hypothyroidism--- continue levothyroxine 88 mcg daily,  TSH 0.935  10)Thrombocytosis --platelets appears to be close to her baseline,  Follows with Dr. Delton Coombes May discontinue hydroxyurea at discharge  11)Social/Ethics--patient is a DNR/DNI plan of care discussed with patient's son [name] and steve at bedside --With honey thickened liquids patient is at risk for dehydration and readmission -With thin liquids patient is at risk for aspiration and respiratory distress -Family trying to make decisions regarding what they are comfortable with -on 05/20/2021--[name] had face-to-face conference with myself, palliative care provider and speech pathologist -Family leaning towards comfort care and hospice final decision yet to be made --Oral intake remains challenging--poor prognosis with poor oral intake -Plan is to discharge back to [place] with [facility] hospice services -Patient having difficulty swallowing medications, hydroxyurea and Coumadin and other oral medications will have to be probably discontinued given transition to hospice   Disposition/Need for in-Hospital Stay- patient unable to be discharged at this time due to ---Family would like patient to go back to [facility] assisted living [place]  hospice team following -May Need to transfer to residential hospice down the road if continues to decline Status is: Inpatient  Remains inpatient appropriate because:Please see disposition above   Disposition: The patient is from: ALF [facility]              Anticipated d/c is to: -ALF with hospice following              Anticipated d/c date is: 1 day              Patient currently is medically stable to  d/c. Barriers: Awaiting transfer to ALF with hospice Code Status :  -  Code Status: DNR   Family Communication:   Discussed with son [name] and Richardson Landry and [name]  Consults  :  Ortho/speech/palliative  DVT Prophylaxis  :   - SCDs   SCDs Start: 05/17/21 1543 SCDs Start: 05/14/21 2032   Lab Results  Component Value Date   PLT 1,151 ([place]) 05/21/2021    Inpatient Medications  Scheduled Meds: . acetaminophen  650 mg Rectal QID  . docusate sodium  100 mg Oral BID   Continuous Infusions: . lactated ringers 10 mL/hr at 05/20/21 2311  . methocarbamol (ROBAXIN) IV     PRN Meds:.bisacodyl, magnesium citrate, menthol-cetylpyridinium **OR** phenol, methocarbamol **OR** methocarbamol (ROBAXIN) IV, metoCLOPramide **OR** metoCLOPramide (REGLAN) injection, morphine injection, ondansetron **OR** ondansetron (ZOFRAN) IV, traMADol    Anti-infectives (From admission, onward)   Start     Dose/Rate Route Frequency Ordered Stop   05/17/21 1930  ceFAZolin (ANCEF) IVPB 2g/100 mL premix        2 g 200 mL/hr over 30 Minutes Intravenous Every 6 hours 05/17/21 1542 05/18/21 0233   05/17/21 0600  ceFAZolin (ANCEF) IVPB 2g/100 mL premix        2 g 200 mL/hr over 30 Minutes Intravenous On call to  O.R. 05/16/21 1259 05/17/21 1322   05/15/21 0600  ceFAZolin (ANCEF) IVPB 2g/100 mL premix        2 g 200 mL/hr over 30 Minutes Intravenous On call to O.R. 05/14/21 2315 05/16/21 0559        Objective:   Vitals:   05/20/21 2028 05/21/21 0404 05/21/21 0800 05/21/21 1351  BP: (!) 158/73 (!) 146/63 (!) 154/84 (!) 132/57  Pulse: 78 95 86 73  Resp: 16 17 15    Temp: 98.1 F (36.7 C) (!) 97.4 F (36.3 C)  99.2 F (37.3 C)  TempSrc: Oral Oral  Oral  SpO2: 100% 100%  99%    Wt Readings from Last 3 Encounters:  04/17/21 45.2 kg  02/20/21 46.8 kg  01/22/21 46.6 kg     Intake/Output Summary (Last 24 hours) at 05/21/2021 1644 Last data filed at 05/21/2021 1500 Gross per 24 hour  Intake 1040.7 ml  Output 401 ml   Net 639.7 ml    Physical Exam  Gen:- Awake Alert,  In no apparent distress  HEENT:- [place].AT, No sclera icterus Neck-Supple Neck,No JVD,.  Lungs-improved air movement, no rales, no wheezing CV- S1, S2 normal, irregularly irregular  abd-  +ve B.Sounds, Abd Soft, No tenderness,    Extremity/Skin:- No  edema, pedal pulses present  Psych -underlying/baseline cognitive and memory deficits  neuro-generalized weakness without additional new focal deficits, no tremors MSK-postop wound intact,, point tenderness on palpation over Rt hip area   Data Review:   Micro Results Recent Results (from the past 240 hour(s))  Resp Panel by RT-PCR (Flu A&B, Covid) Nasopharyngeal Swab     Status: None   Collection Time: 05/14/21  6:24 PM   Specimen: Nasopharyngeal Swab; Nasopharyngeal(NP) swabs in vial transport medium  Result Value Ref Range Status   SARS Coronavirus 2 by RT PCR NEGATIVE NEGATIVE Final    Comment: (NOTE) SARS-CoV-2 target nucleic acids are NOT DETECTED.  The SARS-CoV-2 RNA is generally detectable in upper respiratory specimens during the acute phase of infection. The lowest concentration of SARS-CoV-2 viral copies this assay can detect is 138 copies/mL. A negative result does not preclude SARS-Cov-2 infection and should not be used as the sole basis for treatment or other patient management decisions. A negative result may occur with  improper specimen collection/handling, submission of specimen other than nasopharyngeal swab, presence of viral mutation(s) within the areas targeted by this assay, and inadequate number of viral copies(<138 copies/mL). A negative result must be combined with clinical observations, patient history, and epidemiological information. The expected result is Negative.  Fact Sheet for Patients:  EntrepreneurPulse.com.au  Fact Sheet for Healthcare Providers:  IncredibleEmployment.be  This test is no t yet approved or  cleared by the [place] FDA and  has been authorized for detection and/or diagnosis of SARS-CoV-2 by FDA under an Emergency Use Authorization (EUA). This EUA will remain  in effect (meaning this test can be used) for the duration of the COVID-19 declaration under Section 564(b)(1) of the Act, 21 U.S.C.section 360bbb-3(b)(1), unless the authorization is terminated  or revoked sooner.       Influenza A by PCR NEGATIVE NEGATIVE Final   Influenza B by PCR NEGATIVE NEGATIVE Final    Comment: (NOTE) The Xpert Xpress SARS-CoV-2/FLU/RSV plus assay is intended as an aid in the diagnosis of influenza from Nasopharyngeal swab specimens and should not be used as a sole basis for treatment. Nasal washings and aspirates are unacceptable for Xpert Xpress SARS-CoV-2/FLU/RSV testing.  Fact Sheet for Patients:  EntrepreneurPulse.com.au  Fact Sheet for Healthcare Providers: IncredibleEmployment.be  This test is not yet approved or cleared by the [place] FDA and has been authorized for detection and/or diagnosis of SARS-CoV-2 by FDA under an Emergency Use Authorization (EUA). This EUA will remain in effect (meaning this test can be used) for the duration of the COVID-19 declaration under Section 564(b)(1) of the Act, 21 U.S.C. section 360bbb-3(b)(1), unless the authorization is terminated or revoked.  Performed at [facility], [address] [place], [place] [address]   MRSA PCR Screening     Status: None   Collection Time: 05/14/21 10:58 PM   Specimen: Nasal Mucosa; Nasopharyngeal  Result Value Ref Range Status   MRSA by PCR NEGATIVE NEGATIVE Final    Comment:        The GeneXpert MRSA Assay (FDA approved for NASAL specimens only), is one component of a comprehensive MRSA colonization surveillance program. It is not intended to diagnose MRSA infection nor to guide or monitor treatment for MRSA infections. Performed at [facility],  [address] [place], [place] [address]   Urine Culture     Status: Abnormal   Collection Time: 05/15/21  6:01 PM   Specimen: Urine, Catheterized  Result Value Ref Range Status   Specimen Description   Final    URINE, CATHETERIZED Performed at [facility], [address], [place], [place] [address]    Special Requests   Final    Normal Performed at [facility], [address], [place], [place] [address]    Culture MULTIPLE SPECIES PRESENT, SUGGEST RECOLLECTION (A)  Final   Report Status 05/17/2021 FINAL  Final  Culture, blood (Routine X 2) w Reflex to ID Panel     Status: None   Collection Time: 05/15/21 11:26 PM   Specimen: BLOOD RIGHT FOREARM  Result Value Ref Range Status   Specimen Description BLOOD RIGHT FOREARM  Final   Special Requests   Final    BOTTLES DRAWN AEROBIC AND ANAEROBIC Blood Culture adequate volume   Culture   Final    NO GROWTH 5 DAYS Performed at [facility], [address] [place], [place] [address]    Report Status 05/20/2021 FINAL  Final  Culture, blood (Routine X 2) w Reflex to ID Panel     Status: None   Collection Time: 05/15/21 11:29 PM   Specimen: Right Antecubital; Blood  Result Value Ref Range Status   Specimen Description RIGHT ANTECUBITAL  Final   Special Requests   Final    BOTTLES DRAWN AEROBIC AND ANAEROBIC Blood Culture adequate volume   Culture   Final    NO GROWTH 5 DAYS Performed at [facility], [address] [place] [address], [place], [place] [address]    Report Status 05/20/2021 FINAL  Final  Surgical PCR screen     Status: None   Collection Time: 05/17/21  5:51 AM   Specimen: Nasal Mucosa; Nasal Swab  Result Value Ref Range Status   MRSA, PCR NEGATIVE NEGATIVE Final   Staphylococcus aureus NEGATIVE NEGATIVE Final    Comment: (NOTE) The Xpert SA Assay (FDA approved for NASAL specimens in patients 43 years of age and older), is one component of a comprehensive surveillance program. It is not intended to diagnose infection nor to guide or monitor  treatment. Performed at [facility], [address] [place], [place] [address]     Radiology Reports DG Chest 1 View  Result Date: 05/14/2021 CLINICAL DATA:  Fall, hip pain EXAM: CHEST  1 VIEW COMPARISON:  11/12/2018 FINDINGS: Heart is borderline in size. Peribronchial thickening, vascular congestion and interstitial prominence. This could reflect interstitial edema. No confluent opacities or effusions. IMPRESSION: Bronchitic changes. Suspect mild vascular congestion and interstitial edema. Electronically Signed   By: Rolm Baptise M.D.   On: 05/14/2021 18:19   CT HEAD WO CONTRAST  Result Date: 05/14/2021 CLINICAL DATA:  87 year old female with encephalopathy. EXAM: CT HEAD WITHOUT CONTRAST TECHNIQUE: Contiguous axial images were obtained from the base of the skull through the vertex without intravenous contrast. COMPARISON:  Head CT dated 05/14/2021. FINDINGS: Brain: Moderate age-related atrophy and chronic microvascular ischemic changes. There is no acute intracranial hemorrhage. No mass effect or midline shift. No extra-axial fluid collection. Vascular: No hyperdense vessel or unexpected calcification. Skull: Normal. Negative for fracture or focal lesion. Sinuses/Orbits: Right maxillary sinus retention cyst or polyp. The remainder of the visualized paranasal sinuses and mastoid air cells are clear. Other: None IMPRESSION: 1. No acute intracranial pathology. 2. Moderate age-related atrophy and chronic microvascular ischemic changes. Electronically Signed   By: Anner Crete M.D.   On: 05/14/2021 23:01   CT Head Wo Contrast  Result Date: 05/14/2021 CLINICAL DATA:  Status post fall out of bed.  Facial laceration. EXAM: CT HEAD WITHOUT CONTRAST CT CERVICAL SPINE WITHOUT CONTRAST TECHNIQUE: Multidetector CT imaging of the head and cervical spine was performed following the standard protocol without intravenous contrast. Multiplanar CT image reconstructions of the cervical spine were also generated.  COMPARISON:  Head CT 09/12/2017.  CT neck 10/10/2016. FINDINGS: CT HEAD FINDINGS Brain: No evidence of acute infarction, hemorrhage, hydrocephalus, extra-axial collection or mass lesion/mass effect. Vascular: No hyperdense vessel or unexpected calcification. Skull: Intact.  No focal lesion. Sinuses/Orbits: Wall and mucosal thickening in the right maxillary sinus consistent with chronic sinus disease appear unchanged. Mild ethmoid air cell disease on left noted. Other: None. CT CERVICAL SPINE FINDINGS Alignment: Normal. Skull base and vertebrae: No acute fracture. No primary bone lesion or focal pathologic process. Soft tissues and spinal canal: No prevertebral fluid or swelling. No visible canal hematoma. Disc levels: Mild multilevel loss of disc space height and facet arthropathy noted. Upper chest: There is interlobular septal thickening and ground-glass attenuation in the lung apices. Other: None. IMPRESSION: No acute abnormality head or cervical spine. Appearance of the lung apices suggestive of interstitial edema. Electronically Signed   By: Inge Rise M.D.   On: 05/14/2021 17:54   CT Cervical Spine Wo Contrast  Result Date: 05/14/2021 CLINICAL DATA:  Status post fall out of bed.  Facial laceration. EXAM: CT HEAD WITHOUT CONTRAST CT CERVICAL SPINE WITHOUT CONTRAST TECHNIQUE: Multidetector CT imaging of the head and cervical spine was performed following the standard protocol without intravenous contrast. Multiplanar CT image reconstructions of the cervical spine were also generated. COMPARISON:  Head CT 09/12/2017.  CT neck 10/10/2016. FINDINGS: CT HEAD FINDINGS Brain: No evidence of acute infarction, hemorrhage, hydrocephalus, extra-axial collection or mass lesion/mass effect. Vascular: No hyperdense vessel or unexpected calcification. Skull: Intact.  No focal lesion. Sinuses/Orbits: Wall and mucosal thickening in the right maxillary sinus consistent with chronic sinus disease appear unchanged. Mild  ethmoid air cell disease on left noted. Other: None. CT CERVICAL SPINE FINDINGS Alignment: Normal. Skull base and vertebrae: No acute fracture. No primary bone lesion or focal pathologic process. Soft tissues and spinal canal: No prevertebral fluid or swelling. No visible canal hematoma. Disc levels: Mild multilevel loss of disc space height and facet arthropathy noted. Upper chest: There is interlobular septal thickening and ground-glass  attenuation in the lung apices. Other: None. IMPRESSION: No acute abnormality head or cervical spine. Appearance of the lung apices suggestive of interstitial edema. Electronically Signed   By: Inge Rise M.D.   On: 05/14/2021 17:54   DG CHEST PORT 1 VIEW  Result Date: 05/16/2021 CLINICAL DATA:  87 year old female with shortness of breath. EXAM: PORTABLE CHEST 1 VIEW COMPARISON:  Portable chest 05/14/2021 and earlier. FINDINGS: Portable AP semi upright view at 0521 hours. Larger lung volumes and decreased bilateral pulmonary interstitial opacity. Lung markings appear to be near baseline now. Stable cardiac size and mediastinal contours. No definite cardiomegaly. Visualized tracheal air column is within normal limits. No pneumothorax or pleural effusion. Sequelae of thoracolumbar junction region vertebral plasty. Stable visualized osseous structures. Paucity of bowel gas in the upper abdomen. IMPRESSION: Improved lung volumes and ventilation since 05/14/2020 with resolving edema or less likely viral/atypical infection. Electronically Signed   By: Genevie Ann M.D.   On: 05/16/2021 06:34   DG CHEST PORT 1 VIEW  Result Date: 05/14/2021 CLINICAL DATA:  Unresponsive EXAM: PORTABLE CHEST 1 VIEW COMPARISON:  05/14/2021 FINDINGS: Cardiac shadow is stable. Aortic calcifications are again seen. Lungs are well aerated bilaterally. Increasing interstitial edema is noted. No confluent infiltrate or effusion is noted. Changes of prior vertebral augmentation are again seen. IMPRESSION:  Increasing pulmonary edema. Electronically Signed   By: Inez Catalina M.D.   On: 05/14/2021 23:18   ECHOCARDIOGRAM COMPLETE  Result Date: 05/15/2021    ECHOCARDIOGRAM REPORT   Patient Name:   ESTEFANI BATESON Date of Exam: 05/15/2021 Medical Rec #:  [id number]    Height:       63.0 in Accession #:    [id number]   Weight:       99.6 lb Date of Birth:  Oct 12, 1923     BSA:          1.438 m Patient Age:    26 years     BP:           150/71 mmHg Patient Gender: F            HR:           86 bpm. Exam Location:  [facility] Procedure: 2D Echo, Cardiac Doppler and Color Doppler Indications:    CHF  History:        Patient has prior history of Echocardiogram examinations, most                 recent 09/13/2017. Mitral regurgitation, Arrythmias:Atrial                 Fibrillation, Signs/Symptoms:Altered Mental Status, Dyspnea and                 Pulmonary edema; Risk Factors:Hypertension and Dyslipidemia.                 Right hip fracture.  Sonographer:    Dustin Flock RDCS Referring Phys: [phone] [facility]  Sonographer Comments: No apical window and no subcostal window. Right hip fracture and very thin body habitus. IMPRESSIONS  1. Left ventricular ejection fraction, by estimation, is 60 to 65%. The left ventricle has normal function. Left ventricular endocardial border not optimally defined to evaluate regional wall motion. Left ventricular diastolic parameters are indeterminate.  2. Right ventricular systolic function was not well visualized. The right ventricular size is not well visualized. There is normal pulmonary artery systolic pressure.  3. The mitral valve was not well visualized. Mild mitral valve  regurgitation. No evidence of mitral stenosis.  4. The aortic valve is tricuspid. Aortic valve regurgitation is not visualized. No aortic stenosis is present.  5. Limited views obtaine due to limitations on patient positioning. FINDINGS  Left Ventricle: Left ventricular ejection fraction, by estimation, is 60 to 65%.  The left ventricle has normal function. Left ventricular endocardial border not optimally defined to evaluate regional wall motion. The left ventricular internal cavity size was normal in size. There is no left ventricular hypertrophy. Left ventricular diastolic parameters are indeterminate. Right Ventricle: The right ventricular size is not well visualized. Right vetricular wall thickness was not well visualized. Right ventricular systolic function was not well visualized. There is normal pulmonary artery systolic pressure. The tricuspid regurgitant velocity is 1.82 m/s, and with an assumed right atrial pressure of 3 mmHg, the estimated right ventricular systolic pressure is 41.6 mmHg. Left Atrium: Left atrial size was not well visualized. Right Atrium: Right atrial size was not well visualized. Pericardium: There is no evidence of pericardial effusion. Mitral Valve: The mitral valve was not well visualized. Mild mitral valve regurgitation. No evidence of mitral valve stenosis. Tricuspid Valve: The tricuspid valve is not well visualized. Tricuspid valve regurgitation is mild . No evidence of tricuspid stenosis. Aortic Valve: The aortic valve is tricuspid. Aortic valve regurgitation is not visualized. No aortic stenosis is present. Pulmonic Valve: The pulmonic valve was not well visualized. Pulmonic valve regurgitation is not visualized. No evidence of pulmonic stenosis. Aorta: The aortic root is normal in size and structure. Pulmonary Artery: Indeterminant PASP, inadequate TR jet. IAS/Shunts: The interatrial septum was not well visualized.  LEFT VENTRICLE PLAX 2D LVIDd:         3.23 cm LVIDs:         2.14 cm LV PW:         0.89 cm LV IVS:        0.89 cm LVOT diam:     1.90 cm LVOT Area:     2.84 cm  LEFT ATRIUM         Index LA diam:    3.80 cm 2.64 cm/m   AORTA Ao Root diam: 2.30 cm TRICUSPID VALVE TR Peak grad:   13.2 mmHg TR Vmax:        182.00 cm/s  SHUNTS Systemic Diam: 1.90 cm Carlyle Dolly MD  Electronically signed by Carlyle Dolly MD Signature Date/Time: 05/15/2021/4:09:43 PM    Final    DG HIP OPERATIVE UNILAT WITH PELVIS RIGHT  Result Date: 05/17/2021 CLINICAL DATA:  Pinning of RIGHT hip EXAM: OPERATIVE RIGHT HIP (WITH PELVIS IF PERFORMED) 9 VIEWS TECHNIQUE: Fluoroscopic spot image(s) were submitted for interpretation post-operatively. COMPARISON:  05/14/2021 FLUOROSCOPY TIME:  1 minutes 13 seconds FINDINGS: Osseous demineralization. Images demonstrate placement of 3 cannulated screws across an impacted subcapital fracture of the RIGHT femoral neck. No dislocation. IMPRESSION: Post pinning of RIGHT femoral neck fracture. Electronically Signed   By: Lavonia Dana M.D.   On: 05/17/2021 15:30   DG Hip Unilat With Pelvis 2-3 Views Right  Result Date: 05/14/2021 CLINICAL DATA:  Fall, right hip pain EXAM: DG HIP (WITH OR WITHOUT PELVIS) 2-3V RIGHT COMPARISON:  07/18/2012 FINDINGS: There is a right femoral neck fracture noted. No subluxation or dislocation. Slight impaction. Early degenerative changes in the hips bilaterally. IMPRESSION: Right femoral neck fracture. Electronically Signed   By: Rolm Baptise M.D.   On: 05/14/2021 18:16     CBC Recent Labs  Lab 05/14/21 1721 05/14/21 2259 05/15/21 0440 05/17/21  1700 05/18/21 1749 05/19/21 0443 05/20/21 0526 05/21/21 0452  WBC 9.7 18.8*   < > 13.0* 11.3* 16.5* 13.4* 13.3*  HGB 11.5* 12.2   < > 12.0 10.9* 10.1* 10.1* 9.8*  HCT 35.3* 37.9   < > 36.9 34.3* 31.8* 32.2* 31.3*  PLT 865* 851*   < > 866* 727* 752* 905* 1,151*  MCV 102.3* 101.3*   < > 102.2* 103.3* 101.9* 104.5* 105.4*  MCH 33.3 32.6   < > 33.2 32.8 32.4 32.8 33.0  MCHC 32.6 32.2   < > 32.5 31.8 31.8 31.4 31.3  RDW 17.1* 16.9*   < > 16.8* 16.4* 16.4* 17.1* 16.9*  LYMPHSABS 1.6 2.0  --   --   --   --   --   --   MONOABS 0.8 1.2*  --   --   --   --   --   --   EOSABS 0.4 0.2  --   --   --   --   --   --   BASOSABS 0.0 0.1  --   --   --   --   --   --    < > = values in this  interval not displayed.    Chemistries  Recent Labs  Lab 05/17/21 0623 05/18/21 0639 05/19/21 0443 05/20/21 0526 05/21/21 0452  NA 140 142 142 148* 149*  K 3.9 3.3* 3.8 3.9 3.6  CL 100 106 109 111 114*  CO2 27 27 26 27 27   GLUCOSE 120* 102* 139* 82 87  BUN 53* 48* 46* 50* 50*  CREATININE 1.57* 1.34* 1.26* 1.35* 1.18*  CALCIUM 9.4 8.3* 8.6* 9.0 9.1   ------------------------------------------------------------------------------------------------------------------ No results for input(s): CHOL, HDL, LDLCALC, TRIG, CHOLHDL, LDLDIRECT in the last 72 hours.  Lab Results  Component Value Date   HGBA1C 5.7 (H) 09/13/2017   ------------------------------------------------------------------------------------------------------------------ No results for input(s): TSH, T4TOTAL, T3FREE, THYROIDAB in the last 72 hours.  Invalid input(s): FREET3 ------------------------------------------------------------------------------------------------------------------ No results for input(s): VITAMINB12, FOLATE, FERRITIN, TIBC, IRON, RETICCTPCT in the last 72 hours.  Coagulation profile Recent Labs  Lab 05/17/21 1200 05/18/21 0639 05/19/21 0443 05/20/21 0526 05/21/21 0452  INR 1.4* 1.2 1.4* 1.8* 2.1*    No results for input(s): DDIMER in the last 72 hours.  Cardiac Enzymes No results for input(s): CKMB, TROPONINI, MYOGLOBIN in the last 168 hours.  Invalid input(s): CK ------------------------------------------------------------------------------------------------------------------    Component Value Date/Time   BNP 238.0 (H) 09/12/2017 4496     Roxan Hockey M.D on 05/21/2021 at 4:44 PM  Go to [email or web address] - for contact info  [facility] - Office  [phone]

## 2021-05-22 LAB — PROTIME-INR
INR: 2.5 — ABNORMAL HIGH (ref 0.8–1.2)
Prothrombin Time: 27.3 seconds — ABNORMAL HIGH (ref 11.4–15.2)

## 2021-05-22 MED ORDER — MORPHINE SULFATE (CONCENTRATE) 10 MG /0.5 ML PO SOLN: 10.0000 mg | ORAL | 0 refills | Status: AC | PRN

## 2021-05-22 MED ORDER — ENSURE COMPLETE PO LIQD: 237.0000 mL | Freq: Two times a day (BID) | ORAL | 2 refills | Status: AC

## 2021-05-22 MED ORDER — MENTHOL 3 MG MT LOZG: 1.0000 | LOZENGE | OROMUCOSAL | 12 refills | Status: AC | PRN

## 2021-05-22 MED ORDER — HYPROMELLOSE (GONIOSCOPIC) 2.5 % OP SOLN: 1.0000 [drp] | Freq: Three times a day (TID) | OPHTHALMIC | 12 refills | Status: AC | PRN

## 2021-05-22 MED ORDER — PHENOL 1.4 % MT LIQD: 1.0000 | OROMUCOSAL | 0 refills | Status: AC | PRN

## 2021-05-22 MED ORDER — ONDANSETRON 4 MG PO TBDP: 4.0000 mg | ORAL_TABLET | Freq: Three times a day (TID) | ORAL | 0 refills | Status: AC | PRN

## 2021-05-22 MED ORDER — LORAZEPAM 2 MG/ML PO CONC: 1.0000 mg | Freq: Three times a day (TID) | ORAL | 0 refills | Status: AC | PRN

## 2021-05-22 MED ORDER — ACETAMINOPHEN 325 MG RE SUPP: 325.0000 mg | RECTAL | 1 refills | Status: AC | PRN

## 2021-05-22 NOTE — Discharge Summary (Signed)
Leslie Rosario, is a 87 y.o. female  DOB Feb 20, 1923  MRN [id number].  Admission date:  05/14/2021  Admitting Physician  Leslie Roys, MD  Discharge Date:  05/22/2021   Primary MD  Leslie Squibb, MD  Recommendations for primary care physician for things to follow:   1)Please transfer back to [place] living facility with [facility], The following 2)Please see attached MOST form for limitations to treatment 3) please do not send patient to the ED/hospital unless unable to control pain -Honey thickened liquids preferred but may have small sips of regular liquids   Admission Diagnosis  Closed right hip fracture ([place]) [S72.001A]   Discharge Diagnosis  Closed right hip fracture ([place]) [S72.001A]    Principal Problem:   Closed right hip fracture ([place]) Active Problems:   Atrial fibrillation ([place])   Long term current use of anticoagulant therapy   Acute metabolic encephalopathy   Dysphagia   Hypothyroidism   Essential hypertension   AMS (altered mental status)      Past Medical History:  Diagnosis Date  . Anxiety   . Arthritis   . Atrial fibrillation ([place])   . Constipation   . Depression   . Dyspnea   . Dysrhythmia    A-Fib  . GERD (gastroesophageal reflux disease)   . Hyperlipidemia   . Hypertension   . Hypothyroidism   . Nausea & vomiting   . T12 compression fracture ([place]) 06/2017  . Urinary frequency     Past Surgical History:  Procedure Laterality Date  . COLONOSCOPY    . HIP PINNING,CANNULATED Right 05/17/2021   Procedure: CANNULATED HIP PINNING;  Surgeon: Leslie Civil, MD;  Location: [facility];  Service: Orthopedics;  Laterality: Right;  . KYPHOPLASTY N/A 06/30/2018   Procedure: KYPHOPLASTY L1 and T12;  Surgeon: Leslie Schools, MD;  Location: [place];  Service: Orthopedics;  Laterality: N/A;  90 mins  . NM MYOCAR PERF WALL MOTION  04/2006   dipyridamole; normal  pattern of perfusion to all regions, post-stress EF 73%  . TONSILLECTOMY    . TRANSTHORACIC ECHOCARDIOGRAM  04/2012   EF=>55%, borderline conc LVH; LA mod dilated; RA mildly dilated; mild mitral annular calcif, mod MR; mild-mod TR with elevated RSVP 30-33mmHg, mild pulm HTN; mild calcif of AV leaflets, AV mildly sclerotic; aortic root sclerosis/calcif  . UPPER GASTROINTESTINAL ENDOSCOPY       HPI  from the history and physical done on the day of admission:    Chief Complaint: FALL  HPI: Leslie Rosario is a 87 y.o. female with medical history significant for atrial fibrillation, mitral regurgitation, hypertension. Was brought to the ED with reports of fall.  Patient rolled over from her bed and fell landed on her right hip.  She is complaining of pain to her nose where she has a small laceration, and right hip pain. She denies any other complaints to me.  ED Course: Blood pressure systolic 297L- 892J, otherwise stable vitals.  O2 sats 92 to 95% on room air, dropped to  90 after fentanyl was given, was placed on 4 L nasal cannula with sats improving to 98%. INR 2.7.  Head CT without acute abnormality, chest x-ray reveals mild vascular congestion, interstitial edema.  Pelvic x-ray shows right femoral neck fracture. EDP talked to Dr. Aline Rosario, will see in consult, hospitalist to reverse INR.  Review of Systems: As per HPI all other systems reviewed and negative.     Hospital Course:      Brief Summary:- 87 y.o.femalewith medical history significant foratrial fibrillation on coumadin, mitral regurgitation, HTN admitted on 05/14/2021 with Right Hip Fx after a mechanical fall, subsequently after 10 PM on 05/14/2020 developed unresponsive episode presumably after getting fentanyl in the ED previously, was found to be in flash pulmonary edema responded well to IV Lasix   A/p 1)FEN/Dysphagia/Swallowing Concerns--- speech eval appreciated, recommends Dysphagia 2 (fine chop);Honey-thick  liquid ---aspiration precautions -With honey thickened liquids patient is at risk for dehydration and readmission -With thin liquids patient is at risk for aspiration and respiratory distress -Oral intake remains pretty poor -Palliative care input appreciated -Speech pathology consult appreciated -Family would like patient to go back to [facility] assisted [facility]  hospice team following -May Need to transfer to residential hospice down the road if continues to decline - transfer back to [place] living facility with [facility] following---Please see attached MOST form for limitations to treatment   2)HFpEF--- patient appears to have chronic diastolic dysfunction CHF,  --echo from 09/13/2017 with EF of 60 to 65%, on 05/14/2021 patient had "flash pulmonary edema" after 10 PM after receiving IV fentanyl previously in the ED -- please see chest x-ray report, she responded well to IV Lasix -Repeat chest x-ray on 05/16/2021 shows resolving edema -- transfer back to [place] living facility with [facility] following---Please see attached MOST form for limitations to treatment  3)Chronic Atrial Fibrillation--hold Coumadin due to right hip bleeding - transfer back to [place] living facility with [facility] following---Please see attached MOST form for limitations to treatment  4)Chronic Anticoagulation--- in the setting of chronic A. fib, s/p right hip surgery,  -Coumadin therapy as above #3  5)AKI----acute kidney injury on CKD stage -iV   creatinine on admission= 2.07  , baseline creatinine =  1.7 to 1.8   ,  -creatinine is now=  1.3 (renal function appears to be back to baseline) - transfer back to [place] living facility with [facility] following---Please see attached MOST form for limitations to treatment  6)Social/Ethics--patient is a DNR/DNI plan of care discussed with patient's son Leslie Rosario and  Leslie Rosario at bedside --With honey thickened liquids patient is at risk for dehydration and readmission -With thin liquids patient is at risk for aspiration and respiratory distress -Family trying to make decisions regarding what they are comfortable with -on 05/20/2021--Son Leslie Rosario had face-to-face conference with myself, palliative care provider and speech pathologist -Family leaning towards comfort care and hospice final decision yet to be made --Oral intake remains challenging--poor prognosis with poor oral intake - transfer back to [place] living facility with [facility] following---Please see attached MOST form for limitations to treatment  7)Dysphagia/Swallowing concerns--- speech eval appreciated, recommends Dysphagia 2 (fine chop);Honey-thick liquid ---aspiration precautions -With honey thickened liquids patient is at risk for dehydration and readmission -With thin liquids patient is at risk for aspiration and respiratory distress   8)Right Hip Fracture--- discussed with orthopedic surgeon Dr. Arther Abbott  S/p ORIF on 05/17/2021 (CANNULATED HIP PINNING (Right) - --Family would like patient  to go back to [place] assisted living [facility]  hospice team following -We will hold Coumadin  9)Hypothyroidism---   TSH 0.935, stopped levothyroxine due to difficulty with oral intake  10)Thrombocytosis --platelets appears to be close to her baseline,Follows with Dr. Delton Coombes  discontinue hydroxyurea at discharge   Disposition- - transfer back to [place] living facility with [facility] following---Please see attached MOST form for limitations to treatment    Disposition: The patient is from: ALF [facility]  Anticipated d/c is to: -ALF with hospice following Code Status :  -  Code Status: DNR   Family Communication:   Discussed with son [name] and Richardson Landry and [name]  Consults  :  Ortho/speech/palliative  Discharge  Condition: Overall prognosis is grave,  Follow UP   Contact information for follow-up providers    [facility] Follow up.   Specialty: Assisted Living Facility Why:   Contact information: [address] [facility] Follow up.   Specialty: Hospice and Palliative Medicine Why:  to see at [place] information: [place] [phone]           Contact information for after-discharge care    [facility] .   Service: Skilled Nursing Contact information: [address] [place] [address] [phone]                   Diet and Activity recommendation:  As advised  Discharge Instructions    Discharge Instructions    Call MD for:  difficulty breathing, headache or visual disturbances   Complete by: As directed    Call MD for:  persistant dizziness or light-headedness   Complete by: As directed    Call MD for:  persistant nausea and vomiting   Complete by: As directed    Call MD for:  redness, tenderness, or signs of infection (pain, swelling, redness, odor or green/yellow discharge around incision site)   Complete by: As directed    Call MD for:  severe uncontrolled pain   Complete by: As directed    Call MD for:  temperature >100.4   Complete by: As directed    Diet general   Complete by: As directed    -Honey thickened liquids   Discharge instructions   Complete by: As directed    1)Please transfer back to [place] living facility with [facility] following 2)Please see attached MOST form for limitations to treatment 3) please do not send patient to the ED/hospital unless unable to control pain -Honey thickened liquids preferred but may have small sips of regular liquids   Discharge wound care:   Complete by: As directed    Keep Rt Hip wound clean and  dry   Increase activity slowly   Complete by: As directed        Discharge Medications     Allergies as of 05/22/2021      Reactions   Adhesive [tape] Other (See Comments)   REACTION: pulls skin   Codeine    UNKNOWN REACTION   Prednisone    UNKNOWN   Sulfonamide Derivatives    UNKNOWN REACTION   Vicodin [hydrocodone-acetaminophen]    "Intolerance "      Medication List    STOP taking these medications   ALPRAZolam 0.5 MG tablet Commonly known as: XANAX   DULoxetine 20 MG capsule Commonly known as: CYMBALTA  furosemide 20 MG tablet Commonly known as: LASIX   HYDROcodone-acetaminophen 5-325 MG tablet Commonly known as: NORCO/VICODIN   hydroxyurea 500 MG capsule Commonly known as: HYDREA   levothyroxine 88 MCG tablet Commonly known as: SYNTHROID   loratadine 5 MG chewable tablet Commonly known as: CLARITIN   Magnesium Oxide -Mg Supplement 250 MG Tabs   pantoprazole 40 MG tablet Commonly known as: PROTONIX   polyethylene glycol 17 g packet Commonly known as: MIRALAX / GLYCOLAX   potassium chloride 10 MEQ CR capsule Commonly known as: MICRO-K   prochlorperazine 5 MG tablet Commonly known as: COMPAZINE   simvastatin 10 MG tablet Commonly known as: ZOCOR   sucralfate 1 g tablet Commonly known as: CARAFATE   warfarin 3 MG tablet Commonly known as: COUMADIN   warfarin 6 MG tablet Commonly known as: COUMADIN     TAKE these medications   acetaminophen 325 MG suppository Commonly known as: TYLENOL Place 1 suppository (325 mg total) rectally every 4 (four) hours as needed.   bisacodyl 10 MG suppository Commonly known as: DULCOLAX Place 10 mg rectally every three (3) days as needed for moderate constipation.   feeding supplement Liqd Take 237 mLs by mouth 2 (two) times daily between meals. What changed: when to take this   feeding supplement (ENSURE COMPLETE) Liqd Take 237 mLs by mouth 2 (two) times daily between meals. What changed: You were  already taking a medication with the same name, and this prescription was added. Make sure you understand how and when to take each.   hydroxypropyl methylcellulose / hypromellose 2.5 % ophthalmic solution Commonly known as: ISOPTO TEARS / GONIOVISC Place 1 drop into both eyes 3 (three) times daily as needed for dry eyes (eye irritation).   LORazepam 2 MG/ML concentrated solution Commonly known as: LORazepam Intensol Take 0.5 mLs (1 mg total) by mouth every 8 (eight) hours as needed for anxiety or sleep.   menthol-cetylpyridinium 3 MG lozenge Commonly known as: CEPACOL Take 1 lozenge (3 mg total) by mouth as needed for sore throat.   morphine CONCENTRATE 10 mg / 0.5 ml concentrated solution Place 0.5 mLs (10 mg total) under the tongue every 4 (four) hours as needed for severe pain.   ondansetron 4 MG disintegrating tablet Commonly known as: Zofran ODT Take 1 tablet (4 mg total) by mouth every 8 (eight) hours as needed for nausea or vomiting.   phenol 1.4 % Liqd Commonly known as: CHLORASEPTIC Use as directed 1 spray in the mouth or throat as needed for throat irritation / pain.            Discharge Care Instructions  (From admission, onward)         Start     Ordered   05/22/21 0000  Discharge wound care:       Comments: Keep Rt Hip wound clean and dry   05/22/21 1201          Major procedures and Radiology Reports - PLEASE review detailed and final reports for all details, in brief -    DG Chest 1 View  Result Date: 05/14/2021 CLINICAL DATA:  Fall, hip pain EXAM: CHEST  1 VIEW COMPARISON:  11/12/2018 FINDINGS: Heart is borderline in size. Peribronchial thickening, vascular congestion and interstitial prominence. This could reflect interstitial edema. No confluent opacities or effusions. IMPRESSION: Bronchitic changes. Suspect mild vascular congestion and interstitial edema. Electronically Signed   By: Rolm Baptise M.D.   On: 05/14/2021 18:19   CT HEAD WO  CONTRAST  Result Date: 05/14/2021 CLINICAL DATA:  87 year old female with encephalopathy. EXAM: CT HEAD WITHOUT CONTRAST TECHNIQUE: Contiguous axial images were obtained from the base of the skull through the vertex without intravenous contrast. COMPARISON:  Head CT dated 05/14/2021. FINDINGS: Brain: Moderate age-related atrophy and chronic microvascular ischemic changes. There is no acute intracranial hemorrhage. No mass effect or midline shift. No extra-axial fluid collection. Vascular: No hyperdense vessel or unexpected calcification. Skull: Normal. Negative for fracture or focal lesion. Sinuses/Orbits: Right maxillary sinus retention cyst or polyp. The remainder of the visualized paranasal sinuses and mastoid air cells are clear. Other: None IMPRESSION: 1. No acute intracranial pathology. 2. Moderate age-related atrophy and chronic microvascular ischemic changes. Electronically Signed   By: Anner Crete M.D.   On: 05/14/2021 23:01   CT Head Wo Contrast  Result Date: 05/14/2021 CLINICAL DATA:  Status post fall out of bed.  Facial laceration. EXAM: CT HEAD WITHOUT CONTRAST CT CERVICAL SPINE WITHOUT CONTRAST TECHNIQUE: Multidetector CT imaging of the head and cervical spine was performed following the standard protocol without intravenous contrast. Multiplanar CT image reconstructions of the cervical spine were also generated. COMPARISON:  Head CT 09/12/2017.  CT neck 10/10/2016. FINDINGS: CT HEAD FINDINGS Brain: No evidence of acute infarction, hemorrhage, hydrocephalus, extra-axial collection or mass lesion/mass effect. Vascular: No hyperdense vessel or unexpected calcification. Skull: Intact.  No focal lesion. Sinuses/Orbits: Wall and mucosal thickening in the right maxillary sinus consistent with chronic sinus disease appear unchanged. Mild ethmoid air cell disease on left noted. Other: None. CT CERVICAL SPINE FINDINGS Alignment: Normal. Skull base and vertebrae: No acute fracture. No primary bone  lesion or focal pathologic process. Soft tissues and spinal canal: No prevertebral fluid or swelling. No visible canal hematoma. Disc levels: Mild multilevel loss of disc space height and facet arthropathy noted. Upper chest: There is interlobular septal thickening and ground-glass attenuation in the lung apices. Other: None. IMPRESSION: No acute abnormality head or cervical spine. Appearance of the lung apices suggestive of interstitial edema. Electronically Signed   By: Inge Rise M.D.   On: 05/14/2021 17:54   CT Cervical Spine Wo Contrast  Result Date: 05/14/2021 CLINICAL DATA:  Status post fall out of bed.  Facial laceration. EXAM: CT HEAD WITHOUT CONTRAST CT CERVICAL SPINE WITHOUT CONTRAST TECHNIQUE: Multidetector CT imaging of the head and cervical spine was performed following the standard protocol without intravenous contrast. Multiplanar CT image reconstructions of the cervical spine were also generated. COMPARISON:  Head CT 09/12/2017.  CT neck 10/10/2016. FINDINGS: CT HEAD FINDINGS Brain: No evidence of acute infarction, hemorrhage, hydrocephalus, extra-axial collection or mass lesion/mass effect. Vascular: No hyperdense vessel or unexpected calcification. Skull: Intact.  No focal lesion. Sinuses/Orbits: Wall and mucosal thickening in the right maxillary sinus consistent with chronic sinus disease appear unchanged. Mild ethmoid air cell disease on left noted. Other: None. CT CERVICAL SPINE FINDINGS Alignment: Normal. Skull base and vertebrae: No acute fracture. No primary bone lesion or focal pathologic process. Soft tissues and spinal canal: No prevertebral fluid or swelling. No visible canal hematoma. Disc levels: Mild multilevel loss of disc space height and facet arthropathy noted. Upper chest: There is interlobular septal thickening and ground-glass attenuation in the lung apices. Other: None. IMPRESSION: No acute abnormality head or cervical spine. Appearance of the lung apices suggestive  of interstitial edema. Electronically Signed   By: Inge Rise M.D.   On: 05/14/2021 17:54   DG CHEST PORT 1 VIEW  Result Date: 05/16/2021 CLINICAL DATA:  87 year old  female with shortness of breath. EXAM: PORTABLE CHEST 1 VIEW COMPARISON:  Portable chest 05/14/2021 and earlier. FINDINGS: Portable AP semi upright view at 0521 hours. Larger lung volumes and decreased bilateral pulmonary interstitial opacity. Lung markings appear to be near baseline now. Stable cardiac size and mediastinal contours. No definite cardiomegaly. Visualized tracheal air column is within normal limits. No pneumothorax or pleural effusion. Sequelae of thoracolumbar junction region vertebral plasty. Stable visualized osseous structures. Paucity of bowel gas in the upper abdomen. IMPRESSION: Improved lung volumes and ventilation since 05/14/2020 with resolving edema or less likely viral/atypical infection. Electronically Signed   By: Genevie Ann M.D.   On: 05/16/2021 06:34   DG CHEST PORT 1 VIEW  Result Date: 05/14/2021 CLINICAL DATA:  Unresponsive EXAM: PORTABLE CHEST 1 VIEW COMPARISON:  05/14/2021 FINDINGS: Cardiac shadow is stable. Aortic calcifications are again seen. Lungs are well aerated bilaterally. Increasing interstitial edema is noted. No confluent infiltrate or effusion is noted. Changes of prior vertebral augmentation are again seen. IMPRESSION: Increasing pulmonary edema. Electronically Signed   By: Inez Catalina M.D.   On: 05/14/2021 23:18   ECHOCARDIOGRAM COMPLETE  Result Date: 05/15/2021    ECHOCARDIOGRAM REPORT   Patient Name:   Leslie Rosario Date of Exam: 05/15/2021 Medical Rec #:  [id number]    Height:       63.0 in Accession #:    [id number]   Weight:       99.6 lb Date of Birth:  1923/07/10     BSA:          1.438 m Patient Age:    63 years     BP:           150/71 mmHg Patient Gender: F            HR:           86 bpm. Exam Location:  [facility] Procedure: 2D Echo, Cardiac Doppler and Color Doppler Indications:     CHF  History:        Patient has prior history of Echocardiogram examinations, most                 recent 09/13/2017. Mitral regurgitation, Arrythmias:Atrial                 Fibrillation, Signs/Symptoms:Altered Mental Status, Dyspnea and                 Pulmonary edema; Risk Factors:Hypertension and Dyslipidemia.                 Right hip fracture.  Sonographer:    Dustin Flock RDCS Referring Phys: [phone] [facility]  Sonographer Comments: No apical window and no subcostal window. Right hip fracture and very thin body habitus. IMPRESSIONS  1. Left ventricular ejection fraction, by estimation, is 60 to 65%. The left ventricle has normal function. Left ventricular endocardial border not optimally defined to evaluate regional wall motion. Left ventricular diastolic parameters are indeterminate.  2. Right ventricular systolic function was not well visualized. The right ventricular size is not well visualized. There is normal pulmonary artery systolic pressure.  3. The mitral valve was not well visualized. Mild mitral valve regurgitation. No evidence of mitral stenosis.  4. The aortic valve is tricuspid. Aortic valve regurgitation is not visualized. No aortic stenosis is present.  5. Limited views obtaine due to limitations on patient positioning. FINDINGS  Left Ventricle: Left ventricular ejection fraction, by estimation, is 60 to 65%. The left ventricle has  normal function. Left ventricular endocardial border not optimally defined to evaluate regional wall motion. The left ventricular internal cavity size was normal in size. There is no left ventricular hypertrophy. Left ventricular diastolic parameters are indeterminate. Right Ventricle: The right ventricular size is not well visualized. Right vetricular wall thickness was not well visualized. Right ventricular systolic function was not well visualized. There is normal pulmonary artery systolic pressure. The tricuspid regurgitant velocity is 1.82 m/s, and  with an assumed right atrial pressure of 3 mmHg, the estimated right ventricular systolic pressure is 74.0 mmHg. Left Atrium: Left atrial size was not well visualized. Right Atrium: Right atrial size was not well visualized. Pericardium: There is no evidence of pericardial effusion. Mitral Valve: The mitral valve was not well visualized. Mild mitral valve regurgitation. No evidence of mitral valve stenosis. Tricuspid Valve: The tricuspid valve is not well visualized. Tricuspid valve regurgitation is mild . No evidence of tricuspid stenosis. Aortic Valve: The aortic valve is tricuspid. Aortic valve regurgitation is not visualized. No aortic stenosis is present. Pulmonic Valve: The pulmonic valve was not well visualized. Pulmonic valve regurgitation is not visualized. No evidence of pulmonic stenosis. Aorta: The aortic root is normal in size and structure. Pulmonary Artery: Indeterminant PASP, inadequate TR jet. IAS/Shunts: The interatrial septum was not well visualized.  LEFT VENTRICLE PLAX 2D LVIDd:         3.23 cm LVIDs:         2.14 cm LV PW:         0.89 cm LV IVS:        0.89 cm LVOT diam:     1.90 cm LVOT Area:     2.84 cm  LEFT ATRIUM         Index LA diam:    3.80 cm 2.64 cm/m   AORTA Ao Root diam: 2.30 cm TRICUSPID VALVE TR Peak grad:   13.2 mmHg TR Vmax:        182.00 cm/s  SHUNTS Systemic Diam: 1.90 cm Carlyle Dolly MD Electronically signed by Carlyle Dolly MD Signature Date/Time: 05/15/2021/4:09:43 PM    Final    DG HIP OPERATIVE UNILAT WITH PELVIS RIGHT  Result Date: 05/17/2021 CLINICAL DATA:  Pinning of RIGHT hip EXAM: OPERATIVE RIGHT HIP (WITH PELVIS IF PERFORMED) 9 VIEWS TECHNIQUE: Fluoroscopic spot image(s) were submitted for interpretation post-operatively. COMPARISON:  05/14/2021 FLUOROSCOPY TIME:  1 minutes 13 seconds FINDINGS: Osseous demineralization. Images demonstrate placement of 3 cannulated screws across an impacted subcapital fracture of the RIGHT femoral neck. No dislocation.  IMPRESSION: Post pinning of RIGHT femoral neck fracture. Electronically Signed   By: Lavonia Dana M.D.   On: 05/17/2021 15:30   DG Hip Unilat With Pelvis 2-3 Views Right  Result Date: 05/14/2021 CLINICAL DATA:  Fall, right hip pain EXAM: DG HIP (WITH OR WITHOUT PELVIS) 2-3V RIGHT COMPARISON:  07/18/2012 FINDINGS: There is a right femoral neck fracture noted. No subluxation or dislocation. Slight impaction. Early degenerative changes in the hips bilaterally. IMPRESSION: Right femoral neck fracture. Electronically Signed   By: Rolm Baptise M.D.   On: 05/14/2021 18:16   Micro Results   Recent Results (from the past 240 hour(s))  Resp Panel by RT-PCR (Flu A&B, Covid) Nasopharyngeal Swab     Status: None   Collection Time: 05/14/21  6:24 PM   Specimen: Nasopharyngeal Swab; Nasopharyngeal(NP) swabs in vial transport medium  Result Value Ref Range Status   SARS Coronavirus 2 by RT PCR NEGATIVE NEGATIVE Final    Comment: (  NOTE) SARS-CoV-2 target nucleic acids are NOT DETECTED.  The SARS-CoV-2 RNA is generally detectable in upper respiratory specimens during the acute phase of infection. The lowest concentration of SARS-CoV-2 viral copies this assay can detect is 138 copies/mL. A negative result does not preclude SARS-Cov-2 infection and should not be used as the sole basis for treatment or other patient management decisions. A negative result may occur with  improper specimen collection/handling, submission of specimen other than nasopharyngeal swab, presence of viral mutation(s) within the areas targeted by this assay, and inadequate number of viral copies(<138 copies/mL). A negative result must be combined with clinical observations, patient history, and epidemiological information. The expected result is Negative.  Fact Sheet for Patients:  EntrepreneurPulse.com.au  Fact Sheet for Healthcare Providers:  IncredibleEmployment.be  This test is no t yet  approved or cleared by the [place] FDA and  has been authorized for detection and/or diagnosis of SARS-CoV-2 by FDA under an Emergency Use Authorization (EUA). This EUA will remain  in effect (meaning this test can be used) for the duration of the COVID-19 declaration under Section 564(b)(1) of the Act, 21 U.S.C.section 360bbb-3(b)(1), unless the authorization is terminated  or revoked sooner.       Influenza A by PCR NEGATIVE NEGATIVE Final   Influenza B by PCR NEGATIVE NEGATIVE Final    Comment: (NOTE) The Xpert Xpress SARS-CoV-2/FLU/RSV plus assay is intended as an aid in the diagnosis of influenza from Nasopharyngeal swab specimens and should not be used as a sole basis for treatment. Nasal washings and aspirates are unacceptable for Xpert Xpress SARS-CoV-2/FLU/RSV testing.  Fact Sheet for Patients: EntrepreneurPulse.com.au  Fact Sheet for Healthcare Providers: IncredibleEmployment.be  This test is not yet approved or cleared by the [place] FDA and has been authorized for detection and/or diagnosis of SARS-CoV-2 by FDA under an Emergency Use Authorization (EUA). This EUA will remain in effect (meaning this test can be used) for the duration of the COVID-19 declaration under Section 564(b)(1) of the Act, 21 U.S.C. section 360bbb-3(b)(1), unless the authorization is terminated or revoked.  Performed at [facility], [address], [place], [place] [address]   MRSA PCR Screening     Status: None   Collection Time: 05/14/21 10:58 PM   Specimen: Nasal Mucosa; Nasopharyngeal  Result Value Ref Range Status   MRSA by PCR NEGATIVE NEGATIVE Final    Comment:        The GeneXpert MRSA Assay (FDA approved for NASAL specimens only), is one component of a comprehensive MRSA colonization surveillance program. It is not intended to diagnose MRSA infection nor to guide or monitor treatment for MRSA infections. Performed at [facility], [address], [place], [place] [address]   Urine Culture     Status: Abnormal   Collection Time: 05/15/21  6:01 PM   Specimen: Urine, Catheterized  Result Value Ref Range Status   Specimen Description   Final    URINE, CATHETERIZED Performed at [facility], [address], [place], [place] [address]    Special Requests   Final    Normal Performed at [facility], [address], [place], [place] [address]    Culture MULTIPLE SPECIES PRESENT, SUGGEST RECOLLECTION (A)  Final   Report Status 05/17/2021 FINAL  Final  Culture, blood (Routine X 2) w Reflex to ID Panel     Status: None   Collection Time: 05/15/21 11:26 PM   Specimen: BLOOD RIGHT FOREARM  Result Value Ref Range Status   Specimen Description  BLOOD RIGHT FOREARM  Final   Special Requests   Final    BOTTLES DRAWN AEROBIC AND ANAEROBIC Blood Culture adequate volume   Culture   Final    NO GROWTH 5 DAYS Performed at [facility], [address] [place], [place] [address]    Report Status 05/20/2021 FINAL  Final  Culture, blood (Routine X 2) w Reflex to ID Panel     Status: None   Collection Time: 05/15/21 11:29 PM   Specimen: Right Antecubital; Blood  Result Value Ref Range Status   Specimen Description RIGHT ANTECUBITAL  Final   Special Requests   Final    BOTTLES DRAWN AEROBIC AND ANAEROBIC Blood Culture adequate volume   Culture   Final    NO GROWTH 5 DAYS Performed at [facility], [address] [place], [place] [address]    Report Status 05/20/2021 FINAL  Final  Surgical PCR screen     Status: None   Collection Time: 05/17/21  5:51 AM   Specimen: Nasal Mucosa; Nasal Swab  Result Value Ref Range Status   MRSA, PCR NEGATIVE NEGATIVE Final   Staphylococcus aureus NEGATIVE NEGATIVE Final    Comment: (NOTE) The Xpert SA Assay (FDA approved for NASAL specimens in patients 58 years of age and older), is one component of a comprehensive surveillance program. It is not intended to diagnose infection nor to guide  or monitor treatment. Performed at [facility], [address] [place], [place] [address]    Today   Subjective    Leslie Rosario today is resting comfortably -Son Richardson Landry at bedside -- transfer back to [place] living facility with [facility] following---Please see attached MOST form for limitations to treatment         Patient has been seen and examined prior to discharge   Objective   Blood pressure (!) 145/74, pulse 93, temperature 97.7 F (36.5 C), resp. rate 18, SpO2 97 %.   Intake/Output Summary (Last 24 hours) at 05/22/2021 1205 Last data filed at 05/22/2021 0100 Gross per 24 hour  Intake 505 ml  Output 251 ml  Net 254 ml    Exam Gen:- Awake Alert,  In no apparent distress  HEENT:- [place].AT, No sclera icterus Neck-Supple Neck,No JVD,.  Lungs-improved air movement, no rales, no wheezing CV- S1, S2 normal, irregularly irregular  abd-  +ve B.Sounds, Abd Soft, No tenderness,    Extremity/Skin:- No  edema, pedal pulses present  Psych -underlying/baseline cognitive and memory deficits  neuro-generalized weakness without additional new focal deficits, no tremors MSK-postop wound intact,, point tenderness on palpation over Rt hip area   Data Review   CBC w Diff:  Lab Results  Component Value Date   WBC 13.3 (H) 05/21/2021   HGB 9.8 (L) 05/21/2021   HGB 13.2 08/24/2018   HCT 31.3 (L) 05/21/2021   HCT 41.7 08/24/2018   PLT 1,151 (HH) 05/21/2021   PLT 588 (H) 08/24/2018   LYMPHOPCT 11 05/14/2021   MONOPCT 6 05/14/2021   EOSPCT 1 05/14/2021   BASOPCT 0 05/14/2021    CMP:  Lab Results  Component Value Date   NA 149 (H) 05/21/2021   NA 140 08/24/2018   K 3.6 05/21/2021   CL 114 (H) 05/21/2021   CO2 27 05/21/2021   BUN 50 (H) 05/21/2021   BUN 19 08/24/2018   CREATININE 1.18 (H) 05/21/2021   PROT 6.7 04/11/2021   ALBUMIN 3.7 04/11/2021   BILITOT 0.6 04/11/2021   ALKPHOS 56 04/11/2021   AST  24 04/11/2021   ALT 15 04/11/2021  .  Total  Discharge time is about 33 minutes  Roxan Hockey M.D on 05/22/2021 at 12:05 PM  Go to [email or web address] -  for contact info  [facility] - Office  [phone]

## 2021-05-22 NOTE — Care Management Important Message (Signed)
Important Message  Patient Details  Name: Leslie Rosario MRN: [id number] Date of Birth: 07/17/1923   Medicare Important Message Given:  Yes     Tommy Medal 05/22/2021, 12:05 PM

## 2021-05-22 NOTE — TOC Transition Note (Signed)
Transition of Care [facility]) - CM/SW Discharge Note   Patient Details  Name: Leslie Rosario MRN: [id number] Date of Birth: September 10, 1923  [facility] CM/SW Contact:  Boneta Lucks, RN Phone Number: 05/22/2021, 11:10 AM   Clinical Narrative:   [place] called hospital bed and oxygen is being set up at [facility] at this time. Medical necessity completed RN to call report. TOC updated family. Most form completed.     Final next level of care: Other (comment) ([place] with Hospice) Barriers to Discharge: Continued Medical Work up   Patient Goals and CMS Choice Patient states their goals for this hospitalization and ongoing recovery are:: to go to rehab CMS Medicare.gov Compare Post Acute Care list provided to:: Patient Represenative (must comment) Choice offered to / list presented to : Adult Children  Discharge Placement              Patient chooses bed at:  ([facility]) Patient to be transferred to facility by: EMS Name of family member notified: Octavia Bruckner- son Patient and family notified of of transfer: 05/22/21  Discharge Plan and [place] [facility]/ [place]    Readmission Risk Interventions Readmission Risk Prevention Plan 05/21/2021 05/16/2021  Transportation Screening - Complete  PCP or Specialist Appt within 3-5 Days Complete -  HRI or [place] - Complete  Social Work Consult for [place] Planning/Counseling - Complete  Palliative Care Screening - Not Applicable  Medication Review Press photographer) - Complete  Some recent data might be hidden

## 2021-05-22 NOTE — Discharge Instructions (Signed)
1)Please transfer back to [place] living facility with [facility] following 2)Please see attached MOST form for limitations to treatment 3) please do not send patient to the ED/hospital unless unable to control pain -Honey thickened liquids preferred but may have small sips of regular liquids

## 2021-05-22 NOTE — Progress Notes (Signed)
Patient discharged from room accompanied by EMS

## 2021-05-27 ENCOUNTER — Encounter (HOSPITAL_COMMUNITY): Payer: Self-pay | Admitting: Orthopedic Surgery

## 2021-06-14 DEATH — deceased
